# Patient Record
Sex: Female | Born: 1940 | Race: Black or African American | Hispanic: No | Marital: Married | Smoking: Former smoker
Health system: Southern US, Community
[De-identification: ages and names within clinical notes are randomized; demographics above are authoritative.]

## PROBLEM LIST (undated history)

## (undated) DIAGNOSIS — M25649 Stiffness of unspecified hand, not elsewhere classified: Secondary | ICD-10-CM

## (undated) DIAGNOSIS — F419 Anxiety disorder, unspecified: Secondary | ICD-10-CM

## (undated) DIAGNOSIS — I10 Essential (primary) hypertension: Secondary | ICD-10-CM

## (undated) DIAGNOSIS — E1136 Type 2 diabetes mellitus with diabetic cataract: Secondary | ICD-10-CM

## (undated) DIAGNOSIS — S40029A Contusion of unspecified upper arm, initial encounter: Secondary | ICD-10-CM

## (undated) DIAGNOSIS — E785 Hyperlipidemia, unspecified: Secondary | ICD-10-CM

## (undated) DIAGNOSIS — K222 Esophageal obstruction: Secondary | ICD-10-CM

## (undated) DIAGNOSIS — I2510 Atherosclerotic heart disease of native coronary artery without angina pectoris: Secondary | ICD-10-CM

## (undated) DIAGNOSIS — E119 Type 2 diabetes mellitus without complications: Secondary | ICD-10-CM

## (undated) DIAGNOSIS — R5383 Other fatigue: Secondary | ICD-10-CM

## (undated) DIAGNOSIS — M1990 Unspecified osteoarthritis, unspecified site: Secondary | ICD-10-CM

## (undated) DIAGNOSIS — N189 Chronic kidney disease, unspecified: Secondary | ICD-10-CM

## (undated) DIAGNOSIS — R12 Heartburn: Secondary | ICD-10-CM

## (undated) DIAGNOSIS — D649 Anemia, unspecified: Secondary | ICD-10-CM

## (undated) DIAGNOSIS — Z8041 Family history of malignant neoplasm of ovary: Secondary | ICD-10-CM

## (undated) DIAGNOSIS — I219 Acute myocardial infarction, unspecified: Secondary | ICD-10-CM

## (undated) DIAGNOSIS — K449 Diaphragmatic hernia without obstruction or gangrene: Secondary | ICD-10-CM

## (undated) DIAGNOSIS — Z9289 Personal history of other medical treatment: Secondary | ICD-10-CM

## (undated) DIAGNOSIS — R0600 Dyspnea, unspecified: Secondary | ICD-10-CM

## (undated) HISTORY — DX: Diaphragmatic hernia without obstruction or gangrene: K44.9

## (undated) HISTORY — DX: Atherosclerotic heart disease of native coronary artery without angina pectoris: I25.10

## (undated) HISTORY — DX: Type 2 diabetes mellitus with diabetic cataract: E11.36

## (undated) HISTORY — DX: Hyperlipidemia, unspecified: E78.5

## (undated) HISTORY — DX: Contusion of unspecified upper arm, initial encounter: S40.029A

## (undated) HISTORY — DX: Family history of malignant neoplasm of ovary: Z80.41

## (undated) HISTORY — PX: PARTIAL HYSTERECTOMY: SHX80

## (undated) HISTORY — DX: Stiffness of unspecified hand, not elsewhere classified: M25.649

## (undated) HISTORY — DX: Personal history of other medical treatment: Z92.89

## (undated) HISTORY — DX: Other fatigue: R53.83

## (undated) HISTORY — DX: Type 2 diabetes mellitus without complications: E11.9

## (undated) HISTORY — DX: Unspecified osteoarthritis, unspecified site: M19.90

## (undated) HISTORY — DX: Esophageal obstruction: K22.2

## (undated) HISTORY — DX: Heartburn: R12

## (undated) HISTORY — DX: Anemia, unspecified: D64.9

## (undated) HISTORY — PX: CATARACT EXTRACTION: SUR2

---

## 1996-03-31 HISTORY — PX: CHOLECYSTECTOMY: SHX55

## 2002-12-30 HISTORY — PX: CORONARY ANGIOPLASTY WITH STENT PLACEMENT: SHX49

## 2002-12-30 HISTORY — PX: CARDIAC CATHETERIZATION: SHX172

## 2005-03-23 ENCOUNTER — Emergency Department (HOSPITAL_COMMUNITY): Admission: EM | Admit: 2005-03-23 | Discharge: 2005-03-23 | Payer: Self-pay | Admitting: *Deleted

## 2005-05-16 HISTORY — PX: OTHER SURGICAL HISTORY: SHX169

## 2005-08-06 ENCOUNTER — Ambulatory Visit: Payer: Self-pay | Admitting: Family Medicine

## 2005-10-06 ENCOUNTER — Ambulatory Visit: Payer: Self-pay | Admitting: Family Medicine

## 2005-10-08 ENCOUNTER — Ambulatory Visit: Payer: Self-pay | Admitting: Family Medicine

## 2006-01-08 ENCOUNTER — Observation Stay (HOSPITAL_COMMUNITY): Admission: EM | Admit: 2006-01-08 | Discharge: 2006-01-08 | Payer: Self-pay | Admitting: Emergency Medicine

## 2006-02-12 ENCOUNTER — Ambulatory Visit: Payer: Self-pay | Admitting: Family Medicine

## 2006-02-12 ENCOUNTER — Encounter (INDEPENDENT_AMBULATORY_CARE_PROVIDER_SITE_OTHER): Payer: Self-pay | Admitting: Nurse Practitioner

## 2006-02-13 ENCOUNTER — Encounter (INDEPENDENT_AMBULATORY_CARE_PROVIDER_SITE_OTHER): Payer: Self-pay | Admitting: Nurse Practitioner

## 2006-02-13 LAB — CONVERTED CEMR LAB
Cholesterol: 207 mg/dL
LDL Cholesterol: 128 mg/dL
VLDL: 22 mg/dL

## 2006-02-16 ENCOUNTER — Ambulatory Visit: Payer: Self-pay | Admitting: Family Medicine

## 2006-02-23 ENCOUNTER — Encounter: Admission: RE | Admit: 2006-02-23 | Discharge: 2006-02-23 | Payer: Self-pay | Admitting: Family Medicine

## 2006-02-24 ENCOUNTER — Ambulatory Visit: Payer: Self-pay | Admitting: Family Medicine

## 2006-12-04 ENCOUNTER — Ambulatory Visit: Payer: Self-pay | Admitting: Internal Medicine

## 2006-12-04 ENCOUNTER — Encounter (INDEPENDENT_AMBULATORY_CARE_PROVIDER_SITE_OTHER): Payer: Self-pay | Admitting: Nurse Practitioner

## 2006-12-04 LAB — CONVERTED CEMR LAB
ALT: 15 units/L (ref 0–35)
Alkaline Phosphatase: 52 units/L (ref 39–117)
Basophils Absolute: 0 10*3/uL (ref 0.0–0.1)
CO2: 22 meq/L (ref 19–32)
Cholesterol: 180 mg/dL (ref 0–200)
Creatinine, Ser: 1.21 mg/dL — ABNORMAL HIGH (ref 0.40–1.20)
Eosinophils Absolute: 0.3 10*3/uL (ref 0.0–0.7)
Eosinophils Relative: 8 % — ABNORMAL HIGH (ref 0–5)
HCT: 36 % (ref 36.0–46.0)
Hemoglobin: 11.5 g/dL — ABNORMAL LOW (ref 12.0–15.0)
LDL Cholesterol: 92 mg/dL (ref 0–99)
MCHC: 31.9 g/dL (ref 30.0–36.0)
MCV: 89.1 fL (ref 78.0–100.0)
Monocytes Absolute: 0.3 10*3/uL (ref 0.2–0.7)
Platelets: 181 10*3/uL (ref 150–400)
RDW: 14.4 % — ABNORMAL HIGH (ref 11.5–14.0)
Sodium: 142 meq/L (ref 135–145)
Total Bilirubin: 0.3 mg/dL (ref 0.3–1.2)
Total CHOL/HDL Ratio: 2.6
Total Protein: 7.4 g/dL (ref 6.0–8.3)
Triglycerides: 101 mg/dL (ref <150)
VLDL: 20 mg/dL (ref 0–40)

## 2006-12-11 ENCOUNTER — Encounter (INDEPENDENT_AMBULATORY_CARE_PROVIDER_SITE_OTHER): Payer: Self-pay | Admitting: Nurse Practitioner

## 2006-12-11 DIAGNOSIS — E1139 Type 2 diabetes mellitus with other diabetic ophthalmic complication: Secondary | ICD-10-CM

## 2006-12-11 DIAGNOSIS — I10 Essential (primary) hypertension: Secondary | ICD-10-CM

## 2006-12-11 DIAGNOSIS — I2510 Atherosclerotic heart disease of native coronary artery without angina pectoris: Secondary | ICD-10-CM

## 2006-12-11 DIAGNOSIS — E785 Hyperlipidemia, unspecified: Secondary | ICD-10-CM | POA: Insufficient documentation

## 2006-12-11 DIAGNOSIS — R5383 Other fatigue: Secondary | ICD-10-CM

## 2006-12-11 DIAGNOSIS — R5381 Other malaise: Secondary | ICD-10-CM | POA: Insufficient documentation

## 2006-12-11 DIAGNOSIS — E1136 Type 2 diabetes mellitus with diabetic cataract: Secondary | ICD-10-CM | POA: Insufficient documentation

## 2006-12-11 DIAGNOSIS — E119 Type 2 diabetes mellitus without complications: Secondary | ICD-10-CM

## 2006-12-25 ENCOUNTER — Ambulatory Visit: Payer: Self-pay | Admitting: Nurse Practitioner

## 2007-02-26 ENCOUNTER — Encounter: Admission: RE | Admit: 2007-02-26 | Discharge: 2007-02-26 | Payer: Self-pay | Admitting: Family Medicine

## 2007-04-12 ENCOUNTER — Encounter (INDEPENDENT_AMBULATORY_CARE_PROVIDER_SITE_OTHER): Payer: Self-pay | Admitting: Nurse Practitioner

## 2007-05-06 ENCOUNTER — Ambulatory Visit: Payer: Self-pay | Admitting: Nurse Practitioner

## 2007-05-06 LAB — CONVERTED CEMR LAB
Blood Glucose, Fingerstick: 114
Hgb A1c MFr Bld: 6.2 %

## 2007-08-05 ENCOUNTER — Ambulatory Visit: Payer: Self-pay | Admitting: Nurse Practitioner

## 2007-08-05 LAB — CONVERTED CEMR LAB
ALT: 19 units/L (ref 0–35)
AST: 24 units/L (ref 0–37)
Alkaline Phosphatase: 46 units/L (ref 39–117)
BUN: 21 mg/dL (ref 6–23)
Basophils Absolute: 0 10*3/uL (ref 0.0–0.1)
Basophils Relative: 0 % (ref 0–1)
Blood Glucose, Fingerstick: 86
Calcium: 10.1 mg/dL (ref 8.4–10.5)
Chloride: 103 meq/L (ref 96–112)
Creatinine, Ser: 1.12 mg/dL (ref 0.40–1.20)
Eosinophils Absolute: 0.2 10*3/uL (ref 0.0–0.7)
Eosinophils Relative: 3 % (ref 0–5)
HCT: 37.7 % (ref 36.0–46.0)
HDL: 94 mg/dL (ref >39)
Hemoglobin: 12 g/dL (ref 12.0–15.0)
LDL Goal: 70 mg/dL
MCHC: 31.8 g/dL (ref 30.0–36.0)
MCV: 90.2 fL (ref 78.0–100.0)
Monocytes Absolute: 0.4 10*3/uL (ref 0.1–1.0)
Monocytes Relative: 7 % (ref 3–12)
RBC: 4.18 M/uL (ref 3.87–5.11)
RDW: 14.4 % (ref 11.5–15.5)
Total Bilirubin: 0.4 mg/dL (ref 0.3–1.2)
Total CHOL/HDL Ratio: 2
VLDL: 14 mg/dL (ref 0–40)

## 2007-08-06 ENCOUNTER — Encounter (INDEPENDENT_AMBULATORY_CARE_PROVIDER_SITE_OTHER): Payer: Self-pay | Admitting: Nurse Practitioner

## 2007-09-16 ENCOUNTER — Encounter (INDEPENDENT_AMBULATORY_CARE_PROVIDER_SITE_OTHER): Payer: Self-pay | Admitting: Nurse Practitioner

## 2007-12-14 ENCOUNTER — Ambulatory Visit: Payer: Self-pay | Admitting: Nurse Practitioner

## 2007-12-14 LAB — CONVERTED CEMR LAB
Blood Glucose, Fingerstick: 92
Hgb A1c MFr Bld: 6.6 %

## 2008-01-18 ENCOUNTER — Encounter (INDEPENDENT_AMBULATORY_CARE_PROVIDER_SITE_OTHER): Payer: Self-pay | Admitting: Nurse Practitioner

## 2008-02-07 ENCOUNTER — Observation Stay (HOSPITAL_COMMUNITY): Admission: EM | Admit: 2008-02-07 | Discharge: 2008-02-08 | Payer: Self-pay | Admitting: Emergency Medicine

## 2008-02-07 ENCOUNTER — Ambulatory Visit: Payer: Self-pay | Admitting: Cardiovascular Disease

## 2008-02-08 ENCOUNTER — Encounter (INDEPENDENT_AMBULATORY_CARE_PROVIDER_SITE_OTHER): Payer: Self-pay | Admitting: Nurse Practitioner

## 2008-02-28 ENCOUNTER — Encounter: Admission: RE | Admit: 2008-02-28 | Discharge: 2008-02-28 | Payer: Self-pay | Admitting: Internal Medicine

## 2008-03-08 ENCOUNTER — Encounter (INDEPENDENT_AMBULATORY_CARE_PROVIDER_SITE_OTHER): Payer: Self-pay | Admitting: Nurse Practitioner

## 2008-03-08 DIAGNOSIS — D649 Anemia, unspecified: Secondary | ICD-10-CM

## 2008-03-14 ENCOUNTER — Ambulatory Visit: Payer: Self-pay | Admitting: Nurse Practitioner

## 2008-03-14 DIAGNOSIS — M129 Arthropathy, unspecified: Secondary | ICD-10-CM | POA: Insufficient documentation

## 2008-03-14 LAB — CONVERTED CEMR LAB

## 2008-03-16 ENCOUNTER — Ambulatory Visit: Payer: Self-pay | Admitting: Cardiovascular Disease

## 2008-03-20 ENCOUNTER — Encounter: Admission: RE | Admit: 2008-03-20 | Discharge: 2008-03-20 | Payer: Self-pay | Admitting: Internal Medicine

## 2008-03-20 DIAGNOSIS — E559 Vitamin D deficiency, unspecified: Secondary | ICD-10-CM | POA: Insufficient documentation

## 2008-03-20 LAB — CONVERTED CEMR LAB
Basophils Absolute: 0 10*3/uL (ref 0.0–0.1)
Eosinophils Absolute: 0.3 10*3/uL (ref 0.0–0.7)
Eosinophils Relative: 5 % (ref 0–5)
HCT: 38.1 % (ref 36.0–46.0)
Hemoglobin: 12.1 g/dL (ref 12.0–15.0)
Lymphocytes Relative: 46 % (ref 12–46)
Lymphs Abs: 2.1 10*3/uL (ref 0.7–4.0)
MCV: 89.2 fL (ref 78.0–100.0)
Monocytes Absolute: 0.3 10*3/uL (ref 0.1–1.0)
RDW: 14.9 % (ref 11.5–15.5)

## 2008-03-27 ENCOUNTER — Encounter (INDEPENDENT_AMBULATORY_CARE_PROVIDER_SITE_OTHER): Payer: Self-pay | Admitting: Nurse Practitioner

## 2008-06-15 ENCOUNTER — Ambulatory Visit: Payer: Self-pay | Admitting: Nurse Practitioner

## 2008-06-15 DIAGNOSIS — R32 Unspecified urinary incontinence: Secondary | ICD-10-CM | POA: Insufficient documentation

## 2008-06-15 DIAGNOSIS — M25649 Stiffness of unspecified hand, not elsewhere classified: Secondary | ICD-10-CM | POA: Insufficient documentation

## 2008-06-15 LAB — CONVERTED CEMR LAB
Bilirubin Urine: NEGATIVE
Blood in Urine, dipstick: NEGATIVE
Glucose, Urine, Semiquant: NEGATIVE
Microalb, Ur: 0.5 mg/dL (ref 0.00–1.89)
Protein, U semiquant: NEGATIVE
pH: 5

## 2008-06-16 ENCOUNTER — Encounter (INDEPENDENT_AMBULATORY_CARE_PROVIDER_SITE_OTHER): Payer: Self-pay | Admitting: Nurse Practitioner

## 2008-06-16 LAB — CONVERTED CEMR LAB
Rhuematoid fact SerPl-aCnc: <20 intl units/mL (ref 0–20)
Sed Rate: 14 mm/hr (ref 0–22)
Uric Acid, Serum: 6.6 mg/dL (ref 2.4–7.0)

## 2008-06-19 ENCOUNTER — Encounter (INDEPENDENT_AMBULATORY_CARE_PROVIDER_SITE_OTHER): Payer: Self-pay | Admitting: Nurse Practitioner

## 2008-06-23 ENCOUNTER — Telehealth (INDEPENDENT_AMBULATORY_CARE_PROVIDER_SITE_OTHER): Payer: Self-pay | Admitting: Nurse Practitioner

## 2008-07-13 ENCOUNTER — Encounter (INDEPENDENT_AMBULATORY_CARE_PROVIDER_SITE_OTHER): Payer: Self-pay | Admitting: Nurse Practitioner

## 2008-07-20 ENCOUNTER — Ambulatory Visit: Payer: Self-pay | Admitting: Nurse Practitioner

## 2008-07-20 DIAGNOSIS — S40029A Contusion of unspecified upper arm, initial encounter: Secondary | ICD-10-CM | POA: Insufficient documentation

## 2008-11-03 ENCOUNTER — Ambulatory Visit: Payer: Self-pay | Admitting: Nurse Practitioner

## 2008-11-03 DIAGNOSIS — K5900 Constipation, unspecified: Secondary | ICD-10-CM | POA: Insufficient documentation

## 2008-11-03 LAB — CONVERTED CEMR LAB
Albumin: 4.4 g/dL (ref 3.5–5.2)
Alkaline Phosphatase: 48 units/L (ref 39–117)
BUN: 23 mg/dL (ref 6–23)
Basophils Absolute: 0 10*3/uL (ref 0.0–0.1)
Basophils Relative: 0 % (ref 0–1)
Eosinophils Relative: 5 % (ref 0–5)
Glucose, Bld: 70 mg/dL (ref 70–99)
HCT: 34.5 % — ABNORMAL LOW (ref 36.0–46.0)
HDL: 75 mg/dL (ref >39)
Hemoglobin: 11 g/dL — ABNORMAL LOW (ref 12.0–15.0)
LDL Cholesterol: 75 mg/dL (ref 0–99)
Lymphocytes Relative: 43 % (ref 12–46)
MCHC: 31.9 g/dL (ref 30.0–36.0)
MCV: 92.2 fL (ref 78.0–100.0)
Monocytes Absolute: 0.4 10*3/uL (ref 0.1–1.0)
Potassium: 4 meq/L (ref 3.5–5.3)
RDW: 14.2 % (ref 11.5–15.5)
Triglycerides: 79 mg/dL (ref <150)

## 2008-11-06 ENCOUNTER — Encounter (INDEPENDENT_AMBULATORY_CARE_PROVIDER_SITE_OTHER): Payer: Self-pay | Admitting: Nurse Practitioner

## 2009-01-30 ENCOUNTER — Ambulatory Visit: Payer: Self-pay | Admitting: Nurse Practitioner

## 2009-03-06 ENCOUNTER — Ambulatory Visit: Payer: Self-pay | Admitting: Cardiovascular Disease

## 2009-04-25 ENCOUNTER — Emergency Department (HOSPITAL_COMMUNITY)
Admission: EM | Admit: 2009-04-25 | Discharge: 2009-04-25 | Payer: Self-pay | Source: Home / Self Care | Admitting: Emergency Medicine

## 2009-05-10 ENCOUNTER — Encounter: Admission: RE | Admit: 2009-05-10 | Discharge: 2009-05-10 | Payer: Self-pay | Admitting: Internal Medicine

## 2009-05-31 ENCOUNTER — Ambulatory Visit: Payer: Self-pay | Admitting: Nurse Practitioner

## 2009-05-31 DIAGNOSIS — Z780 Asymptomatic menopausal state: Secondary | ICD-10-CM | POA: Insufficient documentation

## 2009-05-31 DIAGNOSIS — M25519 Pain in unspecified shoulder: Secondary | ICD-10-CM

## 2009-05-31 DIAGNOSIS — N3000 Acute cystitis without hematuria: Secondary | ICD-10-CM

## 2009-05-31 LAB — CONVERTED CEMR LAB
Bilirubin Urine: NEGATIVE
Blood in Urine, dipstick: NEGATIVE
Glucose, Urine, Semiquant: NEGATIVE
Hgb A1c MFr Bld: 6.5 %
Ketones, urine, test strip: NEGATIVE
Nitrite: POSITIVE
Specific Gravity, Urine: <1.005
pH: 7

## 2009-06-01 ENCOUNTER — Encounter (INDEPENDENT_AMBULATORY_CARE_PROVIDER_SITE_OTHER): Payer: Self-pay | Admitting: Nurse Practitioner

## 2009-06-01 LAB — CONVERTED CEMR LAB: Microalb, Ur: 0.5 mg/dL (ref 0.00–1.89)

## 2009-08-10 ENCOUNTER — Emergency Department (HOSPITAL_COMMUNITY): Admission: EM | Admit: 2009-08-10 | Discharge: 2009-08-10 | Payer: Self-pay | Admitting: Emergency Medicine

## 2009-11-05 ENCOUNTER — Telehealth (INDEPENDENT_AMBULATORY_CARE_PROVIDER_SITE_OTHER): Payer: Self-pay | Admitting: Nurse Practitioner

## 2009-11-13 ENCOUNTER — Encounter (INDEPENDENT_AMBULATORY_CARE_PROVIDER_SITE_OTHER): Payer: Self-pay | Admitting: Nurse Practitioner

## 2009-12-10 ENCOUNTER — Telehealth (INDEPENDENT_AMBULATORY_CARE_PROVIDER_SITE_OTHER): Payer: Self-pay | Admitting: Nurse Practitioner

## 2009-12-13 ENCOUNTER — Telehealth (INDEPENDENT_AMBULATORY_CARE_PROVIDER_SITE_OTHER): Payer: Self-pay | Admitting: Nurse Practitioner

## 2009-12-14 ENCOUNTER — Telehealth (INDEPENDENT_AMBULATORY_CARE_PROVIDER_SITE_OTHER): Payer: Self-pay | Admitting: Nurse Practitioner

## 2010-01-31 ENCOUNTER — Telehealth (INDEPENDENT_AMBULATORY_CARE_PROVIDER_SITE_OTHER): Payer: Self-pay | Admitting: Nurse Practitioner

## 2010-02-04 ENCOUNTER — Telehealth (INDEPENDENT_AMBULATORY_CARE_PROVIDER_SITE_OTHER): Payer: Self-pay | Admitting: Nurse Practitioner

## 2010-02-13 ENCOUNTER — Ambulatory Visit: Payer: Self-pay | Admitting: Nurse Practitioner

## 2010-02-13 LAB — CONVERTED CEMR LAB
Blood Glucose, Fingerstick: 79
Microalb, Ur: 0.5 mg/dL (ref 0.00–1.89)

## 2010-03-07 ENCOUNTER — Encounter: Payer: Self-pay | Admitting: Cardiovascular Disease

## 2010-03-07 ENCOUNTER — Ambulatory Visit: Payer: Self-pay | Admitting: Cardiovascular Disease

## 2010-03-15 ENCOUNTER — Ambulatory Visit: Payer: Self-pay | Admitting: Nurse Practitioner

## 2010-03-27 ENCOUNTER — Telehealth (INDEPENDENT_AMBULATORY_CARE_PROVIDER_SITE_OTHER): Payer: Self-pay | Admitting: Radiology

## 2010-03-28 ENCOUNTER — Ambulatory Visit (HOSPITAL_COMMUNITY)
Admission: RE | Admit: 2010-03-28 | Discharge: 2010-03-28 | Payer: Self-pay | Source: Home / Self Care | Attending: Cardiovascular Disease | Admitting: Cardiovascular Disease

## 2010-03-28 ENCOUNTER — Ambulatory Visit: Payer: Self-pay

## 2010-04-21 ENCOUNTER — Encounter: Payer: Self-pay | Admitting: Internal Medicine

## 2010-04-30 NOTE — Medication Information (Signed)
Summary: DIABETIC SUPPLIES  DIABETIC SUPPLIES   Imported By: Roland Earl 11/13/2009 10:59:47  _____________________________________________________________________  External Attachment:    Type:   Image     Comment:   External Document

## 2010-04-30 NOTE — Letter (Signed)
Summary: TEST ORDER FORM //DEXA SCAN//APPT DATE & TIME  TEST ORDER FORM //DEXA SCAN//APPT DATE & TIME   Imported By: Roland Earl 07/26/2009 13:00:31  _____________________________________________________________________  External Attachment:    Type:   Image     Comment:   External Document

## 2010-04-30 NOTE — Progress Notes (Signed)
Summary: CHECKING ON OLD REF  Phone Note Call from Patient Call back at Home Phone [phone]   Reason for Call: Referral Summary of Call: [facility] pt. MR Nowack STOPPED BY WITH OUR REFERRAL PAPER FROM OUR OFFICE FOR HIS WIFE TO GO TO [facility] 06/07/[facility] AT 9:15 FOR A APPT. FOR POSTMENOPAUSAL STATUS. HE SAYS SHE DIDNT GO  BECAUSE SHE WAS NOT FEELING WELL, BUT WHEN I CALLED [facility], THEY SAID SHE NEVER HAD AN APPT. FOR THIS. Initial call taken by: Roberto Scales,  December 13, 2009 10:56 AM  Follow-up for Phone Call        forward to N. Hassell Done, fnp Follow-up by: Isla Pence,  December 14, 2009 12:43 PM  Additional Follow-up for Phone Call Additional follow up Details #1::        Another phone note open regarding this issue - requested that pt schedule an appt. i will discuss with her then Additional Follow-up by: Aurora Mask FNP,  December 14, 2009 3:05 PM    Additional Follow-up for Phone Call Additional follow up Details #2::    Isla Pence  December 17, 2009 4:41 PM Left message on machine for pt to return call to the office.  [facility]  December 17, 2009 5:37 PM   Isla Pence  December 18, 2009 2:29 PM Left message with person that answered to have pt return call to  the office. pt aware Rhunette Croft  December 19, 2009 9:29 AM

## 2010-04-30 NOTE — Progress Notes (Signed)
Summary: Query:  Refill Ibuprofen?  Phone Note Outgoing Call   Summary of Call: Refill Ibuprofen? And if so, how many refills?  Last seen 5/11. Initial call taken by: Sherian Maroon RN,  February 04, 2010 5:26 PM  Follow-up for Phone Call        ok to refill.  pt should have an upcoming appt Follow-up by: Aurora Mask FNP,  February 04, 2010 5:37 PM  Additional Follow-up for Phone Call Additional follow up Details #1::        Noted.  Has appt. 02/13/10.  Refill completed.  Kynzlee Kassner RN  February 04, 2010 5:45 PM
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
## 2010-04-30 NOTE — Progress Notes (Signed)
Summary: Query:  Refill Miralax?  Phone Note Outgoing Call   Summary of Call: Do you want her Miralax refilled?  Last seen 05/2009.  Jacquella Qiao RN  December 10, 2009 1:00 PM   Follow-up for Phone Call        yes, ok to refill Follow-up by: Aurora Mask FNP,  December 10, 2009 2:15 PM  Additional Follow-up for Phone Call Additional follow up Details #1::        Noted and refilled.  Rawda Benally RN  December 10, 2009 2:23 PM

## 2010-04-30 NOTE — Progress Notes (Signed)
Summary: Needs metformin and oxybutynin refilled  Phone Note Outgoing Call   Call placed by: Sherian Maroon RN,  November 05, 2009 11:11 AM Summary of Call: Do you want to refill her oxybutynin and metformin?  Has not been in since 05/2009 Initial call taken by: Sherian Maroon RN,  November 05, 2009 11:12 AM  Follow-up for Phone Call        yes, ok to refill Follow-up by: Aurora Mask FNP,  November 05, 2009 12:27 PM    Prescriptions: OXYBUTYNIN CHLORIDE 5 MG TABS (OXYBUTYNIN CHLORIDE) One tablet by mouth two times a day for bladder  #60 Tablet x 4   Entered by:   Sherian Maroon RN   Authorized by:   Aurora Mask FNP   Signed by:   Sherian Maroon RN on 11/05/2009   Method used:   Electronically to        [facility] [address] [phone]* (retail)       [address]       [place], [place]  [address]       Ph: [phone] or [phone]       Fax: [phone]   RxID:   [id number]
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
## 2010-04-30 NOTE — Assessment & Plan Note (Signed)
Summary: f1y   Visit Type:  1  year follow up Primary Provider:  Aurora Mask FNP  CC:  Some chest pains.  History of Present Illness: 70 yr old woman with hx of CAD and prior stenting who presents today for follow-up evaluation. She describes chest pain with heavy exertion or emotional stress. This is longstanding and has not worsened since she was seen last year. She had a negative Myoview stress test at that time.  She has taken nitroglycerin on 3 occasions since her visit last year. She denies dyspnea, orthopnea, PND, palpitations, lightheadedness, or syncope. She reports good control of her lipids and blood pressure.  Reports chest pain with emotional stress, pattern unchanged from previous years. Also 'tiredness' with exertion. No clear exertional chest pain but does have exertional dyspnea. She has taken about 5 sublingual NTG over the past year and this provides relief of her symptoms. No edema, palpitations, or other complaints.  Current Medications (verified): 1)  Crestor 10 Mg  Tabs (Rosuvastatin Calcium) .... Take 1 Tablet By Mouth Nightly For Cholesterol 2)  Nitroglycerin 0.4 Mg Subl (Nitroglycerin) .... Take One Tablet At Onset of Chest Pain. May Repeat Every 5 Minutes Up To 3 Dose. If Unreleaved Cal 911 3)  Metformin Hcl 500 Mg  Tabs (Metformin Hcl) .... Take 1 Tablet By Mouth Once A Day 4)  Lisinopril-Hydrochlorothiazide 20-12.5 Mg  Tabs (Lisinopril-Hydrochlorothiazide) .... Take 1 Tablet By Mouth Every Morning 5)  Multivitamins   Tabs (Multiple Vitamin) .Marland Kitchen.. 1 Tablet By Mouth Daily 6)  Ecotrin Low Strength 81 Mg  Tbec (Aspirin) .Marland Kitchen.. 1 Tablet By Mouth Daily 7)  Miralax   Powd (Polyethylene Glycol 3350) .Marland Kitchen.. 17gm in 4-8oz of Water or Juice Daily For Stools 8)  Triamcinolone Acetonide 0.1 % Crea (Triamcinolone Acetonide) .Marland Kitchen.. 1 Application Topically Daily To Affected Area 9)  Oxybutynin Chloride 5 Mg Tabs (Oxybutynin Chloride) .... One Tablet By Mouth Two Times A Day For  Bladder 10)  Tylenol Extra Strength 500 Mg Tabs (Acetaminophen) .... One Tablet By Mouth Two Times A Day  Allergies: 1)  ! Pcn  Past History:  Past medical history reviewed for relevance to current acute and chronic problems.  Past Medical History: Reviewed history from 03/02/2009 and no changes required. 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 HEENT: normal Neck: normal carotid upstrokes without bruits, JVP normal Lungs: CTA CV: RRR without murmur or gallop Abd: soft, NT, positive BS, no bruit, no organomegaly Ext: no clubbing, cyanosis, or edema. peripheral pulses 2+ and equal Skin: warm and dry without rash    EKG  Procedure date:  03/07/2010  Findings:      NSR with LVH, HR 77 bpm. nonspecific T wave abnormality  Impression & Recommendations:  Problem # 1:  CORONARY ARTERY DISEASE (ICD-414.00) The patient has chest pain with typical and atypical features. This occurs in a stable pattern. With history of CAD and stenting, recommended a stress echo to rule out significant ischemia. Otherwise  continue current medical program.  Her updated medication list for this problem includes:    Nitroglycerin 0.4 Mg Subl (Nitroglycerin) .Marland Kitchen... Take one tablet at onset of chest pain. may repeat every 5 minutes up to 3 dose. if unreleaved cal 911    Lisinopril-hydrochlorothiazide 20-12.5 Mg Tabs (Lisinopril-hydrochlorothiazide) .Marland Kitchen... Take 1 tablet by mouth every morning    Ecotrin Low Strength 81 Mg Tbec (Aspirin) .Marland Kitchen... 1 tablet by mouth daily  Orders: EKG w/ Interpretation (93000) Stress Echo (Stress Echo)  Problem # 2:  HYPERTENSION (ICD-401.9)  well-controlled.  Her updated medication list for this problem includes:    Lisinopril-hydrochlorothiazide 20-12.5 Mg Tabs (Lisinopril-hydrochlorothiazide) .Marland Kitchen... Take 1 tablet by mouth every morning    Ecotrin Low Strength 81 Mg Tbec (Aspirin) .Marland Kitchen... 1 tablet by mouth daily  BP today: 124/88 Prior BP: 104/76 (02/13/2010)  Prior 10 Yr Risk Heart Disease: N/A (12/25/2006)  Labs Reviewed: K+: 4.0 (11/03/2008) Creat: : 1.28 (11/03/2008)   Chol: 166 (11/03/2008)   HDL: 75 (11/03/2008)   LDL: 75 (11/03/2008)   TG: 79 (11/03/2008)  Orders: EKG w/ Interpretation (93000) Stress Echo (Stress Echo)  Problem # 3:  HYPERLIPIDEMIA (ICD-272.4)  Followed by PCP and has been at goal on statin Rx.  Her updated medication list for this problem includes:    Crestor 10 Mg Tabs (Rosuvastatin calcium) .Marland Kitchen... Take 1 tablet by mouth nightly for cholesterol  CHOL: 166 (11/03/2008)   LDL: 75 (11/03/2008)   HDL: 75 (11/03/2008)   TG: 79 (11/03/2008) CHOL (goal): 200 (08/05/2007)   LDL (goal): 70 (08/05/2007)   HDL (goal): 40 (08/05/2007)   TG (goal): 150 (08/05/2007)  Orders: EKG w/ Interpretation (93000) Stress Echo (Stress Echo)  Patient Instructions: 1)  Your physician recommends that you continue on your current medications as directed. Please refer to the Current Medication list given to you today. 2)  Your physician wants you to follow-up in: 1 YEAR.   You will receive a reminder letter in the mail two months in advance. If you don't receive a letter, please call our office to schedule the follow-up appointment. 3)  Your physician has requested that you have a stress echocardiogram. For further information please visit [email or web address].  Please follow instruction sheet as given.

## 2010-04-30 NOTE — Progress Notes (Signed)
Summary: query Lisinopril/HCT refill  Phone Note Outgoing Call   Summary of Call: Do you want to refill Lisinopril/HCTZ last seen 05/2009? Initial call taken by: Bridgett Larsson RN,  January 31, 2010 11:28 AM  Follow-up for Phone Call        ok to refill Follow-up by: Aurora Mask FNP,  January 31, 2010 11:42 AM  Additional Follow-up for Phone Call Additional follow up Details #1::        refill sent electronically [place] RN  January 31, 2010 2:14 PM     Prescriptions: LISINOPRIL-HYDROCHLOROTHIAZIDE 20-12.5 MG  TABS (LISINOPRIL-HYDROCHLOROTHIAZIDE) Take 1 tablet by mouth every morning  #30 Tablet x 3   Entered by:   Bridgett Larsson RN   Authorized by:   Aurora Mask FNP   Signed by:   Bridgett Larsson RN on 01/31/2010   Method used:   [facility]        [facility] [address] [phone]* (retail)       [address]       [place], [place]  [address]       Ph: [phone] or [phone]       Fax: [phone]   RxID:   [id number]

## 2010-04-30 NOTE — Assessment & Plan Note (Signed)
Summary: Diabetes   Vital Signs:  Patient profile:   70 year old female Menstrual status:  postmenopausal Weight:      136.7 pounds BMI:     27.71 Temp:     97.6 degrees F oral Pulse rate:   88 / minute Pulse rhythm:   regular Resp:     20 per minute BP sitting:   104 / 76  (left arm) Cuff size:   regular  Vitals Entered By: Isla Pence (February 13, 2010 11:21 AM)  Nutrition Counseling: Patient's BMI is greater than 25 and therefore counseled on weight management options. CC: follow-up visit DM...pain in left ear, some sorethroat, and joint pain x 3 weeks, Hypertension Management, Lipid Management Is Patient Diabetic? Yes Pain Assessment Patient in pain? yes     Location: joints, ear CBG Result 79 CBG Device ID B  Does patient need assistance? Functional Status Self care Ambulation Normal   Primary Care Provider:  Aurora Mask FNP  CC:  follow-up visit DM...pain in left ear, some sorethroat, and joint pain x 3 weeks, Hypertension Management, and Lipid Management.  History of Present Illness:  Pt into the office for f/u on diabetes  Diabetes Management History:      The patient is a 70 years old female who comes in for evaluation of Type 2 Diabetes Mellitus.  She has not been enrolled in the "Diabetic Education Program".  She states understanding of dietary principles and is following her diet appropriately.  No sensory loss is reported.  Self foot exams are not being performed.  She is checking home blood sugars.  She says that she is not exercising regularly.        Hypoglycemic symptoms are not occurring.  No hyperglycemic symptoms are reported.        No changes have been made to her treatment plan since last visit.    Hypertension History:      She denies headache, chest pain, and palpitations.  She notes no problems with any antihypertensive medication side effects.        Positive major cardiovascular risk factors include female age 53 years old or  older, diabetes, hyperlipidemia, and hypertension.  Negative major cardiovascular risk factors include non-tobacco-user status.        Positive history for target organ damage include ASHD (either angina/prior MI/prior CABG).  Further assessment for target organ damage reveals no history of cardiac end-organ damage (CHF/LVH), stroke/TIA, peripheral vascular disease, renal insufficiency, or hypertensive retinopathy.    Lipid Management History:      Positive NCEP/ATP III risk factors include female age 53 years old or older, diabetes, hypertension, and ASHD (either angina/prior MI/prior CABG).  Negative NCEP/ATP III risk factors include no history of early menopause without estrogen hormone replacement, HDL cholesterol greater than 60, non-tobacco-user status, no prior stroke/TIA, no peripheral vascular disease, and no history of aortic aneurysm.        The patient states that she knows about the "Therapeutic Lifestyle Change" diet.  Her compliance with the TLC diet is fair.  The patient does not know about adjunctive measures for cholesterol lowering.  She expresses no side effects from her lipid-lowering medication.  Comments include: pt is not fasting today for labs.  The patient denies any symptoms to suggest myopathy or liver disease.      Habits & Providers  Alcohol-Tobacco-Diet     Alcohol drinks/day: <1     Alcohol type: wine     >5/day in last 3 mos: holidays  Tobacco Status: never  Exercise-Depression-Behavior     Does Patient Exercise: no     Drug Use: never     Seat Belt Use: always     Sun Exposure: frequent  Allergies (verified): 1)  ! Pcn  Review of Systems General:  Denies fever. CV:  Denies chest pain or discomfort. GI:  Complains of abdominal pain; denies nausea and vomiting; c/o some mid abdominal irritation that is intermittent.  She is currently out of ibuprofen but admits that she has not felt the problems with her stomach. . MS:  Complains of joint  pain.  Physical Exam  General:  alert.   Head:  normocephalic.   Ears:  ear piercing(s) noted.   Lungs:  normal breath sounds.   Heart:  normal rate and regular rhythm.   Abdomen:  normal bowel sounds.   Msk:  up to the exam table Neurologic:  alert & oriented X3.    Diabetes Management Exam:    Foot Exam (with socks and/or shoes not present):       Sensory-Monofilament:          Left foot: normal          Right foot: normal   Impression & Recommendations:  Problem # 1:  DIABETES MELLITUS, TYPE II (ICD-250.00) Hgba1c = 6.5 pt to continue current medications Her updated medication list for this problem includes:    Metformin Hcl 500 Mg Tabs (Metformin hcl) .Marland Kitchen... Take 1 tablet by mouth once a day    Lisinopril-hydrochlorothiazide 20-12.5 Mg Tabs (Lisinopril-hydrochlorothiazide) .Marland Kitchen... Take 1 tablet by mouth every morning    Ecotrin Low Strength 81 Mg Tbec (Aspirin) .Marland Kitchen... 1 tablet by mouth daily  Orders: UA Dipstick w/o Micro (manual) (81002) T-Urine Microalbumin w/creat. ratio [id number])  Problem # 2:  HYPERTENSION (ICD-401.9) BP is doing well Continue DASH diet Her updated medication list for this problem includes:    Lisinopril-hydrochlorothiazide 20-12.5 Mg Tabs (Lisinopril-hydrochlorothiazide) .Marland Kitchen... Take 1 tablet by mouth every morning  Problem # 3:  NEED PROPHYLACTIC VACCINATION&INOCULATION FLU (ICD-V04.81) given today in office  Problem # 4:  HYPERLIPIDEMIA (ICD-272.4) pt to f/u in 1 month for cholesterol Her updated medication list for this problem includes:    Crestor 10 Mg Tabs (Rosuvastatin calcium) .Marland Kitchen... Take 1 tablet by mouth nightly for cholesterol  Problem # 5:  JOINT STIFFNESS, HAND (ICD-719.54) advised pt to take tylenol instead of ibuprofen  Complete Medication List: 1)  Crestor 10 Mg Tabs (Rosuvastatin calcium) .... Take 1 tablet by mouth nightly for cholesterol 2)  Nitroglycerin 0.4 Mg Subl (Nitroglycerin) .... Take one tablet at onset of  chest pain. may repeat every 5 minutes up to 3 dose. if unreleaved cal 911 3)  Metformin Hcl 500 Mg Tabs (Metformin hcl) .... Take 1 tablet by mouth once a day 4)  Lisinopril-hydrochlorothiazide 20-12.5 Mg Tabs (Lisinopril-hydrochlorothiazide) .... Take 1 tablet by mouth every morning 5)  Multivitamins Tabs (Multiple vitamin) .Marland Kitchen.. 1 tablet by mouth daily 6)  Ecotrin Low Strength 81 Mg Tbec (Aspirin) .Marland Kitchen.. 1 tablet by mouth daily 7)  Miralax Powd (Polyethylene glycol 3350) .Marland Kitchen.. 17gm in 4-8oz of water or juice daily for stools 8)  Triamcinolone Acetonide 0.1 % Crea (Triamcinolone acetonide) .Marland Kitchen.. 1 application topically daily to affected area 9)  Oxybutynin Chloride 5 Mg Tabs (Oxybutynin chloride) .... One tablet by mouth two times a day for bladder 10)  Tylenol Extra Strength 500 Mg Tabs (Acetaminophen) .... One tablet by mouth two times a day  Other Orders: Flu  Vaccine 66yrs + [phone]) Admin 1st Vaccine [phone])  Diabetes Management Assessment/Plan:      The following lipid goals have been established for the patient: Total cholesterol goal of 200; LDL cholesterol goal of 70; HDL cholesterol goal of 40; Triglyceride goal of 150.  Her blood pressure goal is < 130/80.    Hypertension Assessment/Plan:      The patient's hypertensive risk group is category C: Target organ damage and/or diabetes.  Today's blood pressure is 104/76.  Her blood pressure goal is < 130/80.  Lipid Assessment/Plan:      Based on NCEP/ATP III, the patient's risk factor category is "history of coronary disease, peripheral vascular disease, cerebrovascular disease, or aortic aneurysm along with either diabetes, current smoker, or LDL > 130 plus HDL < 40 plus triglycerides > 200".  The patient's lipid goals are as follows: Total cholesterol goal is 200; LDL cholesterol goal is 70; HDL cholesterol goal is 40; Triglyceride goal is 150.    Patient Instructions: 1)  You have recieved the flu vaccine today. 2)  You will need to  schedule a fasting lab visit for lipids, cmet, cbc, tsh.  No food after midnight before this visit. 3)  Joint pain - STOP taking ibuprofen as this may be irritating your stomach.  Take tylenol Extra Strength 500mg  - may take one tablet by mouth two times a day OR Two tablets daily in the morning. 4)  Follow up in 3 months for diabetes and high blood pressure or sooner if necessary. Prescriptions: NITROGLYCERIN 0.4 MG SUBL (NITROGLYCERIN) Take one tablet at onset of chest pain. May repeat every 5 minutes up to 3 dose. If unreleaved cal 911  #25 x 0   Entered and Authorized by:   Aurora Mask FNP   Signed by:   Aurora Mask FNP on 02/13/2010   Method used:   [facility]        [facility] [address] [phone]* (retail)       [address]       [place], [place]  [address]       Ph: [phone] or [phone]       Fax: [phone]   RxID:   [id number]    Orders Added: 1)  Flu Vaccine 10yrs + [id number] 2)  Admin 1st Vaccine [id number] 3)  Est. Patient Level III [id number] 4)  UA Dipstick w/o Micro (manual) [81002] 5)  T-Urine Microalbumin w/creat. ratio [[id number]]   Immunizations Administered:  Influenza Vaccine # 1:    Vaccine Type: Fluvax 3+    Site: right deltoid    Mfr: GlaxoSmithKline    Dose: 0.5 ml    Route: IM    Given by: Isla Pence    Exp. Date: 09/28/2010    Lot #: [id number]    VIS given: 10/23/09 version given February 13, 2010.  Flu Vaccine Consent Questions:    Do you have a history of severe allergic reactions to this vaccine? no    Any prior history of allergic reactions to egg and/or gelatin? no    Do you have a sensitivity to the preservative Thimersol? no    Do you have a past history of Guillan-Barre Syndrome? no    Do you currently have an acute febrile illness? no    Have you ever had a severe reaction to latex? no    Vaccine information given and explained to patient? yes    Are you currently pregnant? no  ndc  [phone]  Immunizations Administered:  Influenza Vaccine # 1:    Vaccine Type: Fluvax 3+    Site: right deltoid    Mfr: GlaxoSmithKline    Dose: 0.5 ml    Route: IM    Given by: Isla Pence    Exp. Date: 09/28/2010    Lot #: [id number]    VIS given: 10/23/09 version given February 13, 2010.    Last LDL:                                                 75 (11/03/2008 9:37:00 PM)          Diabetic Foot Exam    10-g (5.07) Semmes-Weinstein Monofilament Test Performed by: Isla Pence          Right Foot          Left Foot Visual Inspection               Test Control      normal         normal Site 1         normal         normal Site 2         normal         normal Site 3         normal         normal Site 4         normal         normal Site 5         normal         normal Site 6         normal         normal Site 7         normal         normal Site 8         normal         normal Site 9         normal         normal Site 10         normal         normal  Impression      normal         normal   Appended Document: Diabetes     Allergies: 1)  ! Pcn   Complete Medication List: 1)  Crestor 10 Mg Tabs (Rosuvastatin calcium) .... Take 1 tablet by mouth nightly for cholesterol 2)  Nitroglycerin 0.4 Mg Subl (Nitroglycerin) .... Take one tablet at onset of chest pain. may repeat every 5 minutes up to 3 dose. if unreleaved cal 911 3)  Metformin Hcl 500 Mg Tabs (Metformin hcl) .... Take 1 tablet by mouth once a day 4)  Lisinopril-hydrochlorothiazide 20-12.5 Mg Tabs (Lisinopril-hydrochlorothiazide) .... Take 1 tablet by mouth every morning 5)  Multivitamins Tabs (Multiple vitamin) .Marland Kitchen.. 1 tablet by mouth daily 6)  Ecotrin Low Strength 81 Mg Tbec (Aspirin) .Marland Kitchen.. 1 tablet by mouth daily 7)  Miralax Powd (Polyethylene glycol 3350) .Marland Kitchen.. 17gm in 4-8oz of water or juice daily for stools 8)  Triamcinolone Acetonide 0.1 % Crea (Triamcinolone acetonide) .Marland Kitchen.. 1  application topically daily to affected area 9)  Oxybutynin Chloride 5 Mg Tabs (Oxybutynin chloride) .... One tablet by mouth two times  a day for bladder 10)  Tylenol Extra Strength 500 Mg Tabs (Acetaminophen) .... One tablet by mouth two times a day      Laboratory Results   Urine Tests  Date/Time Received: February 13, 2010 1:14 PM   Routine Urinalysis   Color: lt. yellow Appearance: Clear Glucose: negative   (Normal Range: Negative) Bilirubin: negative   (Normal Range: Negative) Ketone: negative   (Normal Range: Negative) Spec. Gravity: <1.005   (Normal Range: 1.003-1.035) Blood: negative   (Normal Range: Negative) pH: 6.0   (Normal Range: 5.0-8.0) Protein: negative   (Normal Range: Negative) Urobilinogen: 0.2   (Normal Range: 0-1) Nitrite: negative   (Normal Range: Negative) Leukocyte Esterace: negative   (Normal Range: Negative)

## 2010-05-02 NOTE — Progress Notes (Signed)
Summary: stress echo pre-procedure  Phone Note Outgoing Call   Call placed by: Charlton Amor, CNMT,  March 27, 2010 3:04 PM Call placed to: Patient Reason for Call: Confirm/change Appt Summary of Call: Spoke with patient about stress echo appt.

## 2010-05-07 ENCOUNTER — Telehealth (INDEPENDENT_AMBULATORY_CARE_PROVIDER_SITE_OTHER): Payer: Self-pay | Admitting: *Deleted

## 2010-05-07 ENCOUNTER — Other Ambulatory Visit: Payer: Self-pay | Admitting: Nurse Practitioner

## 2010-05-07 DIAGNOSIS — Z1231 Encounter for screening mammogram for malignant neoplasm of breast: Secondary | ICD-10-CM

## 2010-05-08 ENCOUNTER — Encounter: Payer: Self-pay | Admitting: Cardiology

## 2010-05-08 ENCOUNTER — Ambulatory Visit (HOSPITAL_COMMUNITY): Payer: Medicare Other | Attending: Cardiovascular Disease

## 2010-05-08 DIAGNOSIS — I2510 Atherosclerotic heart disease of native coronary artery without angina pectoris: Secondary | ICD-10-CM | POA: Insufficient documentation

## 2010-05-08 DIAGNOSIS — I4949 Other premature depolarization: Secondary | ICD-10-CM

## 2010-05-08 DIAGNOSIS — I491 Atrial premature depolarization: Secondary | ICD-10-CM

## 2010-05-08 DIAGNOSIS — R079 Chest pain, unspecified: Secondary | ICD-10-CM

## 2010-05-15 ENCOUNTER — Ambulatory Visit
Admission: RE | Admit: 2010-05-15 | Discharge: 2010-05-15 | Disposition: A | Discharge status: Home / Self Care | Payer: Medicare Other | Source: Ambulatory Visit | Attending: Nurse Practitioner | Admitting: Nurse Practitioner

## 2010-05-15 DIAGNOSIS — Z1231 Encounter for screening mammogram for malignant neoplasm of breast: Secondary | ICD-10-CM

## 2010-05-16 NOTE — Progress Notes (Signed)
Summary: nuc pre procedure  Phone Note Outgoing Call Call back at Home Phone [phone]   Call placed by: Matilde Haymaker RN,  May 07, 2010 2:47 PM Call placed to: Patient Reason for Call: Confirm/change Appt Summary of Call: Reviewed information on Myoview Information Sheet (see scanned document for further details).  Spoke with patient.      Nuclear Med Background Indications for Stress Test: Evaluation for Ischemia, Stent Patency   History: Echo, Heart Catheterization, Myocardial Infarction, Myocardial Perfusion Study, Stents  History Comments: 04 MI,Cath and stent([place]) 09 MPS EF=nl 03/28/10 Stress Echo EF=nl  Symptoms: Chest Pain with Exertion, Chest Tightness with Exertion, DOE, Fatigue, Near Syncope    Nuclear Pre-Procedure Cardiac Risk Factors: Family History - CAD, Hypertension, Lipids, NIDDM Height (in): 32

## 2010-05-16 NOTE — Assessment & Plan Note (Addendum)
Summary: Cardiology Nuclear Testing  Nuclear Med Background Indications for Stress Test: Evaluation for Ischemia, Stent Patency   History: Echo, Heart Catheterization, Myocardial Infarction, Myocardial Perfusion Study, Stents  History Comments: 04 MI,Cath and stent([place]) 09 MPS EF=nl 03/28/10 Stress Echo EF=nl  Symptoms: Chest Pain with Exertion, Chest Tightness with Exertion, DOE, Fatigue, Near Syncope    Nuclear Pre-Procedure Cardiac Risk Factors: Family History - CAD, Hypertension, Lipids, NIDDM Caffeine/Decaff Intake: None NPO After: 8:30 PM Lungs: clear IV 0.9% NS with Angio Cath: 22g     IV Site: L Antecubital IV Started by: Irven Baltimore, RN Chest Size (in) 34-36      Cup Size D     Height (in): 59 Weight (lb): 136 BMI: 27.57  Nuclear Med Study 1 or 2 day study:  1 day     Stress Test Type:  Treadmill/Lexiscan Reading MD:  Loralie Champagne, MD     Referring MD:  M.Cooper Resting Radionuclide:  Technetium 102m Tetrofosmin     Resting Radionuclide Dose:  11.0 mCi  Stress Radionuclide:  Technetium 43m Tetrofosmin     Stress Radionuclide Dose:  33.0 mCi   Stress Protocol  Max Systolic BP: 0000000 mm Hg Lexiscan: 0.4 mg   Stress Test Technologist:  Perrin Maltese, EMT-P     Nuclear Technologist:  Charlton Amor, CNMT  Rest Procedure  Myocardial perfusion imaging was performed at rest 45 minutes following the intravenous administration of Technetium 56m Tetrofosmin.  Stress Procedure  The patient received IV Lexiscan 0.4 mg over 15-seconds with concurrent low level exercise and then Technetium 10m Tetrofosmin was injected at 30-seconds while the patient continued walking one more minute.  There were no significant changes and rare pacs/pvcs with Lexiscan.  Quantitative spect images were obtained after a 45 minute delay.  QPS Raw Data Images:  Normal; no motion artifact; normal heart/lung ratio. Stress Images:  Small apical perfusion defect.  Rest Images:  Normal  homogeneous uptake in all areas of the myocardium. Subtraction (SDS):  Small reversible apical perfusion defect.  Transient Ischemic Dilatation:  1.15  (Normal <1.22)  Lung/Heart Ratio:  0.39  (Normal <0.45)  Quantitative Gated Spect Images QGS EDV:  64 ml QGS ESV:  26 ml QGS EF:  59 % QGS cine images:  Normal wall motion.    Overall Impression  Exercise Capacity: Lexiscan with low level exercise BP Response: Normal blood pressure response. Clinical Symptoms: Short of breath.  ECG Impression: No significant ST segment change suggestive of ischemia. Overall Impression: Small reversible apical perfusion defect that represents ischemia versus shifting breast artifact.  Low risk study.  Normal EF.   Appended Document: Cardiology Nuclear Testing reviewed result of both Myoview and stress echo. Poor exercise tolerance noted. Both studies are abnormal but appear to be low-risk. Would continue with medical therapy unless chest pattern accelerates - has been stable in past.   Appended Document: Cardiology Nuclear Testing Lauren - let's schedule a f/u visit in 3 months to make sure CP pattern is stable. thx (Per Dr Burt Knack)  I have left messages for this pt to call back about results without a return call.  I attempted to reach her by phone today before mailing letter and she answered the phone. Pt aware of results by phone.  The pt said her CP symptoms have decreased since appt. Appt scheduled with Dr Burt Knack on 07/31/10.

## 2010-05-16 NOTE — Assessment & Plan Note (Addendum)
Summary: r/s myoview wt 137 dx 414.00/medicare/medicaid/sl  Nuclear Med Background Indications for Stress Test: Evaluation for Ischemia, Stent Patency   History: Echo, Heart Catheterization, Myocardial Infarction, Myocardial Perfusion Study, Stents  History Comments: 04 MI,Cath and stent([place]) 09 MPS EF=nl 03/28/10 Stress Echo EF=nl  Symptoms: Chest Pain with Exertion, Chest Tightness with Exertion, DOE, Fatigue, Near Syncope    Nuclear Pre-Procedure Cardiac Risk Factors: Family History - CAD, Hypertension, Lipids, NIDDM Height (in): 84  Nuclear Med Study  QPS Raw Data Images:  Normal; no motion artifact; normal heart/lung ratio. Stress Images:  Small apical perfusion defect.  Rest Images:  Normal homogeneous uptake in all areas of the myocardium. Subtraction (SDS):  Small reversible apical perfusion defect.    Quantitative Gated Spect Images QGS EF:  59% % QGS cine images:  Normal wall motion.    Overall Impression  Exercise Capacity: Lexiscan with low level exercise BP Response: Normal blood pressure response. Clinical Symptoms: Short of breath, headache.  ECG Impression: No significant ST segment change suggestive of ischemia.  Baseline artifact.  Overall Impression: Small reversible apical perfusion defect.  This could represent ischemia (versus shifting breast attenuation).  Low risk study.

## 2010-05-17 ENCOUNTER — Encounter (INDEPENDENT_AMBULATORY_CARE_PROVIDER_SITE_OTHER): Payer: Self-pay | Admitting: Nurse Practitioner

## 2010-05-22 NOTE — Letter (Signed)
Summary: [facility] MED[facility]PPLIES  MED-CARE DIABETIC & MEDICAL SUPPLIES   Imported By: Roland Earl 05/17/2010 08:23:36  _____________________________________________________________________  External Attachment:    Type:   Image     Comment:   External Document

## 2010-05-23 ENCOUNTER — Encounter (INDEPENDENT_AMBULATORY_CARE_PROVIDER_SITE_OTHER): Payer: Self-pay | Admitting: Nurse Practitioner

## 2010-05-23 ENCOUNTER — Encounter: Payer: Self-pay | Admitting: Nurse Practitioner

## 2010-05-23 ENCOUNTER — Other Ambulatory Visit (HOSPITAL_COMMUNITY): Payer: Self-pay | Admitting: Internal Medicine

## 2010-05-23 DIAGNOSIS — L0291 Cutaneous abscess, unspecified: Secondary | ICD-10-CM | POA: Insufficient documentation

## 2010-05-23 DIAGNOSIS — L0390 Cellulitis, unspecified: Secondary | ICD-10-CM

## 2010-05-23 LAB — CONVERTED CEMR LAB
Albumin: 4 g/dL (ref 3.5–5.2)
Alkaline Phosphatase: 51 units/L (ref 39–117)
BUN: 20 mg/dL (ref 6–23)
Blood Glucose, Fingerstick: 98
Creatinine, Ser: 1.22 mg/dL — ABNORMAL HIGH (ref 0.40–1.20)
Eosinophils Absolute: 0.3 10*3/uL (ref 0.0–0.7)
Eosinophils Relative: 6 % — ABNORMAL HIGH (ref 0–5)
Glucose, Bld: 70 mg/dL (ref 70–99)
HCT: 36.6 % (ref 36.0–46.0)
Lymphs Abs: 1.8 10*3/uL (ref 0.7–4.0)
MCHC: 31.7 g/dL (ref 30.0–36.0)
MCV: 92 fL (ref 78.0–100.0)
Monocytes Absolute: 0.4 10*3/uL (ref 0.1–1.0)
Monocytes Relative: 8 % (ref 3–12)
RBC: 3.98 M/uL (ref 3.87–5.11)
Total Bilirubin: 0.4 mg/dL (ref 0.3–1.2)
WBC: 4.4 10*3/uL (ref 4.0–10.5)

## 2010-05-24 ENCOUNTER — Encounter (INDEPENDENT_AMBULATORY_CARE_PROVIDER_SITE_OTHER): Payer: Self-pay | Admitting: Nurse Practitioner

## 2010-05-28 NOTE — Assessment & Plan Note (Signed)
Summary: Diabetes/HTN   Vital Signs:  Patient profile:   70 year old female Menstrual status:  postmenopausal Weight:      139.1 pounds BMI:     28.20 Temp:     97.9 degrees F oral Pulse rate:   80 / minute Pulse rhythm:   regular Resp:     20 per minute BP sitting:   122 / 90  (left arm) Cuff size:   large  Vitals Entered By: Isla Pence (May 23, 2010 1:03 PM)  Nutrition Counseling: Patient's BMI is greater than 25 and therefore counseled on weight management options. CC: follow-up visit went to urgent care...chest discomfort possible acid reflux she has been taking medication without food...lump on thigh by vagina came up on leg about 2 days ago, Hypertension Management, Lipid Management Is Patient Diabetic? Yes Pain Assessment Patient in pain? yes     Location: chest CBG Result 98  Does patient need assistance? Functional Status Self care Ambulation Normal   Primary Care Provider:  Aurora Mask FNP  CC:  follow-up visit went to urgent care...chest discomfort possible acid reflux she has been taking medication without food...lump on thigh by vagina came up on leg about 2 days ago, Hypertension Management, and Lipid Management.  History of Present Illness:  Pt into the office for f/u on diabetes.   Hypertension History:      She denies headache, chest pain, and palpitations.  She notes no problems with any antihypertensive medication side effects.        Positive major cardiovascular risk factors include female age 80 years old or older, diabetes, hyperlipidemia, and hypertension.  Negative major cardiovascular risk factors include non-tobacco-user status.        Positive history for target organ damage include ASHD (either angina/prior MI/prior CABG).  Further assessment for target organ damage reveals no history of cardiac end-organ damage (CHF/LVH), stroke/TIA, peripheral vascular disease, renal insufficiency, or hypertensive retinopathy.    Lipid  Management History:      Positive NCEP/ATP III risk factors include female age 63 years old or older, diabetes, hypertension, and ASHD (either angina/prior MI/prior CABG).  Negative NCEP/ATP III risk factors include no history of early menopause without estrogen hormone replacement, HDL cholesterol greater than 60, non-tobacco-user status, no prior stroke/TIA, no peripheral vascular disease, and no history of aortic aneurysm.        The patient states that she knows about the "Therapeutic Lifestyle Change" diet.  Her compliance with the TLC diet is fair.  Comments: unable to check cholesterol today due to pt not being fasting.    Habits & Providers  Alcohol-Tobacco-Diet     Alcohol drinks/day: <1     Alcohol type: wine     >5/day in last 3 mos: holidays     Tobacco Status: never  Exercise-Depression-Behavior     Does Patient Exercise: no     Have you felt down or hopeless? no     Have you felt little pleasure in things? no     Drug Use: never     Seat Belt Use: always     Sun Exposure: frequent  Allergies (verified): 1)  ! Pcn 2)  * Milk  Review of Systems General:  Denies fever. CV:  Denies chest pain or discomfort. Resp:  Denies cough. GI:  Denies abdominal pain, nausea, and vomiting. Derm:  Complains of lesion(s).  Physical Exam  General:  alert.  alert.   Head:  normocephalic.  normocephalic.   Ears:  ear piercing(s)  noted.  ear piercing(s) noted.   Lungs:  normal breath sounds.  normal breath sounds.   Heart:  normal rate and regular rhythm.  normal rate and regular rhythm.   Abdomen:  normal bowel sounds.  normal bowel sounds.   Msk:  right inguinal - erythematous, tender, boil Neurologic:  alert & oriented X3.  alert & oriented X3.   Skin:  color normal.  color normal.   Psych:  Oriented X3.  Oriented X3.     Impression & Recommendations:  Problem # 1:  DIABETES MELLITUS, TYPE II (ICD-250.00) Hgba1c = 6.1 continue current meds Her updated medication list for  this problem includes:    Metformin Hcl 500 Mg Tabs (Metformin hcl) .Marland Kitchen... Take 1 tablet by mouth once a day    Lisinopril-hydrochlorothiazide 20-12.5 Mg Tabs (Lisinopril-hydrochlorothiazide) .Marland Kitchen... Take 1 tablet by mouth every morning    Ecotrin Low Strength 81 Mg Tbec (Aspirin) .Marland Kitchen... 1 tablet by mouth daily  Orders: T-Urine Microalbumin w/creat. ratio [id number]) UA Dipstick w/o Micro (manual) (81002) Hgb A1C [id number]) Capillary Blood Glucose/CBG [id number])  Problem # 2:  HYPERTENSION (ICD-401.9) BP is doing well  Her updated medication list for this problem includes:    Lisinopril-hydrochlorothiazide 20-12.5 Mg Tabs (Lisinopril-hydrochlorothiazide) .Marland Kitchen... Take 1 tablet by mouth every morning  Orders: T-Comprehensive Metabolic Panel ([id number])  Problem # 3:  HYPERLIPIDEMIA (ICD-272.4) Unable to check labs b/c pt is not fasting Her updated medication list for this problem includes:    Crestor 10 Mg Tabs (Rosuvastatin calcium) .Marland Kitchen... Take 1 tablet by mouth nightly for cholesterol  Problem # 4:  ABSCESS (ICD-682.9) advise pt to apply warm compresses to affected area bactrobain ointment samples given  Problem # 5:  POSTMENOPAUSAL STATUS (ICD-V49.81) will re-order, pt did not get done previously Orders: Dexa scan (Dexa scan)  Complete Medication List: 1)  Crestor 10 Mg Tabs (Rosuvastatin calcium) .... Take 1 tablet by mouth nightly for cholesterol 2)  Nitroglycerin 0.4 Mg Subl (Nitroglycerin) .... Take one tablet at onset of chest pain. may repeat every 5 minutes up to 3 dose. if unreleaved cal 911 3)  Metformin Hcl 500 Mg Tabs (Metformin hcl) .... Take 1 tablet by mouth once a day 4)  Lisinopril-hydrochlorothiazide 20-12.5 Mg Tabs (Lisinopril-hydrochlorothiazide) .... Take 1 tablet by mouth every morning 5)  Multivitamins Tabs (Multiple vitamin) .Marland Kitchen.. 1 tablet by mouth daily 6)  Ecotrin Low Strength 81 Mg Tbec (Aspirin) .Marland Kitchen.. 1 tablet by mouth daily 7)  Miralax Powd  (Polyethylene glycol 3350) .Marland Kitchen.. 17gm in 4-8oz of water or juice daily for stools 8)  Triamcinolone Acetonide 0.1 % Crea (Triamcinolone acetonide) .Marland Kitchen.. 1 application topically daily to affected area 9)  Oxybutynin Chloride 5 Mg Tabs (Oxybutynin chloride) .... One tablet by mouth two times a day for bladder 10)  Tylenol Extra Strength 500 Mg Tabs (Acetaminophen) .... One tablet by mouth two times a day  Other Orders: T-CBC w/Diff [id number]) T-TSH [phone])  Hypertension Assessment/Plan:      The patient's hypertensive risk group is category C: Target organ damage and/or diabetes.  Today's blood pressure is 122/90.  Her blood pressure goal is < 130/80.  Lipid Assessment/Plan:      Based on NCEP/ATP III, the patient's risk factor category is "history of coronary disease, peripheral vascular disease, cerebrovascular disease, or aortic aneurysm along with either diabetes, current smoker, or LDL > 130 plus HDL < 40 plus triglycerides > 200".  The patient's lipid goals are as follows: Total cholesterol goal is 200; LDL cholesterol goal  is 42; HDL cholesterol goal is 40; Triglyceride goal is 150.    Patient Instructions: 1)  Labs have been done today except cholesterol. You need to schedule a fasting lab vist - lipids (272.4) 2)  Follow up in 4 months for high blood pressure and diabetes 3)  Keep the appointment for bone density Prescriptions: TRIAMCINOLONE ACETONIDE 0.1 % CREA (TRIAMCINOLONE ACETONIDE) 1 application topically daily to affected area  #16ounces x 0   Entered and Authorized by:   Aurora Mask FNP   Signed by:   Aurora Mask FNP on 05/23/2010   Method used:   Print then Give to Patient   RxID:   [id number]    Orders Added: 1)  Est. Patient Level III [id number] 2)  T-Comprehensive Metabolic Panel [id number] 3)  T-CBC w/Diff [id number] 4)  T-TSH [id number] 5)  T-Urine Microalbumin w/creat. ratio [[id number]] 6)  UA Dipstick w/o Micro (manual)  [81002] 7)  Hgb A1C [[id number]] 8)  Capillary Blood Glucose/CBG [82948] 9)  Dexa scan [Dexa scan]    Laboratory Results   Blood Tests   Date/Time Received: May 23, 2010 1:40 PM   HGBA1C: 6.1%   (Normal Range: Non-Diabetic - 3-6%   Control Diabetic - 6-8%) CBG Random:: 98mg /dL

## 2010-05-28 NOTE — Letter (Signed)
Summary: *HSN Results Follow up  [facility]  [address] [place], [place] [address]   Phone: [phone]  Fax: [phone]      05/24/2010   [facility] [address] [place], [place]  [address]   Dear  Ms. [name],                            ____S.Drinkard,FNP   ____D. Gore,FNP       ____B. McPherson,MD   ____V. Rankins,MD    ____E. Mulberry,MD    __X__N. Hassell Done, FNP  ____D. Jobe Igo, MD    ____K. Tomma Lightning, MD    ____Other     This letter is to inform you that your recent test(s):  _______Pap Smear    ___X____Lab Test     _______X-ray    ___X____ is within acceptable limits  _______ requires a medication change  _______ requires a follow-up lab visit  _______ requires a follow-up visit with your provider   Comments:  Labs done during recent office visit are normal.       _________________________________________________________ If you have any questions, please contact our office [phone].                    Sincerely,    Evelyn Mask FNP [facility]

## 2010-05-29 ENCOUNTER — Other Ambulatory Visit (HOSPITAL_COMMUNITY): Payer: Medicare Other

## 2010-05-29 ENCOUNTER — Encounter (INDEPENDENT_AMBULATORY_CARE_PROVIDER_SITE_OTHER): Payer: Self-pay | Admitting: Nurse Practitioner

## 2010-05-30 ENCOUNTER — Encounter (INDEPENDENT_AMBULATORY_CARE_PROVIDER_SITE_OTHER): Payer: Self-pay | Admitting: Nurse Practitioner

## 2010-05-30 LAB — CONVERTED CEMR LAB
Cholesterol: 165 mg/dL (ref 0–200)
HDL: 76 mg/dL (ref >39)
Total CHOL/HDL Ratio: 2.2
Triglycerides: 46 mg/dL (ref <150)
VLDL: 9 mg/dL (ref 0–40)

## 2010-06-04 ENCOUNTER — Ambulatory Visit (HOSPITAL_COMMUNITY): Admission: RE | Admit: 2010-06-04 | Payer: Medicare Other | Source: Ambulatory Visit

## 2010-06-06 NOTE — Letter (Signed)
Summary: Lipid Letter  [facility]  [address] [place], [place] [address]   Phone: [phone]  Fax: [phone]    05/30/2010  [facility] [address] [place], [place]  [address]  Dear Ms. Robet Leu:  We have carefully reviewed your last lipid profile from 05/29/2010 and the results are noted below with a summary of recommendations for lipid management.    Cholesterol:       165     Goal: less than 200   HDL "good" Cholesterol:   76     Goal: greater than 40   LDL "bad" Cholesterol:   80     Goal: less than 70   Triglycerides:       46     Goal: less than 150    Cholesterol labs are doing ok. Keep taking your cholesterol medications as ordered.      Current Medications: 1)    Crestor 10 Mg  Tabs (Rosuvastatin calcium) .... Take 1 tablet by mouth nightly for cholesterol 2)    Nitroglycerin 0.4 Mg Subl (Nitroglycerin) .... Take one tablet at onset of chest pain. may repeat every 5 minutes up to 3 dose. if unreleaved cal 911 3)    Metformin Hcl 500 Mg  Tabs (Metformin hcl) .... Take 1 tablet by mouth once a day 4)    Lisinopril-hydrochlorothiazide 20-12.5 Mg  Tabs (Lisinopril-hydrochlorothiazide) .... Take 1 tablet by mouth every morning 5)    Multivitamins   Tabs (Multiple vitamin) .Marland Kitchen.. 1 tablet by mouth daily 6)    Ecotrin Low Strength 81 Mg  Tbec (Aspirin) .Marland Kitchen.. 1 tablet by mouth daily 7)    Miralax   Powd (Polyethylene glycol 3350) .Marland Kitchen.. 17gm in 4-8oz of water or juice daily for stools 8)    Triamcinolone Acetonide 0.1 % Crea (Triamcinolone acetonide) .Marland Kitchen.. 1 application topically daily to affected area 9)    Oxybutynin Chloride 5 Mg Tabs (Oxybutynin chloride) .... One tablet by mouth two times a day for bladder 10)    Tylenol Extra Strength 500 Mg Tabs (Acetaminophen) .... One tablet by mouth two times a day  If you have any questions, please call. We appreciate being able to work with you.   Sincerely,    [facility] Aurora Mask FNP

## 2010-06-13 ENCOUNTER — Encounter (INDEPENDENT_AMBULATORY_CARE_PROVIDER_SITE_OTHER): Payer: Self-pay | Admitting: Nurse Practitioner

## 2010-06-14 ENCOUNTER — Ambulatory Visit (HOSPITAL_COMMUNITY)
Admission: RE | Admit: 2010-06-14 | Discharge: 2010-06-14 | Disposition: A | Discharge status: Home / Self Care | Payer: Medicare Other | Source: Ambulatory Visit | Attending: Internal Medicine | Admitting: Internal Medicine

## 2010-06-14 DIAGNOSIS — Z780 Asymptomatic menopausal state: Secondary | ICD-10-CM | POA: Insufficient documentation

## 2010-06-14 DIAGNOSIS — Z13820 Encounter for screening for osteoporosis: Secondary | ICD-10-CM | POA: Insufficient documentation

## 2010-06-18 NOTE — Medication Information (Signed)
Summary: [facility]  [facility]   Imported By: Roland Earl 06/13/2010 15:10:32  _____________________________________________________________________  External Attachment:    Type:   Image     Comment:   External Document

## 2010-06-27 ENCOUNTER — Emergency Department (HOSPITAL_COMMUNITY)
Admission: EM | Admit: 2010-06-27 | Discharge: 2010-06-27 | Disposition: A | Discharge status: Home / Self Care | Payer: Medicare Other | Attending: Emergency Medicine | Admitting: Emergency Medicine

## 2010-06-27 ENCOUNTER — Emergency Department (HOSPITAL_COMMUNITY): Payer: Medicare Other

## 2010-06-27 ENCOUNTER — Emergency Department (HOSPITAL_COMMUNITY)
Admission: EM | Admit: 2010-06-27 | Discharge: 2010-06-27 | Discharge status: Against Medical Advice | Payer: Medicare Other | Attending: Emergency Medicine | Admitting: Emergency Medicine

## 2010-06-27 DIAGNOSIS — I10 Essential (primary) hypertension: Secondary | ICD-10-CM | POA: Insufficient documentation

## 2010-06-27 DIAGNOSIS — M129 Arthropathy, unspecified: Secondary | ICD-10-CM | POA: Insufficient documentation

## 2010-06-27 DIAGNOSIS — K5900 Constipation, unspecified: Secondary | ICD-10-CM | POA: Insufficient documentation

## 2010-06-27 DIAGNOSIS — M545 Low back pain, unspecified: Secondary | ICD-10-CM | POA: Insufficient documentation

## 2010-06-27 DIAGNOSIS — R109 Unspecified abdominal pain: Secondary | ICD-10-CM | POA: Insufficient documentation

## 2010-06-27 DIAGNOSIS — I252 Old myocardial infarction: Secondary | ICD-10-CM | POA: Insufficient documentation

## 2010-06-27 DIAGNOSIS — E7800 Pure hypercholesterolemia, unspecified: Secondary | ICD-10-CM | POA: Insufficient documentation

## 2010-06-27 DIAGNOSIS — Z79899 Other long term (current) drug therapy: Secondary | ICD-10-CM | POA: Insufficient documentation

## 2010-06-27 DIAGNOSIS — E119 Type 2 diabetes mellitus without complications: Secondary | ICD-10-CM | POA: Insufficient documentation

## 2010-06-27 DIAGNOSIS — Z7982 Long term (current) use of aspirin: Secondary | ICD-10-CM | POA: Insufficient documentation

## 2010-06-27 LAB — BASIC METABOLIC PANEL
Calcium: 9.5 mg/dL (ref 8.4–10.5)
Creatinine, Ser: 1.05 mg/dL (ref 0.4–1.2)
GFR calc Af Amer: >60 mL/min (ref >60)
GFR calc non Af Amer: 52 mL/min — ABNORMAL LOW (ref >60)
Sodium: 139 mEq/L (ref 135–145)

## 2010-06-27 LAB — DIFFERENTIAL
Basophils Absolute: 0 K/uL (ref 0.0–0.1)
Basophils Relative: 1 % (ref 0–1)
Eosinophils Absolute: 0.2 K/uL (ref 0.0–0.7)
Eosinophils Relative: 5 % (ref 0–5)
Lymphocytes Relative: 44 % (ref 12–46)
Lymphs Abs: 2.1 10*3/uL (ref 0.7–4.0)
Monocytes Absolute: 0.5 K/uL (ref 0.1–1.0)
Monocytes Relative: 10 % (ref 3–12)
Neutro Abs: 1.9 K/uL (ref 1.7–7.7)
Neutrophils Relative %: 41 % — ABNORMAL LOW (ref 43–77)

## 2010-06-27 LAB — URINALYSIS, ROUTINE W REFLEX MICROSCOPIC
Bilirubin Urine: NEGATIVE
Glucose, UA: NEGATIVE mg/dL
Hgb urine dipstick: NEGATIVE
Ketones, ur: NEGATIVE mg/dL
Nitrite: NEGATIVE
Protein, ur: NEGATIVE mg/dL
Specific Gravity, Urine: 1.016 (ref 1.005–1.030)
Urobilinogen, UA: 0.2 mg/dL (ref 0.0–1.0)
pH: 6 (ref 5.0–8.0)

## 2010-06-27 LAB — CBC
HCT: 37.3 % (ref 36.0–46.0)
Hemoglobin: 12.4 g/dL (ref 12.0–15.0)
MCH: 29.4 pg (ref 26.0–34.0)
MCHC: 33.2 g/dL (ref 30.0–36.0)
MCV: 88.4 fL (ref 78.0–100.0)
Platelets: 133 10*3/uL — ABNORMAL LOW (ref 150–400)
RBC: 4.22 MIL/uL (ref 3.87–5.11)
RDW: 13.7 % (ref 11.5–15.5)
WBC: 4.7 K/uL (ref 4.0–10.5)

## 2010-06-27 LAB — BASIC METABOLIC PANEL WITH GFR
BUN: 15 mg/dL (ref 6–23)
CO2: 25 meq/L (ref 19–32)
Chloride: 106 meq/L (ref 96–112)
Glucose, Bld: 102 mg/dL — ABNORMAL HIGH (ref 70–99)
Potassium: 5.4 meq/L — ABNORMAL HIGH (ref 3.5–5.1)

## 2010-08-13 NOTE — Discharge Summary (Signed)
[name], Evelyn Morrison            ACCOUNT NO.:  [id number]   MEDICAL RECORD NO.:  [id number]           PATIENT TYPE:   LOCATION:                                 FACILITY:   PHYSICIAN:  Vernell Leep, MD     DATE OF BIRTH:  12-Feb-1941   DATE OF ADMISSION:  02/07/2008  DATE OF DISCHARGE:  02/08/2008                               DISCHARGE SUMMARY   PRIMARY MEDICAL DOCTOR:  Dr. Hassell Done at [facility].   DISCHARGE DIAGNOSES:  1. Chest pain - resolved.  Myocardial infarction ruled out.  Workup      negative.  2. Acute bronchitis.  3. Type 2 diabetes mellitus.  4. Dyslipidemia.  5. Hypertension.  6. Anemia, for outpatient workup and follow-up as deemed necessary.  7. ? Chronic kidney disease.  Follow up repeat basic metabolic panel      in the next 5 - 7 days.   DISCHARGE MEDICATIONS:  Unchanged from prior to admission.  1. Metformin ER 500 mg p.o. daily.  2. Crestor 10 mg p.o. nightly.  3. Lisinopril 20 mg p.o. daily.  4. Hydrochlorothiazide 12.5 mg p.o. daily.  5. Aspirin 81 mg p.o. daily.  6. Nitroglycerin 0.4 mg sublingual p.r.n.  7. Avelox 400 mg p.o. daily to complete a 5-day course.   PROCEDURES:  1. Myoview on February 08, 2008.  Impression:      a.     No reversible ischemia or infarction.      b.     Normal wall motion.      c.     Left ventricular ejection fraction equal 67% - 64%.  2. Chest x-ray on February 06, 2008.  Impression:  No acute disease.   PERTINENT LABS:  Basic metabolic panel today with BUN 22, creatinine  1.22.  CBCs with hemoglobin 10.8, hematocrit 32.3, white blood cell 5.5,  platelets 155.  Troponins were negative.  Patient had mildly elevated  CKs, the maximum was 246.  BNP less than 30, D-dimer 0.34, TSH 1.072,  hemoglobin A1c 6.2.  Lipid panel:  Cholesterol 160, triglycerides 47,  HDL 81, LDL 70, VLDL 9.   CONSULTATIONS:  Cardiology, Dr. Sherren Mocha.   HOSPITAL COURSE AND PATIENT DISPOSITION:  Evelyn Morrison is a very  pleasant  70 year old African American female patient with history of  coronary artery disease status post two MIs, status post stent, type 2  diabetes, hypertension, ex-smoker, hyperlipidemia who presented with  atypical chest pain.  The first episode of pain seemed like when she had  her previous MI but subsequent chest pains were more atypical.  It was  nonpleuritic, nonradiating.  The patient also had upper respiratory  symptoms with cough and greenish sputum with specks of blood.  She also  complained of some dyspnea on exertion.  She was thereby admitted to  rule out acute coronary syndrome.   PROBLEMS:  1. Chest pain.  The patient was admitted to telemetry with no      arrhythmia alarms.  Her cardiac enzymes were cycled and not      suggestive of acute coronary syndrome.  Her D-dimer was  negative.      Cardiology was consulted.  They proceeded to do a Myoview which was      negative.  Cardiology has cleared her for discharge.  The patient      is to continue her aspirin.  2. Type 2 diabetes mellitus.  The patient's metformin was held and      patient was placed on sliding scale insulin in the hospital.      Patient can continue her metformin but is advised to consume a      liberal amount of liquids and is to follow-up with her primary MD      in the next 5 - 7 days with a repeat basic metabolic panel given      her slightly elevated creatinine.  If her creatinine rises consider      stopping hydrochlorothiazide and repeating her basic metabolic      panel and discontinuing metformin.  3. Acute bronchitis.  Clinically better, complete course of antibiotic      therapy.  4. Dyslipidemia with good control, continue with Crestor.  5. Hypertension with adequate control on lisinopril and      hydrochlorothiazide.  Management as above.  6. Anemia, will need outpatient workup and follow-up as deemed      necessary by her primary MD  7. Question chronic kidney disease as indicated earlier.  the  patient      is to follow-up with her primary MD with repeat basic metabolic      panel in the next 5 - 7 days.      Vernell Leep, MD  Electronically Signed     AH/MEDQ  D:  02/08/2008  T:  02/08/2008  Job:  [id number]   cc:   Reinaldo Raddle. Burt Knack, MD

## 2010-08-13 NOTE — Assessment & Plan Note (Signed)
Evelyn Morrison                            CARDIOLOGY OFFICE NOTE   NAME:Evelyn Morrison, Evelyn Morrison                     MRN:          [id number]  DATE:03/16/2008                            DOB:          Sep 03, 1940    REASON FOR VISIT:  CAD and hospital followup.   HISTORY OF PRESENT ILLNESS:  Evelyn Morrison is a very nice 70 year old  woman who was recently hospitalized on February 07, 2008 for chest pain.  She has a history of CAD and prior stenting several years ago.  This was  done outside of our hospital system.  She has done well in the interim  until her recent episode of chest pain.  She has chronic exertional  dyspnea with moderate level activity and an occasional chest pain with  moderate level activity.  With normal activity she has no symptoms.  She  underwent an adenosine Myoview stress test that showed an LVEF of 64%.  There was no reversible ischemia or signs of infarction.  Her wall  motion was normal.  She has been managed medically and has had no  further problems since hospital discharge.   The patient reports chest discomfort and shortness of breath with  vacuuming her floor.  Otherwise, she has had no symptoms.  Her symptoms  have been stable for several years.  She denies orthopnea, PND, edema,  palpitations, or lightheadedness.   MEDICATIONS:  1. Metformin 500 mg daily.  2. Crestor 10 mg at bedtime.  3. Lisinopril 20 mg daily.  4. Hydrochlorothiazide 12.5 mg daily.  5. Aspirin 81 mg daily.   ALLERGIES:  PENICILLIN.   PHYSICAL EXAMINATION:  GENERAL:  The patient is alert and oriented.  No  acute distress.  VITAL SIGNS:  Weight is 144 pounds, blood pressure 128/86, heart rate  78, and respiratory rate 12.  HEENT:  Normal.  NECK:  Normal carotid upstrokes.  No bruits.  JVP normal.  LUNGS:  Clear bilaterally.  HEART:  Regular rate and rhythm.  No murmurs or gallops.  ABDOMEN:  Soft, nontender, and no organomegaly.  EXTREMITIES:  No  clubbing, cyanosis, or edema.  Peripheral pulses intact  and equal.  SKIN:  Warm and dry without rash.   EKG shows normal sinus rhythm with LVH.  There are no significant ST or  T-wave changes.   ASSESSMENT:  1. Coronary artery disease.  The patient does have symptoms consistent      with angina with moderate activity.  They are stable over time.      Her Myoview scan was negative for ischemia.  Continue medical      therapy with some aggressive secondary risk reduction.  2. Dyslipidemia.  She has been on rosuvastatin for many years.  Her      lipids are excellent with an LDL of 70 and HDL of 81.  3. Hypertension.  Blood pressure under good control.  4. Diabetes.  Hemoglobin A1c was 6.2, followed by her primary care      physician.   For followup, I would like to see Evelyn Morrison back on a yearly basis.  If she  has problems in the interim, I would be happy to see her sooner.     Juanda Bond. Burt Knack, MD  Electronically Signed    MDC/MedQ  DD: 03/16/2008  DT: 03/17/2008  Job #: [id number]   cc:   Aurora Mask, FNP

## 2010-08-13 NOTE — H&P (Signed)
Evelyn Morrison, Evelyn Morrison            ACCOUNT NO.:  [id number]   MEDICAL RECORD NO.:  [id number]          PATIENT TYPE:  INP   LOCATION:  6527                         FACILITY:  [facility]   PHYSICIAN:  Jana Hakim, M.D. DATE OF BIRTH:  [birth year]   DATE OF ADMISSION:  02/07/2008  DATE OF DISCHARGE:                              HISTORY & PHYSICAL   PRIMARY CARE PHYSICIAN:  [facility], Dr. Hassell Done.   CHIEF COMPLAINT:  Chest pain.   HISTORY OF PRESENT ILLNESS:  This is a 70 year old female who presents  to the emergency department with complaints of chest pain which started  about 10:00 a.m. on February 06, 2008.  She describes the pain as being  located in the midchest area.  Rated the pain as being a 7/10 at the  worst.  She reports that the pain lasted approximately 30-40 minutes  after the first episode, and it returned later in the evening.  The  patient reports when the pain restarted it felt similar to the pain she  had when she had two previous heart attacks.  She has a history of  coronary artery disease with a stent times one and had a her first MI in  2004 with stent placement times one and a second MI which she reports  was smaller in 2005.  She reports living in [place] at the time and  seeing a cardiologist there.  The patient reports having symptoms of  shortness of breath and nausea and vomiting times one.  She denies  diaphoresis.  She reports when the second episode began she took one  sublingual nitroglycerin and a baby aspirin and this resolved the pain.  The patient states that when the initial chest pain began, she felt this  may have been secondary to the upper respiratory infection that she has  been suffering from for the past few days.  She does report having  productive cough and chest congestion.  She denies having any fevers,  chills or myalgias.   PAST MEDICAL HISTORY:  1. Coronary artery disease as mentioned above.  2. Type 2 diabetes mellitus.  3.  Hypertension.  4. Hyperlipidemia.  5. Arthritis.   PAST SURGICAL HISTORY:  1. History of total abdominal hysterectomy.  2. C-section times one.  3. Cholecystectomy.  4. Ectopic pregnancy with surgery.  5. Bilateral laser eye surgeries for cataracts.   MEDICATIONS:  1. Metformin ER 500 mg one p.o. daily.  2. Nitroglycerin 0.4 mg sublingual one tablet q. 5 minutes x3 p.r.n.      severe chest pain.  3. Aspirin 81 mg one p.o. daily.  4. Crestor 10 mg one p.o. q.h.s.  5. Lisinopril/hydrochlorothiazide 20/12.5 mg one p.o. q.a.m.   ALLERGIES:  PENICILLIN.   SOCIAL HISTORY:  The patient is married.  She is a nonsmoker,  nondrinker.   FAMILY HISTORY:  Noncontributory.   REVIEW OF SYSTEMS:  Pertinents as mentioned above.   PHYSICAL EXAMINATION FINDINGS:  GENERAL:  This is a 70 year old, well-  nourished, well-developed female in no discomfort or acute distress  currently.  VITAL SIGNS:  Temperature 98.1, blood pressure 152/104, heart rate 98,  respirations 28, O2 saturations 98-100%.  HEENT:  Normocephalic, atraumatic.  Pupils equally round reactive to  light.  Extraocular movements are intact.  Funduscopic benign.  Oropharynx is clear.  NECK:  Supple, full range of motion.  No thyromegaly, adenopathy,  jugular venous distention.  CARDIOVASCULAR:  Regular rate and rhythm.  No murmurs, gallops or rubs.  LUNGS:  Clear to auscultation bilaterally.  ABDOMEN:  Positive bowel sounds, soft, nontender, nondistended.  EXTREMITIES:  Without cyanosis, clubbing or edema.  NEUROLOGIC:  Examination nonfocal.   LABORATORY STUDIES:  White blood cell count 5.6, hemoglobin 11.8,  hematocrit 35.3 and platelets 174.  Sodium 140, potassium 3.7, chloride  105, bicarb 26, BUN 13, creatinine 1.13 and glucose 103.  Point of care  cardiac markers with a myoglobin of 135, CK-MB 3.4, troponin less than  0.05.  Chest x-ray reveals no acute disease process.  An EKG reveals a  normal sinus rhythm at a rate  of 90 without acute ST-segment changes.   ASSESSMENT:  A 70 year old female being admitted with:  1. Chest pain.  2. Coronary artery disease history.  3. Hypertension.  4. Hyperlipidemia  5. Type 2 diabetes mellitus.   PLAN:  The patient will be admitted to telemetry area and cardiac  enzymes will be performed.  The patient will be placed on NitroPaste,  oxygen and aspirin therapy and will continue on her regular medications  except for the metformin therapy at this time.  Sliding scale insulin  coverage has also been ordered with CBG checks q. 4 hours then with  meals as needed.  The patient will also be placed on DVT and GI  prophylaxis at this time and a fasting lipid panel will be checked in  the a.m.      Jana Hakim, M.D.  Electronically Signed     HJ/MEDQ  D:  02/07/2008  T:  02/07/2008  Job:  [id number]

## 2010-08-13 NOTE — Consult Note (Signed)
Evelyn Morrison, Evelyn Morrison            ACCOUNT NO.:  [id number]   MEDICAL RECORD NO.:  [id number]          PATIENT TYPE:  INP   LOCATION:  6527                         FACILITY:  [facility]   PHYSICIAN:  Juanda Bond. Burt Knack, MD  DATE OF BIRTH:  04/06/1940   DATE OF CONSULTATION:  02/07/2008  DATE OF DISCHARGE:                                 CONSULTATION   REASON FOR CONSULTATION:  Chest pain.   HISTORY OF PRESENT ILLNESS:  Ms. Soles is a 70 year old African-  American who developed midsternal chest discomfort on February 06, 2008.  She has a history of coronary artery disease, and she describes symptoms  similar to her previous myocardial infarction in 2004.  She has a  difficult time characterizing her pain but states that it is a turning  pain in the mid chest area.  The pain does not radiate.  There was  partial relief with sublingual nitroglycerin.  Of note, she has had  bronchitis over the past 3-4 days and has experienced a lot of coughing  with that.  Her upper respiratory infections have improved over the last  24 hours.  She denies fevers or chills.  She describes productive cough  with green sputum.  Her chest pain was intermittent throughout the  course of the day yesterday, but she has had no chest pain over night or  today.   She complains of chronic exertional dyspnea with low to moderate level  activity such as playing with her grandchildren.  This has been somewhat  worse over the past few months.  She denies exertional chest pain.   PAST MEDICAL HISTORY:  1. Coronary artery disease with percutaneous coronary intervention in      2004.  She describes a myocardial infarction at presentation and      also had a small heart attack in 2005.  2. Type 2 diabetes.  3. Essential hypertension.  4. Hyperlipidemia.  5. Osteoarthritis.  6. Benign breast cysts.   HOME MEDICATIONS:  1. Metformin ER 500 mg.  2. Crestor 10 mg daily.  3. Lisinopril 20 mg daily.  4.  Hydrochlorothiazide 12.5 mg daily.  5. Aspirin 81 mg daily.  6. Sublingual nitroglycerin p.r.n.   ALLERGIES:  PENICILLIN.   SOCIAL HISTORY:  The patient is married.  She does not smoke cigarettes  or drink alcohol.   FAMILY HISTORY:  Mother died at age 3 of a myocardial infarction.  Father died at age 45 of a myocardial infarction.   REVIEW OF SYSTEMS:  A complete 12-point review of systems was performed.  All pertinent positives were described above.  Other notable positive  findings were generalized fatigue, headache, rhinorrhea, orthopnea, PND,  polyuria.  All other systems were reviewed and are detailed above.   PHYSICAL EXAMINATION:  GENERAL:  The patient is alert and oriented.  She  is a pleasant woman in no acute distress.  VITAL SIGNS:  Temperature 98.5, heart rate 91, respiratory rate 16,  blood pressure 112/54, oxygen saturation 98% on room air.  Weight 64.7  kg.  HEENT:  Normal.  NECK:  Normal carotid upstrokes.  No bruits.  JVP normal.  No  thyromegaly or thyroid nodules.  LUNGS:  Clear bilaterally.  HEART:  The apex is discreet and nondisplaced.  There is no right  ventricular heave or lift.  HEART:  Regular rate and rhythm without murmurs or gallops.  ABDOMEN:  Soft, nontender.  No organomegaly, no abdominal bruits.  BACK:  No CVA tenderness.  EXTREMITIES:  No clubbing, cyanosis or edema.  Peripheral pulses are 2+  and equal throughout.  SKIN:  Warm and dry without rash.  NEUROLOGIC:  Cranial nerves II-XII are intact.  Strength is 5/5 and  equal in the arms and legs.   CHEST X-RAY:  No active disease.   ELECTROCARDIOGRAM:  Normal sinus rhythm with borderline changes for LVH.  There are no significant ST or T-wave changes.   LABORATORY DATA:  Troponin has been less than 0.01 x2.  CK-MB is normal.  Glucose 94, creatinine 1.1, potassium 3.5, sodium 137.  Hemoglobin 11.4,  white blood cell count 5900, platelet count 167,000, cholesterol 160,  triglycerides 47,  HDL 81, LDL 70.   ASSESSMENT:  This is a 70 year old woman with a history of coronary  artery disease presenting with chest pain, which has both typical and  atypical features.  Her initial objective data is negative, and she has  ruled out for myocardial infarction with a normal EKG and cardiac  biomarkers.  It is concerning that her chest pain was nitrate responsive  and also had subjective features of her previous myocardial infarction.  Recommend inpatient adenosine Myoview stress scan to rule out  significant ischemia and to further risk stratify this patient.  She  clearly has multiple ongoing cardiac risk factors and needs aggressive  therapy.  It appears that her cholesterol is very well treated, and her  blood pressure is also a goal.  Will continue her current therapy for  now and await her Myoview results to determine her disposition.  If her  Myoview is normal or low risk, she should be eligible for discharge  tomorrow.   I appreciate the opportunity to see this very nice lady.  Please feel  free to call with any questions.      Juanda Bond. Burt Knack, MD  Electronically Signed     MDC/MEDQ  D:  02/07/2008  T:  02/07/2008  Job:  [id number]

## 2010-08-16 ENCOUNTER — Emergency Department (HOSPITAL_COMMUNITY): Payer: Medicare Other

## 2010-08-16 ENCOUNTER — Inpatient Hospital Stay (HOSPITAL_COMMUNITY)
Admission: EM | Admit: 2010-08-16 | Discharge: 2010-08-18 | DRG: 392 | Disposition: A | Discharge status: Home / Self Care | Payer: Medicare Other | Attending: Cardiovascular Disease | Admitting: Cardiovascular Disease

## 2010-08-16 DIAGNOSIS — Z79899 Other long term (current) drug therapy: Secondary | ICD-10-CM

## 2010-08-16 DIAGNOSIS — R0789 Other chest pain: Secondary | ICD-10-CM | POA: Diagnosis present

## 2010-08-16 DIAGNOSIS — E785 Hyperlipidemia, unspecified: Secondary | ICD-10-CM | POA: Diagnosis present

## 2010-08-16 DIAGNOSIS — Z7982 Long term (current) use of aspirin: Secondary | ICD-10-CM

## 2010-08-16 DIAGNOSIS — E119 Type 2 diabetes mellitus without complications: Secondary | ICD-10-CM | POA: Diagnosis present

## 2010-08-16 DIAGNOSIS — R079 Chest pain, unspecified: Secondary | ICD-10-CM

## 2010-08-16 DIAGNOSIS — K209 Esophagitis, unspecified without bleeding: Principal | ICD-10-CM | POA: Diagnosis present

## 2010-08-16 DIAGNOSIS — Z9861 Coronary angioplasty status: Secondary | ICD-10-CM

## 2010-08-16 DIAGNOSIS — I2510 Atherosclerotic heart disease of native coronary artery without angina pectoris: Secondary | ICD-10-CM | POA: Diagnosis present

## 2010-08-16 DIAGNOSIS — I252 Old myocardial infarction: Secondary | ICD-10-CM

## 2010-08-16 DIAGNOSIS — Z880 Allergy status to penicillin: Secondary | ICD-10-CM

## 2010-08-16 DIAGNOSIS — K219 Gastro-esophageal reflux disease without esophagitis: Secondary | ICD-10-CM | POA: Diagnosis present

## 2010-08-16 DIAGNOSIS — I10 Essential (primary) hypertension: Secondary | ICD-10-CM | POA: Diagnosis present

## 2010-08-16 HISTORY — DX: Essential (primary) hypertension: I10

## 2010-08-16 NOTE — H&P (Signed)
NAMEANNETTEE, WARKENTIN            ACCOUNT NO.:  [id number]   MEDICAL RECORD NO.:  [id number]          PATIENT TYPE:  OBV   LOCATION:  4707                         FACILITY:  [facility]   PHYSICIAN:  Rexene Alberts, M.D.    DATE OF BIRTH:  [birth year]   DATE OF ADMISSION:  01/07/2006  DATE OF DISCHARGE:                                HISTORY & PHYSICAL   PRIMARY CARE PHYSICIAN:  Dr. Harlene Ramus, [facility].   CHIEF COMPLAINT:  Chest pain and mouth pain.   HISTORY OF PRESENT ILLNESS:  The patient is a 70 year old lady with a past  medical history significant for coronary artery disease, diet-controlled  diabetes mellitus, and hyperlipidemia, who presents to the emergency  department with a chief complaint of mouth pain.  The patient says that  earlier yesterday, her partial denture became dislodged and came out.  This  was followed by bleeding from the site of the previous partial plate.  She  also experienced a moderate amount of pain.  She became nervous and  developed chest pain.  The chest pain is located substernally and to the  left.  The pain is described as  sharp.  Over the course of the day, the  pain has been intermittent and is not associated with activity or rest.  She  had associated nausea and a headache but no radiation, diaphoresis, or  pleurisy.  She did have shortness of breath which lasted for approximately  30 minutes and then went away.  In the past, she has taken sublingual  nitroglycerin as needed for chest pain.  However, with her episode of chest  pain yesterday, she decided not to take sublingual nitroglycerin.  The pain  was initially a 10/10 in intensity.  However, after being given aspirin and  morphine in the emergency department, it is now rated as a 2/10 in  intensity.  Her mouth pain has now resolved.  The patient wants to go home  but has agreed to stay for 24-hour observation to rule out cardiac ischemia  and/or a myocardial infarction.   During the evaluation in the emergency department, she is found to be  hemodynamically stable.  Her EKG reveals normal sinus rhythm, with no acute  abnormalities.  Her initial cardiac markers are negative.   PAST MEDICAL HISTORY:  1. Coronary artery disease, with a history of a myocardial infarction in      November 2004.  Status post angioplasty and stent.  The procedure was      performed in [place], [place].  2. Normal stress test approximately 8 months ago in [place], [place].  (Her cardiologist was Dr. Lucille Passy. [place].)  3. Hypertension.  4. Hyperlipidemia.  5. Type 2 diabetes mellitus, diet controlled.  6. Degenerative joint disease.  7. Status post bilateral cataract surgery, and status post bilateral lens      implant approximately one month ago.  8. Status post pelvic surgery x2 for an IUD removal and an ectopic      pregnancy in the past.  9. Status post C-section.  10.Status post cholecystectomy.  MEDICATIONS:  (The patient does not know the doses of any of her  medications.)  1. Lisinopril once daily.  2. Toprol-XL once daily.  3. Aspirin 81 mg daily.  4. Lidoderm patch as needed.  5. Sublingual nitroglycerin as needed.  6. Ophthalmic drops.  7. Simvastatin once daily.   ALLERGIES:  The patient has an allergy to PENICILLIN which causes a severe  rash and difficulty breathing.   SOCIAL HISTORY:  The patient is married.  She has five children.  She now  lives in [place], [place]. She moved from [place], [place]  eight months ago.  She is retired.  She stopped smoking approximately 20  years ago. She denies alcohol and illicit drug use.   FAMILY HISTORY:  Her mother is 62 years of age and has a history of heart  disease.  Her father died at 77 years of age of a heart attack.   REVIEW OF SYSTEMS:  The patient's review of systems is positive for  intermittent chest pain, anxiety, and arthritic pain in her legs and her  arms and  her hands.   PHYSICAL EXAMINATION:  VITAL SIGNS:  Temperature 97.9, blood pressure  124/74, pulse 66, respiratory rate 22, oxygen saturation 100% on 2 liters of  nasal cannula oxygen.  GENERAL:  The patient is a 70 year old, average-sized [place] American woman  who is currently lying in bed in no acute distress.  HEENT:  Head is normocephalic, nontraumatic.  Pupils equal, round, and  reactive to light.  Extraocular movements are intact.  Conjunctivae are  clear.  Sclerae are white.  Tympanic membrane are clear bilaterally.  Oropharynx reveals a loose partial plate at the location of the left lower  mouth.  No surrounding erythema, edema, or exudates.  No tenderness of the  mandible or the perioral region.  Mucous membranes are mildly dry.  No  posterior exudates or erythema.  NECK:  Supple.  No adenopathy, no thyromegaly, no bruit, no JVD.  LUNGS:  Clear to auscultation bilaterally.  HEART:  S1, S2, with a subtle systolic murmur.  ABDOMEN:  Mildly obese.  Positive bowel sounds.  Soft, nontender,  nondistended.  No hepatosplenomegaly.  Well-healed hypogastric scar.  EXTREMITIES:  Pedal pulses are 2+ bilaterally.  No pretibial edema.  No  pedal edema.  NEUROLOGIC/PSYCHIATRIC:  The patient is somewhat anxious.  However, she is  alert and oriented x3.  She is cooperative.  Cranial nerves II-XII are  intact.  Strength is 5/5 throughout.  Sensation is intact.   ADMISSION LABORATORIES:  EKG reveals normal sinus rhythm, with a heart rate  of 60 beats per minute.  Chest x-ray reveals cardiomegaly, with low lung  volumes and no active process.  Cardiac markers negative.  D-dimer is 0.32.  Sodium 139, potassium 4, chloride 110, bicarbonate 24, BUN 21, creatinine  1.4.  Hemoglobin 12.6, hematocrit 37.   ASSESSMENT:  1. Chest pain.  The patient's chest pain is somewhat atypical.  It     occurred following the dislodgement of her dental partial  plate, which      apparently caused the patient  some anxiety and pain.  The patient,      however, does have a significant cardiac history and will therefore be      admitted primarily to rule out a myocardial infarction.  2. Dislodgement of dental partial plate.  The patient has reinserted the      partial plate.  However, it is somewhat loose.  She will  need followup      with her primary care physician, who will need to refer her to an oral      surgeon.  3. Renal insufficiency.  The patient's baseline BUN and creatinine are      unknown.  Her creatinine is mildly elevated at 1.4.  She may have an      element of mild prerenal azotemia.  Of note, she is on ACE inhibitor      therapy.  4. Anxiety.  The patient has some anxiousness during the exam.  5. Hypertension.  The patient's blood pressure is well controlled      currently.  6. Hyperlipidemia.  7. Diet-controlled diabetes mellitus.   PLAN:  1. The patient will be admitted for 24-hour observation.  2. Will check cardiac enzymes q.8 h. x3.  If positive, will consult      cardiology.  3. Will continue sublingual nitroglycerin as needed.  Will start      prophylactic Lovenox.  Continue aspirin therapy.  Morphine as needed      for pain as well.  4. Continue Toprol-XL.  Hold lisinopril, and follow the patient's blood      pressure and renal function closely.  5. Gentle IV fluids.  6. P.r.n. Xanax.  7. In addition to assessing cardiac enzymes, will assess additional lab      data.  CMET, CBC, PTT, BNP, TSH, fasting lipid panel, homocysteine      level, and hemoglobin A1c are all pending.  8. Outpatient oral surgeon evaluation per the patient's primary care      physician.      Rexene Alberts, M.D.  Electronically Signed     DF/MEDQ  D:  01/08/2006  T:  01/08/2006  Job:  [id number]

## 2010-08-16 NOTE — Discharge Summary (Signed)
NAMETEISHA, KAZMIERSKI            ACCOUNT NO.:  [id number]   MEDICAL RECORD NO.:  [id number]          PATIENT TYPE:  OBV   LOCATION:  4707                         FACILITY:  [facility]   PHYSICIAN:  Vladimir Faster, MD        DATE OF BIRTH:  1940-08-23   DATE OF ADMISSION:  01/07/2006  DATE OF DISCHARGE:  01/08/2006                                 DISCHARGE SUMMARY   DISCHARGE DIAGNOSES:  1. Atypical chest pain.      a.     She had 3 sets of negative cardiac enzymes and an EKG, which       showed normal sinus rhythm.      b.     She had a normal stress test approximately 8 months ago, done in       [place], [place].  2. Hyperlipidemia.  3. Hypertension.  4. Type 2 diabetes mellitus.  5. Anxiety.   DISCHARGE MEDICATIONS:  1. Lisinopril once daily.  2. Toprol XL once daily.  3. Aspirin 81 mg daily.  4. Lidoderm patch as needed.  5. Sublingual nitroglycerin as needed.  6. Ophthalmic drops.  7. Simvastatin once daily.   She was asked to continue with the medications as the dose she was taking at  home.  She does not know the exact doses she was on these medications at  home.   HISTORY OF PRESENT ILLNESS:  Ms. Zahner is a 70 year old lady with a  history of diet-controlled diabetes mellitus, hyperlipidemia and  hypertension who came with a chief complaint of mouth pain.  She states her  partial denture became dislodged, and then came out, and she became nervous,  and she also complains of some chest pain with the nervousness, which is  located substernally.  Initially, in the ER her EKG did not reveal any  abnormalities.  She was admitted to rule out any cardiac abnormality.   HOSPITAL COURSE:  1. Atypical chest pain:  She was admitted for ruling out an acute coronary      syndrome.  She had 3 sets of cardiac enzymes, which were negative.  Her      troponins were 0.2, 0.3 and less than 0.5.  She also had a negative      stress test approximately 8 months ago, done in  [place], [place].  She had a negative EKG.  She will now be discharged to home      in a stable condition.  Most likely, her chest pain was      musculoskeletal.  She was advised to take Tylenol as needed for her      pain.  2. Diabetes mellitus, type 2:  In the hospital, we did a hemoglobin A1c on      her, which measured at 6.7, which denotes pretty well-controlled      diabetes.  3. Hypertension:  Her blood pressure was well controlled while she was in      the hospital.   DISPOSITION:  She will be discharged home in a stable condition.  She will  follow  up with her primary care doctor, Dr. Milagros Evener at the [facility], in approximately 2 weeks.      Vladimir Faster, MD  Electronically Signed     PKN/MEDQ  D:  01/08/2006  T:  01/09/2006  Job:  [id number]   cc:   Bill Salinas. Rankins, M.D.

## 2010-08-17 ENCOUNTER — Inpatient Hospital Stay (HOSPITAL_COMMUNITY): Payer: Medicare Other

## 2010-08-17 ENCOUNTER — Encounter (HOSPITAL_COMMUNITY): Payer: Self-pay | Admitting: Radiology

## 2010-08-17 LAB — BASIC METABOLIC PANEL
BUN: 15 mg/dL (ref 6–23)
CO2: 26 mEq/L (ref 19–32)
Calcium: 10 mg/dL (ref 8.4–10.5)
GFR calc non Af Amer: 49 mL/min — ABNORMAL LOW (ref >60)
Glucose, Bld: 103 mg/dL — ABNORMAL HIGH (ref 70–99)
Potassium: 4 mEq/L (ref 3.5–5.1)
Sodium: 140 mEq/L (ref 135–145)

## 2010-08-17 LAB — BASIC METABOLIC PANEL WITH GFR
Chloride: 103 meq/L (ref 96–112)
Creatinine, Ser: 1.11 mg/dL (ref 0.4–1.2)
GFR calc Af Amer: 59 mL/min — ABNORMAL LOW (ref >60)

## 2010-08-17 LAB — TROPONIN I
Troponin I: <0.3 ng/mL (ref <0.30)
Troponin I: <0.3 ng/mL (ref <0.30)
Troponin I: <0.3 ng/mL (ref <0.30)

## 2010-08-17 LAB — CBC
HCT: 35.1 % — ABNORMAL LOW (ref 36.0–46.0)
HCT: 35.4 % — ABNORMAL LOW (ref 36.0–46.0)
Hemoglobin: 11.8 g/dL — ABNORMAL LOW (ref 12.0–15.0)
Hemoglobin: 12.3 g/dL (ref 12.0–15.0)
MCH: 29.4 pg (ref 26.0–34.0)
MCH: 30.4 pg (ref 26.0–34.0)
MCHC: 33.6 g/dL (ref 30.0–36.0)
MCHC: 34.7 g/dL (ref 30.0–36.0)
MCV: 87.6 fL (ref 78.0–100.0)
Platelets: 121 K/uL — ABNORMAL LOW (ref 150–400)
RBC: 4.04 MIL/uL (ref 3.87–5.11)
RDW: 13.7 % (ref 11.5–15.5)
RDW: 13.9 % (ref 11.5–15.5)
WBC: 5.9 10*3/uL (ref 4.0–10.5)

## 2010-08-17 LAB — DIFFERENTIAL
Basophils Absolute: 0 10*3/uL (ref 0.0–0.1)
Basophils Relative: 0 % (ref 0–1)
Eosinophils Absolute: 0.2 K/uL (ref 0.0–0.7)
Eosinophils Relative: 4 % (ref 0–5)
Lymphocytes Relative: 26 % (ref 12–46)
Lymphs Abs: 1.5 K/uL (ref 0.7–4.0)
Monocytes Absolute: 0.8 K/uL (ref 0.1–1.0)
Monocytes Relative: 14 % — ABNORMAL HIGH (ref 3–12)
Neutro Abs: 3.3 10*3/uL (ref 1.7–7.7)
Neutrophils Relative %: 56 % (ref 43–77)

## 2010-08-17 LAB — GLUCOSE, CAPILLARY
Glucose-Capillary: 127 mg/dL — ABNORMAL HIGH (ref 70–99)
Glucose-Capillary: 104 mg/dL — ABNORMAL HIGH (ref 70–99)

## 2010-08-17 LAB — CK TOTAL AND CKMB (NOT AT ARMC)
CK, MB: 3.7 ng/mL (ref 0.3–4.0)
CK, MB: 4.5 ng/mL — ABNORMAL HIGH (ref 0.3–4.0)
Relative Index: 2 (ref 0.0–2.5)
Relative Index: 2.1 (ref 0.0–2.5)
Total CK: 221 U/L — ABNORMAL HIGH (ref 7–177)

## 2010-08-17 LAB — APTT: aPTT: 23 seconds — ABNORMAL LOW (ref 24–37)

## 2010-08-17 LAB — PROTIME-INR
INR: 0.96 (ref 0.00–1.49)
Prothrombin Time: 13 seconds (ref 11.6–15.2)

## 2010-08-17 MED ORDER — IOHEXOL 300 MG/ML  SOLN: 80.0000 mL | Freq: Once | INTRAMUSCULAR | Status: AC | PRN

## 2010-08-17 NOTE — H&P (Signed)
Evelyn Morrison, Evelyn Morrison            ACCOUNT NO.:  [id number]  MEDICAL RECORD NO.:  [id number]           PATIENT TYPE:  E  LOCATION:  [facility]                         FACILITY:  [facility]  PHYSICIAN:  Toya Smothers, MD       DATE OF BIRTH:  June 03, 1940  DATE OF ADMISSION:  08/17/2010 DATE OF DISCHARGE:                             HISTORY & PHYSICAL   PRIMARY CARDIOLOGIST:  Juanda Bond. Burt Knack, MD.  PATIENT LOCATION:  Evaluated in the emergency room.  CHIEF COMPLAINT:  Chest pain.  HISTORY OF PRESENT ILLNESS:  This is a 70 year old woman with a history of coronary artery disease in the past requiring PCI (her exact anatomy is unclear), diabetes mellitus, hypertension, and hyperlipidemia who presents with 2 days of chest pain episodes.  Of note, 2 days ago, she began taking oral antibiotics in advance of the dental procedure and has felt episodic chest pain and heartburn after taking pills.  This feeling was worse today after she took her pills and had a [place] sandwich with several tomato slices.  The pain was substernal with radiation to her right flank.  She also associates it with exertional dyspnea, though she denies nausea or diaphoresis.  She otherwise has been feeling well and has had no fevers, chills, or sweats or any sick contacts.  Out of concern and stress about what her symptoms might mean, namely if they were indicative of heart symptoms, she presents to the emergency room for further evaluation and management.  Specifically, one of her concerns centers around results of her past stress testing.  She claims to have had 2 recent stress tests, the results of which were reportedly conveyed to her in a somewhat concerning manner. The most recent stress result that I see in the system is from December 2011, a test that was ultimately reassuring.  PAST MEDICAL HISTORY: 1. Coronary artery disease with prior PCI.  Exact anatomy is unclear. 2. Diabetes mellitus. 3.  Hyperlipidemia. 4. Hypertension.  SOCIAL HISTORY:  The patient is a nonsmoker, and she is accompanied to the emergency room by her son and by her daughter.  FAMILY HISTORY:  Noncontributory at this presentation.  REVIEW OF SYSTEMS:  As per HPI, otherwise is comprehensively negative.  ALLERGIES:  PENICILLIN.  MEDICATIONS: 1. Aspirin 81 mg daily. 2. Lisinopril 20 mg daily. 3. Crestor 10 mg daily. 4. Metformin 500 mg p.o. daily. 5. Hydrochlorothiazide 12.5 mg daily.  PHYSICAL EXAMINATION:  GENERAL:  A comprehensive cardiovascular exam was performed. VITAL SIGNS:  The patient's vital signs are within normal limits.  She is afebrile with a blood pressure of 136/85 and a heart rate in the 70s. She is breathing comfortably with a respiratory rate of 16 and satting 98% room air. NECK:  Notable for supple neck with no masses or lymphadenopathy.  JVP is not appreciably elevated. CARDIAC:  Notable for normal S1 and S2 with no murmurs, rubs, or gallops appreciated. LUNGS:  Clear to auscultation bilaterally with scattered bibasilar crackles. ABDOMEN:  Soft, nontender, nondistended with no bruits appreciated. EXTREMITIES:  Warm and well perfused with no evidence of lower extremity edema. NEUROLOGIC:  She is alert and  oriented x3 with no focal neurologic deficits detected.  Of note, she does appear to be fairly anxious, though re-directable.  LABORATORY EVALUATION:  Her white count is 5000, hematocrit 35, platelets somewhat low at 121,000.  Her sodium is 140, potassium 4.0, chloride 103, bicarb 26, BUN 15, creatinine 1.1 with a glucose of 103. Initial set of troponins was negative with a CK of 221 and a CK-MB of 4.5.  Her INR is 0.96.  Her ECG in the emergency room demonstrates normal sinus rhythm with no ST-segment abnormalities present.  Review of her electronic medical record reveals a nuclear perfusion stress test in November 2009, which demonstrated no ischemia or infarction  and an ejection fraction of greater than 60%.  Stress echocardiogram in December 2011 showed a small area of distal septal ischemia, but otherwise a largely negative study.  IMPRESSION:  This is a 70 year old woman with coronary history presenting with chest discomfort, which seems most likely pill and reflux related with a contribution from stress and anxiety.  Her coronary history obligate the thought for rule out, but reassuringly thus far she has had no enzymatic or electrocardiographic evidence of active ischemia.  PLANS:  She will be admitted to telemetry on intravenous heparin for serial enzyme draws.  Once her trend is negative and reassuring, her heparin can and will be discontinued.  I have a low suspicion for this being an acute coronary syndrome, but her known disease warrants careful evaluation.  Additional reassurance could be provided to her with clarification of her stress test results and of their timing.  It appears that she had a CT scan of her abdomen and pelvis in March 2012, but there is no record of any stress testing.  She may be confused about the timing of the various tests that she has had.  My review reveals a most recent stress test in December 2011, which took the form of a stress echocardiogram whose results are described above.  Based on that result, she does not appear to have a significant area of inducible ischemia present.  As long as she rules out from a cardiac biomarker standpoint during this admission, I think outpatient follow up with Dr. Burt Knack would be appropriate.  Given the potential likelihood for a GI related explanation for her symptoms in the last couple of days, we will administer her a GI cocktail in the form of Mylanta and follow her symptoms closely.  The plan was explained to the patient and to her children.  All parties voiced understanding and I answered their questions to the best of my ability.           ______________________________ Toya Smothers, MD     DM/MEDQ  D:  08/17/2010  T:  08/17/2010  Job:  [id number]  Electronically Signed by Toya Smothers MD on 08/17/2010 07:21:54 AM

## 2010-08-18 DIAGNOSIS — R079 Chest pain, unspecified: Secondary | ICD-10-CM

## 2010-08-18 LAB — HEPARIN LEVEL (UNFRACTIONATED): Heparin Unfractionated: 0.98 IU/mL — ABNORMAL HIGH (ref 0.30–0.70)

## 2010-08-18 LAB — GLUCOSE, CAPILLARY
Glucose-Capillary: 136 mg/dL — ABNORMAL HIGH (ref 70–99)
Glucose-Capillary: 118 mg/dL — ABNORMAL HIGH (ref 70–99)

## 2010-08-18 LAB — CBC
Hemoglobin: 11.3 g/dL — ABNORMAL LOW (ref 12.0–15.0)
MCH: 29.1 pg (ref 26.0–34.0)
Platelets: 163 10*3/uL (ref 150–400)
RBC: 3.88 MIL/uL (ref 3.87–5.11)
WBC: 4.6 10*3/uL (ref 4.0–10.5)

## 2010-08-23 NOTE — Discharge Summary (Signed)
Evelyn Morrison, Evelyn Morrison            ACCOUNT NO.:  [id number]  MEDICAL RECORD NO.:  [id number]           PATIENT TYPE:  I  LOCATION:  [id number]                         FACILITY:  [facility]  PHYSICIAN:  Thompson Grayer, MD       DATE OF BIRTH:  09-09-1940  DATE OF ADMISSION:  08/16/2010 DATE OF DISCHARGE:  08/18/2010                              DISCHARGE SUMMARY   PRIMARY CARE PROVIDER:  [facility].  PRIMARY CARDIOLOGIST:  Juanda Bond. Burt Knack, MD  REASON FOR ADMISSION:  Chest pain.  DISCHARGE DIAGNOSES: 1. Chest pain, etiology likely secondary to esophagitis versus     gastroesophageal reflux disease. 2. Coronary artery disease, status post prior percutaneous coronary     intervention.     a.     Myocardial infarction ruled out this admission. 3. Diabetes mellitus. 4. Hypertension. 5. Hyperlipidemia. 6. Abnormal chest x-ray.     a.     Followup chest CT normal.  ADMISSION HISTORY:  The patient is a 70 year old female patient with a history of coronary artery disease status post prior PCI, who presented with 2 days of chest pain.  This began shortly after taking antibiotics in preparation for a dental procedure.  She also noted some exertional dyspnea.  She was admitted for further evaluation.  HOSPITAL COURSE:  The patient ruled out for myocardial infarction by enzymes.  She noted continued symptoms with swallowing liquids or solids.  She was placed on proton pump inhibitor.  Chest x-ray was worrisome for left lung nodule.  Chest CT was recommended and this was done with contrast on May 19 and demonstrated no suspicious pulmonary nodules.  On the morning of discharge, the patient was free of chest pain, shortness of breath.  Her blood pressure was noted to be somewhat elevated at Dr. Rayann Heman, who recommended increasing her lisinopril/ hydrochlorothiazide to 20/25 mg a day.  It was also recommended she remain on a proton pump inhibitor for twice a day for 2 weeks and then once daily  thereafter.  She is felt to be stable enough for discharge to home with followup with her primary care provider as well as Dr. Burt Knack, in the next 2-4 weeks.  LABORATORY DATA:  Hemoglobin 11.3, MCV 86.9, potassium 4, glucose 103, creatinine 1.11.  Cardiac enzymes negative x3.  Chest x-ray, cardiomegaly without pulmonary edema, question of left lung nodule, chest CT as outlined above with suspicious pulmonary nodules.  ALLERGIES:  PENICILLIN.  DISCHARGE MEDICATIONS: 1. Milk of magnesia as needed. 2. Protonix 40 mg twice daily for 2 weeks and once daily thereafter. 3. Lisinopril/hydrochlorothiazide 20/25 mg daily. 4. Acetaminophen 5 mg b.i.d. p.r.n. 5. Aspirin 81 mg daily. 6. Clindamycin as directed by her dentist. 7. Crestor 10 mg daily. 8. Caltrate OTC twice daily. 9. Hydrocodone/APAP 5/500 mg every 4-6 hours p.r.n. 10.Metformin 500 mg daily - she is to restart this on Aug 25, 2010. 11.MiraLax 17 g daily as directed. 12.Multivitamins daily. 13.Nitroglycerin p.r.n. chest pain. 14.Oxybutynin 5 mg b.i.d. 15.Triamcinolone 1% cream apply daily.  ACTIVITY:  She is to increase her activity slowly.  DIET:  Low-fat, low-sodium diabetic diet.  WOUND CARE:  Not applicable.  FOLLOWUP: 1. Dr. Burt Knack, or the Physician Assistant in 2-4 weeks and office     will contact her for an appointment. 2. She should contact [facility] for followup in the next several     weeks.  Total physician and PA time greater than 30 minutes.     Richardson Dopp, PA-C   ______________________________ Thompson Grayer, MD    SW/MEDQ  D:  08/18/2010  T:  08/18/2010  Job:  [id number]  cc:   Karoline Caldwell  Electronically Signed by Richardson Dopp PA-C on 08/18/2010 04:04:26 PM Electronically Signed by Thompson Grayer MD on 08/23/2010 05:55:00 PM

## 2010-09-11 ENCOUNTER — Encounter: Payer: Self-pay | Admitting: Physician Assistant

## 2010-09-11 ENCOUNTER — Ambulatory Visit (INDEPENDENT_AMBULATORY_CARE_PROVIDER_SITE_OTHER): Payer: Medicare Other | Admitting: Physician Assistant

## 2010-09-11 ENCOUNTER — Encounter: Payer: Self-pay | Admitting: Gastroenterology

## 2010-09-11 VITALS — BP 120/76 | HR 70 | Resp 18 | Ht 61.0 in | Wt 138.0 lb

## 2010-09-11 DIAGNOSIS — I10 Essential (primary) hypertension: Secondary | ICD-10-CM

## 2010-09-11 DIAGNOSIS — E785 Hyperlipidemia, unspecified: Secondary | ICD-10-CM

## 2010-09-11 DIAGNOSIS — I2510 Atherosclerotic heart disease of native coronary artery without angina pectoris: Secondary | ICD-10-CM

## 2010-09-11 DIAGNOSIS — R079 Chest pain, unspecified: Secondary | ICD-10-CM

## 2010-09-11 DIAGNOSIS — K219 Gastro-esophageal reflux disease without esophagitis: Secondary | ICD-10-CM

## 2010-09-11 LAB — BASIC METABOLIC PANEL
Calcium: 9.8 mg/dL (ref 8.4–10.5)
GFR: 49.2 mL/min — ABNORMAL LOW (ref >60.00)
Potassium: 4 mEq/L (ref 3.5–5.1)
Sodium: 140 mEq/L (ref 135–145)

## 2010-09-11 MED ORDER — OMEPRAZOLE 40 MG PO CPDR: 40.0000 mg | DELAYED_RELEASE_CAPSULE | Freq: Every day | ORAL | Status: DC

## 2010-09-11 NOTE — Progress Notes (Signed)
History of Present Illness: Primary Cardiologist: Dr. Sherren Mocha  Adamarys Andries is a 70 y.o. female with a history of CAD, status post prior MI and prior PCI in 2004 at another hospital.  Myoview study done 2/12 demonstrated an EF of 59%, small apical reversible perfusion defect, question ischemia versus shifting breast attenuation artifact.  The study was felt to be low risk and medical therapy was continued.  She recently was admitted 5/18-5/20 with chest pain.  Her symptoms had gastrointestinal overtones.  She was started on a proton pump inhibitor.  She ruled out for myocardial infarction.  A chest x-ray was questionable for a lung nodule.  Chest CT demonstrated no lung nodule.  Her blood pressure medications were adjusted.  She returns for follow up today.  She denies chest pain with exertion.  She denies significant shortness of breath.  She denies syncope.  She denies orthopnea, PND or edema.  She needs a dental procedure performed.  She apparently needs cardiac clearance.  She continues to have a burning sensation when she swallows sometimes.  She notes some improvement with the proton pump inhibitor.  Past Medical History  Diagnosis Date  . Hypertension   . Diabetes mellitus   . CAD (coronary artery disease)   . DM (diabetes mellitus)   . HLD (hyperlipidemia)   . Chest pain   . Heart burn     Current Outpatient Prescriptions  Medication Sig Dispense Refill  . acetaminophen (TYLENOL) 325 MG tablet Take 650 mg by mouth every 6 (six) hours as needed.        Marland Kitchen aspirin 81 MG EC tablet Take 81 mg by mouth daily.        . Calcium Carbonate-Vitamin D (CALTRATE 600+D) 600-400 MG-UNIT per tablet Take 1 tablet by mouth 2 (two) times daily.        Marland Kitchen HYDROcodone-acetaminophen (VICODIN) 5-500 MG per tablet Take 1 tablet by mouth every 6 (six) hours as needed.        Marland Kitchen lisinopril-hydrochlorothiazide (PRINZIDE,ZESTORETIC) 20-25 MG per tablet Take 1 tablet by mouth daily.        . metFORMIN  (GLUCOPHAGE) 500 MG tablet Take 500 mg by mouth daily with breakfast.        . Multiple Vitamin (MULTIVITAMIN) tablet Take 1 tablet by mouth daily.        . nitroGLYCERIN (NITROSTAT) 0.4 MG SL tablet Place 0.4 mg under the tongue every 5 (five) minutes as needed.        Marland Kitchen oxybutynin (DITROPAN) 5 MG tablet Take 5 mg by mouth 2 (two) times daily.        . polyethylene glycol powder (MIRALAX) powder Take 17 g by mouth daily as needed.        . rosuvastatin (CRESTOR) 10 MG tablet Take 10 mg by mouth daily.        Marland Kitchen triamcinolone (KENALOG) 0.1 % cream Apply 1 application topically as directed.        Marland Kitchen DISCONTD: pantoprazole (PROTONIX) 40 MG tablet Take 40 mg by mouth daily.       Marland Kitchen omeprazole (PRILOSEC) 40 MG capsule Take 1 capsule (40 mg total) by mouth daily.  30 capsule  11  . DISCONTD: aspirin 81 MG chewable tablet Chew 81 mg by mouth daily.        Marland Kitchen DISCONTD: magnesium hydroxide (MILK OF MAGNESIA) 400 MG/5ML suspension Take by mouth daily as needed.          Allergies: Allergies  Allergen Reactions  .  Milk-Related Compounds     REACTION: Stomach-ache  . Penicillins     Vital Signs: BP 120/76  Pulse 70  Resp 18  Ht 5\' 1"  (1.549 m)  Wt 138 lb (62.596 kg)  BMI 26.07 kg/m2  PHYSICAL EXAM: Well nourished, well developed, in no acute distress HEENT: normal Neck: no JVD Cardiac:  normal S1, S2; RRR; no murmur Lungs:  clear to auscultation bilaterally, no wheezing, rhonchi or rales Abd: soft, nontender, no hepatomegaly Ext: no edema Skin: warm and dry Neuro:  CNs 2-12 intact, no focal abnormalities noted  EKG:  Sinus rhythm, heart rate 70, normal axis, nonspecific ST-T wave changes  ASSESSMENT AND PLAN:

## 2010-09-11 NOTE — Assessment & Plan Note (Addendum)
Overall stable.  She needs clearance for a dental procedure.  She had a low risk Myoview study in February 2012.  She can achieve more than 4 METS without anginal symptoms.  She has no unstable cardiac conditions.  She should be able to proceed with her dental procedure at low risk.  Continue aspirin statin.  Follow up with Dr. Burt Knack in 4 months.

## 2010-09-11 NOTE — Assessment & Plan Note (Signed)
This was likely related to acid reflux.  Refer to gastroenterology as noted.

## 2010-09-11 NOTE — Assessment & Plan Note (Signed)
She is having trouble affording protonix.  I will switch her to Prilosec 40 mg a day.  She still has, essentially, odynophagia.  I will refer her to gastroenterology.

## 2010-09-11 NOTE — Assessment & Plan Note (Signed)
Managed by PCP

## 2010-09-11 NOTE — Assessment & Plan Note (Signed)
Controlled.  Continue current therapy.  Check a basic metabolic panel today to follow up on renal function and potassium.

## 2010-09-11 NOTE — Patient Instructions (Signed)
Your physician has recommended you make the following change in your medication: STOP PROTONIX; AND START PRILOSEC 40 MG ONCE DAILY.  You have been referred to GASTROENTEROLOGY FOR GERD  Your physician recommends that you return for lab work in: [place] BMET 401.9  Your physician recommends that you schedule a follow-up appointment in: 3-4 WEEKS WITH DR. Burt Knack AS PER SCOTT WEAVER, PA-C.

## 2010-09-12 ENCOUNTER — Telehealth: Payer: Self-pay | Admitting: Cardiovascular Disease

## 2010-09-12 DIAGNOSIS — I10 Essential (primary) hypertension: Secondary | ICD-10-CM

## 2010-09-12 NOTE — Telephone Encounter (Signed)
Pt coming in 09/25/10 for repeat labs, pt called and gave dental information, advised that I will fax ov note to dentist Otilio Connors today [phone] [facility]

## 2010-09-12 NOTE — Telephone Encounter (Signed)
Per pt calling back with information Dr. Margit Hanks dental care office # [phone]. Fax # [phone].

## 2010-09-25 ENCOUNTER — Other Ambulatory Visit: Payer: Medicare Other | Admitting: *Deleted

## 2010-10-10 ENCOUNTER — Encounter: Payer: Self-pay | Admitting: Physician Assistant

## 2010-10-15 ENCOUNTER — Ambulatory Visit: Payer: Medicare Other | Admitting: Physician Assistant

## 2010-10-16 ENCOUNTER — Encounter: Payer: Self-pay | Admitting: *Deleted

## 2010-10-16 ENCOUNTER — Ambulatory Visit (INDEPENDENT_AMBULATORY_CARE_PROVIDER_SITE_OTHER): Payer: Medicare Other | Admitting: Physician Assistant

## 2010-10-16 DIAGNOSIS — I2510 Atherosclerotic heart disease of native coronary artery without angina pectoris: Secondary | ICD-10-CM

## 2010-10-16 DIAGNOSIS — I10 Essential (primary) hypertension: Secondary | ICD-10-CM

## 2010-10-16 DIAGNOSIS — R55 Syncope and collapse: Secondary | ICD-10-CM | POA: Insufficient documentation

## 2010-10-16 DIAGNOSIS — E785 Hyperlipidemia, unspecified: Secondary | ICD-10-CM

## 2010-10-16 NOTE — Progress Notes (Signed)
History of Present Illness: Primary Cardiologist: Dr. Sherren Mocha  Evelyn Morrison is a 70 y.o. female with a history of CAD, status post prior MI and prior PCI in 2004 at another hospital.  Myoview study done 2/12 demonstrated an EF of 59%, small apical reversible perfusion defect, question ischemia versus shifting breast attenuation artifact.  The study was felt to be low risk and medical therapy was continued.  She was admitted 5/12 with chest pain.  Her symptoms had gastrointestinal overtones.  She was started on a proton pump inhibitor.  She ruled out for myocardial infarction.  A chest x-ray was questionable for a lung nodule.  Chest CT demonstrated no lung nodule.  Her blood pressure medications were adjusted.  I saw her a month ago.  She improved on proton pump inhibitor therapy.  I referred her to gastroenterology.  She returns for followup.  She is doing well.  She sees GI next week.  No chest pain.  No dyspnea, PND, orthopnea or edema.  She gets dehydrated easily and has had syncope in the past.  She has only noted these episodes with extreme heat.  Otherwise, she denies syncope.  Her PCP left [facility].  She is having problems getting seen now and is trying to change her PCP on her Medicaid card.  Past Medical History  Diagnosis Date  . Hypertension   . Diabetes mellitus   . CAD (coronary artery disease)   . DM (diabetes mellitus)   . HLD (hyperlipidemia)   . Chest pain   . Heart burn   . Fatigue   . Constipation   . Contusion of arm   . Joint stiffness of hand   . Urinary incontinence   . Vitamin D deficiency   . Anemia   . Arthritis   . Cataract, diabetic   . Diabetic retinopathy     Current Outpatient Prescriptions  Medication Sig Dispense Refill  . acetaminophen (TYLENOL) 325 MG tablet Take 650 mg by mouth every 6 (six) hours as needed.        Marland Kitchen aspirin 81 MG EC tablet Take 81 mg by mouth daily.        . Calcium Carbonate-Vitamin D (CALTRATE 600+D) 600-400  MG-UNIT per tablet Take 1 tablet by mouth 2 (two) times daily.        Marland Kitchen HYDROcodone-acetaminophen (VICODIN) 5-500 MG per tablet Take 1 tablet by mouth every 6 (six) hours as needed.        Marland Kitchen lisinopril-hydrochlorothiazide (PRINZIDE,ZESTORETIC) 20-25 MG per tablet Take 1 tablet by mouth daily.        . metFORMIN (GLUCOPHAGE) 500 MG tablet Take 500 mg by mouth daily with breakfast.        . Multiple Vitamin (MULTIVITAMIN) tablet Take 1 tablet by mouth daily.        . nitroGLYCERIN (NITROSTAT) 0.4 MG SL tablet Place 0.4 mg under the tongue every 5 (five) minutes as needed.        Marland Kitchen omeprazole (PRILOSEC) 40 MG capsule Take 1 capsule (40 mg total) by mouth daily.  30 capsule  11  . oxybutynin (DITROPAN) 5 MG tablet Take 5 mg by mouth 2 (two) times daily.        . polyethylene glycol powder (MIRALAX) powder Take 17 g by mouth daily as needed.        . rosuvastatin (CRESTOR) 10 MG tablet Take 10 mg by mouth daily.        Marland Kitchen triamcinolone (KENALOG) 0.1 % cream Apply  1 application topically as directed.          Allergies: Allergies  Allergen Reactions  . Milk-Related Compounds     REACTION: Stomach-ache  . Penicillins     Vital Signs: BP 115/82  Pulse 93  Resp 16  Ht 5' (1.524 m)  Wt 135 lb (61.236 kg)  BMI 26.37 kg/m2  PHYSICAL EXAM: Well nourished, well developed, in no acute distress HEENT: normal Neck: no JVD Cardiac:  normal S1, S2; RRR; no murmur Lungs:  clear to auscultation bilaterally, no wheezing, rhonchi or rales Abd: soft, nontender, no hepatomegaly Ext: no edema Skin: warm and dry Neuro:  CNs 2-12 intact, no focal abnormalities noted   ASSESSMENT AND PLAN:

## 2010-10-16 NOTE — Assessment & Plan Note (Signed)
Managed by PCP

## 2010-10-16 NOTE — Assessment & Plan Note (Signed)
Repeat BMET today.  BP controlled.

## 2010-10-16 NOTE — Patient Instructions (Signed)
Your physician wants you to follow-up in: 6 months with Dr. Burt Knack. You will receive a reminder letter in the mail two months in advance. If you don't receive a letter, please call our office to schedule the follow-up appointment.  Your physician recommends that you return for lab work in: [place].9

## 2010-10-16 NOTE — Assessment & Plan Note (Signed)
She is describing orthostasis in the setting of extreme heat.  Her symptoms are stable and consistent with extreme temperatures.  We discussed the importance of hydration and to avoid going out on days where there are heat warnings.  She knows to follow up sooner if there is a change in her symptoms.

## 2010-10-16 NOTE — Assessment & Plan Note (Signed)
Stable.  Continue ASA.  Follow up with Dr. Burt Knack in 6 months.

## 2010-10-17 LAB — BASIC METABOLIC PANEL
CO2: 25 mEq/L (ref 19–32)
Chloride: 104 mEq/L (ref 96–112)
Creatinine, Ser: 1.3 mg/dL — ABNORMAL HIGH (ref 0.4–1.2)
Potassium: 4.1 mEq/L (ref 3.5–5.1)
Sodium: 140 mEq/L (ref 135–145)

## 2010-10-18 ENCOUNTER — Telehealth: Payer: Self-pay | Admitting: *Deleted

## 2010-10-18 NOTE — Telephone Encounter (Signed)
Pt aware of lab results today. Evelyn Morrison  

## 2010-10-22 ENCOUNTER — Ambulatory Visit (INDEPENDENT_AMBULATORY_CARE_PROVIDER_SITE_OTHER): Payer: Medicare Other | Admitting: Gastroenterology

## 2010-10-22 ENCOUNTER — Encounter: Payer: Self-pay | Admitting: Gastroenterology

## 2010-10-22 ENCOUNTER — Other Ambulatory Visit (INDEPENDENT_AMBULATORY_CARE_PROVIDER_SITE_OTHER): Payer: Medicare Other

## 2010-10-22 DIAGNOSIS — Z9049 Acquired absence of other specified parts of digestive tract: Secondary | ICD-10-CM

## 2010-10-22 DIAGNOSIS — R1310 Dysphagia, unspecified: Secondary | ICD-10-CM

## 2010-10-22 DIAGNOSIS — K219 Gastro-esophageal reflux disease without esophagitis: Secondary | ICD-10-CM

## 2010-10-22 DIAGNOSIS — R109 Unspecified abdominal pain: Secondary | ICD-10-CM

## 2010-10-22 DIAGNOSIS — R6889 Other general symptoms and signs: Secondary | ICD-10-CM

## 2010-10-22 DIAGNOSIS — D509 Iron deficiency anemia, unspecified: Secondary | ICD-10-CM

## 2010-10-22 DIAGNOSIS — T50904A Poisoning by unspecified drugs, medicaments and biological substances, undetermined, initial encounter: Secondary | ICD-10-CM

## 2010-10-22 DIAGNOSIS — K5903 Drug induced constipation: Secondary | ICD-10-CM

## 2010-10-22 DIAGNOSIS — Z9089 Acquired absence of other organs: Secondary | ICD-10-CM

## 2010-10-22 DIAGNOSIS — K5909 Other constipation: Secondary | ICD-10-CM

## 2010-10-22 LAB — FOLATE: Folate: 23.5 ng/mL (ref >5.9)

## 2010-10-22 LAB — FERRITIN: Ferritin: 60.4 ng/mL (ref 10.0–291.0)

## 2010-10-22 LAB — IBC PANEL
Iron: 57 ug/dL (ref 42–145)
Saturation Ratios: 14.2 % — ABNORMAL LOW (ref 20.0–50.0)
Transferrin: 286.5 mg/dL (ref 212.0–360.0)

## 2010-10-22 LAB — VITAMIN B12: Vitamin B-12: 1457 pg/mL — ABNORMAL HIGH (ref 211–911)

## 2010-10-22 MED ORDER — PEG-KCL-NACL-NASULF-NA ASC-C 100 G PO SOLR: 1.0000 | Freq: Once | ORAL | Status: DC

## 2010-10-22 NOTE — Patient Instructions (Signed)
Please go to the basement today for your labs.  Your prescription(s) have been sent to you pharmacy.  Your procedure has been scheduled for 10/23/2010, please follow the seperate instructions.

## 2010-10-22 NOTE — Progress Notes (Signed)
History of Present Illness:  This is a pleasant 70 year old Caucasian female referred from [facility] evaluation of 7 and 8 months of mid abdominal pain, acid reflux symptoms, and advancing constipation. This patient is very complex and has multiple medical problems including hypertension, osteoporosis, adult onset diabetes, coronary artery disease with previous angioplasty and stent placement, and multiple surgical procedures including cholecystectomy and had partial hysterectomy. There is a long history of NSAID abuse with resultant dyspepsia and epigastric pain alleviated somewhat by Prozac 20 mg a day which she has been on now for several weeks. Her chronic constipation seems to be improved with MiraLax 8 ounces at bedtime. There is no history of melena, rectal bleeding, anorexia, weight loss, or other systemic complaints. She has diffuse abdominal pain which is described as a sharp periodic sensation without real precipitating or alleviating elements. There is no history of specific hepatobiliary complaints. He has not had previous endoscopy or colonoscopy. Review of previous CT scan of the abdomen shows a possible small bowel lipoma versus dermoid pelvic lesion, nephrolithiasis, small hepatic cysts, and diverticulosis. Currently the patient denies nausea and vomiting, or other symptoms of bowel obstruction. Family history is remarkable for Crohn's disease in her brother. The patient is on aspirin 81 mg a day but not other anticoagulants.  I have reviewed this patient's present history, medical and surgical past history, allergies and medications.     ROS: The remainder of the 10 point ROS is negative.. she does complain of anxiety, depression, chronic fatigue, itching, muscle cramps, excessive urination.  Past Medical History  Diagnosis Date  . Hypertension   . Diabetes mellitus   . CAD (coronary artery disease)   . DM (diabetes mellitus)   . HLD (hyperlipidemia)   . Chest pain   . Heart  burn   . Fatigue   . Constipation   . Contusion of arm   . Joint stiffness of hand   . Urinary incontinence   . Vitamin D deficiency   . Anemia   . Arthritis   . Cataract, diabetic   . Diabetic retinopathy   . Glaucoma    Past Surgical History  Procedure Date  . Angioplasty and stent 10/04  . Cardiac catheterization 10/04    s/p stent LV 45%  . Adenosine cardillite 05/16/05    negative signif. myocardial ischemic  . C/s ectopic pregnancy 84, 86  . Cholecystectomy 1998  . Cataract extraction     bilateral  . Partial hysterectomy     reports that she quit smoking about 13 years ago. She has never used smokeless tobacco. She reports that she does not drink alcohol or use illicit drugs. family history includes Crohn's disease in her brother; Diabetes in an unspecified family member; Heart attack in her father; and Heart disease in her mother.  There is no history of Colon cancer. Allergies  Allergen Reactions  . Milk-Related Compounds     REACTION: Stomach-ache  . Penicillins         Physical Exam: General well developed well nourished patient in no acute distress, appearing her stated age Eyes PERRLA, no icterus, fundoscopic exam per opthamologist Skin no lesions noted Neck supple, no adenopathy, no thyroid enlargement, no tenderness Chest clear to percussion and auscultation Heart no significant murmurs, gallops or rubs noted Abdomen no hepatosplenomegaly masses or tenderness, BS normal.  Rectal inspection normal no fissures, or fistulae noted.  No masses or tenderness on digital exam. Stool guaiac negative. Extremities no acute joint lesions, edema, phlebitis  or evidence of cellulitis. Neurologic patient oriented x 3, cranial nerves intact, no focal neurologic deficits noted. Psychological mental status normal and normal affect.  Assessment and plan: Probable NSAID damage to upper and lower bowel, currently better on PPI therapy. I've scheduled endoscopy and  colonoscopy for diagnostic purposes. Mentioned above is a family history of inflammatory bowel disease. I continue Prilosec therapy, MiraLax for constipation, and proceed with propofol sedation for her multiple medical problems and history of coronary artery disease and previous stent. Also the patient is on regular Vicodin chronic pain syndrome. Cranial labs have been ordered for review. Otherwise she is to continue her medications per primary care. I suspect her constipation is in part related to her chronic Vicodin use. She has diffuse degenerative arthritis with recent negative rheumatoid factor. It is unclear to me why she is on narcotic therapy at this time. We will make adjustments in her diabetic medication for her procedures.  Encounter Diagnoses  Name Primary?  Marland Kitchen Dysphagia   . Abdominal pain   . Iron deficiency anemia, unspecified    . Other general symptoms

## 2010-10-23 ENCOUNTER — Encounter: Payer: Self-pay | Admitting: Gastroenterology

## 2010-10-23 ENCOUNTER — Ambulatory Visit (AMBULATORY_SURGERY_CENTER): Payer: Medicare Other | Admitting: Gastroenterology

## 2010-10-23 VITALS — BP 118/74 | HR 60 | Resp 18 | Ht 61.0 in | Wt 137.0 lb

## 2010-10-23 DIAGNOSIS — K589 Irritable bowel syndrome without diarrhea: Secondary | ICD-10-CM | POA: Insufficient documentation

## 2010-10-23 DIAGNOSIS — R1310 Dysphagia, unspecified: Secondary | ICD-10-CM | POA: Insufficient documentation

## 2010-10-23 DIAGNOSIS — R109 Unspecified abdominal pain: Secondary | ICD-10-CM

## 2010-10-23 DIAGNOSIS — K222 Esophageal obstruction: Secondary | ICD-10-CM | POA: Insufficient documentation

## 2010-10-23 DIAGNOSIS — K5909 Other constipation: Secondary | ICD-10-CM

## 2010-10-23 DIAGNOSIS — K5900 Constipation, unspecified: Secondary | ICD-10-CM

## 2010-10-23 DIAGNOSIS — K2940 Chronic atrophic gastritis without bleeding: Secondary | ICD-10-CM

## 2010-10-23 DIAGNOSIS — R198 Other specified symptoms and signs involving the digestive system and abdomen: Secondary | ICD-10-CM

## 2010-10-23 DIAGNOSIS — K2970 Gastritis, unspecified, without bleeding: Secondary | ICD-10-CM

## 2010-10-23 DIAGNOSIS — K219 Gastro-esophageal reflux disease without esophagitis: Secondary | ICD-10-CM

## 2010-10-23 LAB — GLUCOSE, CAPILLARY: Glucose-Capillary: 85 mg/dL (ref 70–99)

## 2010-10-23 MED ORDER — SODIUM CHLORIDE 0.9 % IV SOLN: 500.0000 mL | INTRAVENOUS | Status: DC

## 2010-10-24 ENCOUNTER — Telehealth: Payer: Self-pay | Admitting: *Deleted

## 2010-10-24 ENCOUNTER — Encounter: Payer: Self-pay | Admitting: *Deleted

## 2010-10-24 DIAGNOSIS — K219 Gastro-esophageal reflux disease without esophagitis: Secondary | ICD-10-CM

## 2010-10-24 DIAGNOSIS — R1310 Dysphagia, unspecified: Secondary | ICD-10-CM

## 2010-10-24 DIAGNOSIS — K2990 Gastroduodenitis, unspecified, without bleeding: Secondary | ICD-10-CM

## 2010-10-24 DIAGNOSIS — R109 Unspecified abdominal pain: Secondary | ICD-10-CM

## 2010-10-24 DIAGNOSIS — K2970 Gastritis, unspecified, without bleeding: Secondary | ICD-10-CM

## 2010-10-24 LAB — HELICOBACTER PYLORI SCREEN-BIOPSY: UREASE: NEGATIVE

## 2010-10-24 NOTE — Telephone Encounter (Signed)
No ID on answering machine. 

## 2010-10-24 NOTE — Telephone Encounter (Signed)
Notified pt of appt. on 11/12/10 at 10:30am; pt stated understanding.

## 2010-10-28 ENCOUNTER — Encounter: Payer: Self-pay | Admitting: Gastroenterology

## 2010-10-29 ENCOUNTER — Encounter: Payer: Self-pay | Admitting: Gastroenterology

## 2010-11-11 ENCOUNTER — Encounter: Payer: Self-pay | Admitting: *Deleted

## 2010-11-12 ENCOUNTER — Encounter: Payer: Self-pay | Admitting: Gastroenterology

## 2010-11-12 ENCOUNTER — Ambulatory Visit (INDEPENDENT_AMBULATORY_CARE_PROVIDER_SITE_OTHER): Payer: Medicare Other | Admitting: Gastroenterology

## 2010-11-12 VITALS — BP 90/66 | HR 72 | Ht 61.0 in | Wt 134.8 lb

## 2010-11-12 DIAGNOSIS — K219 Gastro-esophageal reflux disease without esophagitis: Secondary | ICD-10-CM

## 2010-11-12 DIAGNOSIS — K589 Irritable bowel syndrome without diarrhea: Secondary | ICD-10-CM

## 2010-11-12 NOTE — Patient Instructions (Signed)
Call back if your still have any more constipation and we will call in Amitiza for you to try.

## 2010-11-12 NOTE — Progress Notes (Signed)
This is a 70 year old African American female who has had previous endoscopy and esophageal dilatation. She currently is asymptomatic all Prilosec 40 mg a day. Her mild constipation and bloating also have improved with when necessary MiraLax.  Current Medications, Allergies, Past Medical History, Past Surgical History, Family History and Social History were reviewed in [employer] record.  Pertinent Review of Systems Negative   Physical Exam: Elderly appearing female in no acute distress appears stated age. Chest is clear cardiac exam is unremarkable. I cannot appreciate hepatosplenomegaly, abdominal masses, tenderness, or abdominal distention. Peripheral extremities are unremarkable. Mental status is normal.    Assessment and Plan: GERD doing well on PPI therapy and mild constipation also improved with when necessary MiraLax. I have added daily Metamucil to her regime with liberal by mouth fluids and will see her on when necessary basis as needed. However it should continue other medications listed and reviewed her chart. Previous gastric biopsies were negative for H. pylori infection. 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 She is followed by [facility] nurse practitioner.   Please copy her primary care physician, referring physician, and pertinent subspecialists. No diagnosis found.

## 2010-12-31 LAB — CBC
HCT: 32.3 — ABNORMAL LOW
HCT: 34.1 — ABNORMAL LOW
HCT: 35.3 — ABNORMAL LOW
Hemoglobin: 10.8 — ABNORMAL LOW
Hemoglobin: 11.4 — ABNORMAL LOW
MCHC: 33.5
MCHC: 33.5
MCHC: 33.5
MCV: 88.7
MCV: 88.9
Platelets: 155
Platelets: 174
RBC: 3.85 — ABNORMAL LOW
RDW: 14.1
RDW: 14.2

## 2010-12-31 LAB — BASIC METABOLIC PANEL
BUN: 22
CO2: 26
CO2: 26
Calcium: 9.1
Chloride: 103
Chloride: 105
Creatinine, Ser: 1.13
GFR calc Af Amer: 58 — ABNORMAL LOW
GFR calc Af Amer: >60
GFR calc non Af Amer: 48 — ABNORMAL LOW
Glucose, Bld: 103 — ABNORMAL HIGH
Glucose, Bld: 94
Potassium: 3.7
Sodium: 137
Sodium: 139

## 2010-12-31 LAB — CK TOTAL AND CKMB (NOT AT ARMC)
CK, MB: 3
CK, MB: 4.2 — ABNORMAL HIGH
Relative Index: 1.4
Relative Index: 1.5
Total CK: 176
Total CK: 209 — ABNORMAL HIGH
Total CK: 246 — ABNORMAL HIGH

## 2010-12-31 LAB — GLUCOSE, CAPILLARY
Glucose-Capillary: 101 — ABNORMAL HIGH
Glucose-Capillary: 116 — ABNORMAL HIGH
Glucose-Capillary: 147 — ABNORMAL HIGH

## 2010-12-31 LAB — HEMOGLOBIN A1C: Mean Plasma Glucose: 131

## 2010-12-31 LAB — B-NATRIURETIC PEPTIDE (CONVERTED LAB): Pro B Natriuretic peptide (BNP): <30

## 2010-12-31 LAB — DIFFERENTIAL
Basophils Relative: 1
Eosinophils Absolute: 0.3
Eosinophils Relative: 5
Lymphs Abs: 1.9
Monocytes Relative: 9
Neutrophils Relative %: 51

## 2010-12-31 LAB — POCT CARDIAC MARKERS: Myoglobin, poc: 135

## 2010-12-31 LAB — LIPID PANEL
Cholesterol: 160
HDL: 81

## 2010-12-31 LAB — TSH: TSH: 1.072

## 2010-12-31 LAB — TROPONIN I
Troponin I: 0.01
Troponin I: <0.01
Troponin I: <0.01

## 2011-03-21 ENCOUNTER — Emergency Department (HOSPITAL_COMMUNITY): Payer: Medicare Other

## 2011-03-21 ENCOUNTER — Ambulatory Visit: Payer: Medicare Other | Admitting: Cardiovascular Disease

## 2011-03-21 ENCOUNTER — Encounter (HOSPITAL_COMMUNITY): Payer: Self-pay | Admitting: *Deleted

## 2011-03-21 ENCOUNTER — Emergency Department (HOSPITAL_COMMUNITY)
Admission: EM | Admit: 2011-03-21 | Discharge: 2011-03-21 | Disposition: A | Discharge status: Home / Self Care | Payer: Medicare Other | Attending: Emergency Medicine | Admitting: Emergency Medicine

## 2011-03-21 DIAGNOSIS — I2510 Atherosclerotic heart disease of native coronary artery without angina pectoris: Secondary | ICD-10-CM | POA: Insufficient documentation

## 2011-03-21 DIAGNOSIS — E119 Type 2 diabetes mellitus without complications: Secondary | ICD-10-CM | POA: Insufficient documentation

## 2011-03-21 DIAGNOSIS — R062 Wheezing: Secondary | ICD-10-CM | POA: Insufficient documentation

## 2011-03-21 DIAGNOSIS — R059 Cough, unspecified: Secondary | ICD-10-CM | POA: Insufficient documentation

## 2011-03-21 DIAGNOSIS — IMO0001 Reserved for inherently not codable concepts without codable children: Secondary | ICD-10-CM | POA: Insufficient documentation

## 2011-03-21 DIAGNOSIS — J40 Bronchitis, not specified as acute or chronic: Secondary | ICD-10-CM | POA: Insufficient documentation

## 2011-03-21 DIAGNOSIS — R1110 Vomiting, unspecified: Secondary | ICD-10-CM | POA: Insufficient documentation

## 2011-03-21 DIAGNOSIS — Z79899 Other long term (current) drug therapy: Secondary | ICD-10-CM | POA: Insufficient documentation

## 2011-03-21 DIAGNOSIS — R05 Cough: Secondary | ICD-10-CM | POA: Insufficient documentation

## 2011-03-21 DIAGNOSIS — I10 Essential (primary) hypertension: Secondary | ICD-10-CM | POA: Insufficient documentation

## 2011-03-21 MED ORDER — DEXAMETHASONE SODIUM PHOSPHATE 10 MG/ML IJ SOLN
10.0000 mg | Freq: Once | INTRAMUSCULAR | Status: AC
  Administered 2011-03-21: 10 mg via INTRAMUSCULAR
  Filled 2011-03-21: qty 1

## 2011-03-21 MED ORDER — ALBUTEROL SULFATE HFA 108 (90 BASE) MCG/ACT IN AERS
2.0000 | INHALATION_SPRAY | Freq: Four times a day (QID) | RESPIRATORY_TRACT | Status: DC | PRN
  Administered 2011-03-21: 2 via RESPIRATORY_TRACT
  Filled 2011-03-21: qty 6.7

## 2011-03-21 MED ORDER — MOXIFLOXACIN HCL 400 MG PO TABS: 400.0000 mg | ORAL_TABLET | Freq: Every day | ORAL | Status: AC

## 2011-03-21 NOTE — ED Provider Notes (Signed)
History     CSN: [id number]  Arrival date & time 03/21/11  1456   First MD Initiated Contact with Patient 03/21/11 1737      Chief Complaint  Patient presents with  . URI    (Consider location/radiation/quality/duration/timing/severity/associated sxs/prior treatment) HPI Comments: Pt complains of cough for last month.  Saw her PMD on Dec 3rd, took z-pack.  Has not been back to see PMD.  Says cough got better for a few days, then got worse again.  Is coughing up green sputum.  No SOB.  No recent fevers.  Some post-tussive emesis, but no other vomiting.  No leg pain or swelling.  No chest pain other than soreness all over in muscles  Patient is a 70 y.o. female presenting with URI. The history is provided by the patient.  URI The primary symptoms include fatigue, ear pain, sore throat, cough, wheezing, vomiting and myalgias. Primary symptoms do not include fever, headaches, abdominal pain, nausea, arthralgias or rash.  The sore throat is not accompanied by trouble swallowing.  The myalgias are not associated with weakness.  The illness is not associated with chills, congestion or rhinorrhea.    Past Medical History  Diagnosis Date  . Hypertension   . Diabetes mellitus   . CAD (coronary artery disease)   . DM (diabetes mellitus)   . HLD (hyperlipidemia)   . Chest pain   . Heart burn   . Fatigue   . Constipation   . Contusion of arm   . Joint stiffness of hand   . Urinary incontinence   . Vitamin D deficiency   . Anemia   . Arthritis   . Cataract, diabetic   . Diabetic retinopathy   . Glaucoma   . Hiatal hernia   . Esophageal stricture     Past Surgical History  Procedure Date  . Coronary angioplasty with stent placement 10/04  . Cardiac catheterization 10/04    s/p stent LV 45%  . Adenosine cardillite 05/16/05    negative signif. myocardial ischemic  . Cesarean section 85, 87    ectopic pregnancy  . Cholecystectomy 1998  . Cataract extraction     bilateral  .  Partial hysterectomy     Family History  Problem Relation Age of Onset  . Heart disease Mother   . Heart attack Father   . Colon cancer Neg Hx   . Diabetes    . Crohn's disease Brother   . Crohn's disease Brother   . Crohn's disease Brother     History  Substance Use Topics  . Smoking status: Former Smoker    Quit date: 03/31/1997  . Smokeless tobacco: Never Used  . Alcohol Use: No     ocassional    OB History    Grav Para Term Preterm Abortions TAB SAB Ect Mult Living                  Review of Systems  Constitutional: Positive for fatigue. Negative for fever, chills and diaphoresis.  HENT: Positive for ear pain, sore throat and postnasal drip. Negative for congestion, rhinorrhea, sneezing and trouble swallowing.   Eyes: Negative.   Respiratory: Positive for cough and wheezing. Negative for chest tightness and shortness of breath.   Cardiovascular: Negative for chest pain and leg swelling.  Gastrointestinal: Positive for vomiting. Negative for nausea, abdominal pain, diarrhea and blood in stool.  Genitourinary: Negative for frequency, hematuria, flank pain and difficulty urinating.  Musculoskeletal: Positive for myalgias. Negative for back  pain and arthralgias.  Skin: Negative for rash.  Neurological: Negative for dizziness, speech difficulty, weakness, numbness and headaches.    Allergies  Milk-related compounds and Penicillins  Home Medications   Current Outpatient Rx  Name Route Sig Dispense Refill  . ASPIRIN 81 MG PO TBEC Oral Take 81 mg by mouth daily.      Marland Kitchen CALCIUM CARBONATE-VITAMIN D 600-400 MG-UNIT PO TABS Oral Take 1 tablet by mouth 2 (two) times daily.      Marland Kitchen LIDOCAINE 5 % EX PTCH Transdermal Place 1 patch onto the skin daily. Remove & Discard patch within 12 hours or as directed by MD     . LISINOPRIL-HYDROCHLOROTHIAZIDE 20-25 MG PO TABS Oral Take 1 tablet by mouth daily.      Marland Kitchen METFORMIN HCL 500 MG PO TABS Oral Take 500 mg by mouth daily with  breakfast.      . ONE-DAILY MULTI VITAMINS PO TABS Oral Take 1 tablet by mouth daily.      Marland Kitchen OMEPRAZOLE 40 MG PO CPDR Oral Take 1 capsule (40 mg total) by mouth daily. 30 capsule 11  . OXYBUTYNIN CHLORIDE 5 MG PO TABS Oral Take 5 mg by mouth 2 (two) times daily.      Marland Kitchen POLYETHYLENE GLYCOL 3350 PO POWD Oral Take 17 g by mouth daily as needed.      Marland Kitchen ROSUVASTATIN CALCIUM 10 MG PO TABS Oral Take 10 mg by mouth at bedtime.     . TRAMADOL HCL 50 MG PO TABS Oral Take 50 mg by mouth every 6 (six) hours as needed. Maximum dose= 8 tablets per day     . TRIAMCINOLONE ACETONIDE 0.1 % EX CREA Topical Apply 1 application topically as directed.      Marland Kitchen MOXIFLOXACIN HCL 400 MG PO TABS Oral Take 1 tablet (400 mg total) by mouth daily. 7 tablet 0  . NITROGLYCERIN 0.4 MG SL SUBL Sublingual Place 0.4 mg under the tongue every 5 (five) minutes as needed.        BP 149/92  Pulse 97  Temp(Src) 99 F (37.2 C) (Oral)  Resp 16  SpO2 100%  Physical Exam  Constitutional: She is oriented to person, place, and time. She appears well-developed and well-nourished.  HENT:  Head: Normocephalic and atraumatic.  Right Ear: External ear normal.  Left Ear: External ear normal.  Eyes: Pupils are equal, round, and reactive to light.  Neck: Normal range of motion. Neck supple.  Cardiovascular: Normal rate, regular rhythm and normal heart sounds.   Pulmonary/Chest: Effort normal. No respiratory distress. She has wheezes. She has no rales. She exhibits no tenderness.       Slight wheezes on end expiration  Abdominal: Soft. Bowel sounds are normal. There is no tenderness. There is no rebound and no guarding.  Musculoskeletal: Normal range of motion. She exhibits no edema.  Lymphadenopathy:    She has no cervical adenopathy.  Neurological: She is alert and oriented to person, place, and time.  Skin: Skin is warm and dry. No rash noted.  Psychiatric: She has a normal mood and affect.    ED Course  Procedures (including  critical care time)  Labs Reviewed - No data to display Dg Chest 2 View  03/21/2011  *RADIOLOGY REPORT*  Clinical Data: Cough without improvement  CHEST - 2 VIEW  Comparison: 08/16/2010  Findings: Heart and mediastinal contours are within normal limits. The lung fields appear clear with no signs of focal infiltrate or congestive failure.  No pleural  fluid or significant peribronchial cuffing is seen.  Bony structures demonstrate rotoscoliosis of the lumbar spine and are otherwise intact. Surgical clips are seen in the right upper quadrant post cholecystectomy  IMPRESSION: Clear lungs with no worrisome focal or acute abnormality noted.  Original Report Authenticated By: Ander Gaster, M.D.     1. Bronchitis       MDM  No evidence of pneumonia.  Nothing to suggest PE.  Will tx for bronchitis        Malvin Johns, MD 03/21/11 1902

## 2011-03-21 NOTE — ED Notes (Signed)
Pt in c/o cough, congestion, fever, n/v x1 month, states she went to PMD for same and was given antibiotics, has completed those with no improvement in symptoms

## 2011-04-25 ENCOUNTER — Ambulatory Visit: Payer: Medicare Other | Admitting: Cardiovascular Disease

## 2011-05-19 ENCOUNTER — Ambulatory Visit: Payer: Medicare Other | Admitting: Cardiovascular Disease

## 2011-07-16 ENCOUNTER — Other Ambulatory Visit: Payer: Self-pay | Admitting: Licensed Clinical Social Worker

## 2011-10-20 ENCOUNTER — Ambulatory Visit (INDEPENDENT_AMBULATORY_CARE_PROVIDER_SITE_OTHER): Payer: Medicare Other | Admitting: Cardiovascular Disease

## 2011-10-20 ENCOUNTER — Encounter: Payer: Self-pay | Admitting: Cardiovascular Disease

## 2011-10-20 VITALS — BP 156/95 | HR 89 | Ht 60.0 in | Wt 128.0 lb

## 2011-10-20 DIAGNOSIS — I2510 Atherosclerotic heart disease of native coronary artery without angina pectoris: Secondary | ICD-10-CM

## 2011-10-20 NOTE — Progress Notes (Signed)
HPI:  71 year old woman presenting for followup evaluation. The patient has coronary artery disease and history of MI and prior PCI in 2004. Last nuclear stress test in 2012 showed an ejection fraction of 59% with a small apical perfusion defect question ischemia versus shifting breast attenuation artifact. Overall the study was low risk. Her last visit was one year ago when she saw Richardson Dopp, at which time she complained of postural lightheadedness when in the extreme. Fluid hydration was recommended.  The patient presents today for followup evaluation. Unfortunately she lost her son a few months ago. He was apparently murdered and she remains quite upset about this. As would be expected, she's been having a very difficult time. She's had difficulty sleeping and has been forgetting to take her medications. Overall she feels that she slowly moving through the grieving process and getting better. She denies chest pain or dyspnea. She continues to have lightheaded spells but she relates this to low blood sugars. She's not been eating regularly. She's had trouble getting access to a good primary care physician. She has not been interested in taking antidepressant medication and she's been trying to find a support group without much success.  Outpatient Encounter Prescriptions as of 10/20/2011  Medication Sig Dispense Refill  . aspirin 81 MG EC tablet Take 81 mg by mouth daily.        . Calcium Carbonate-Vitamin D (CALTRATE 600+D) 600-400 MG-UNIT per tablet Take 1 tablet by mouth 2 (two) times daily.        Marland Kitchen lidocaine (LIDODERM) 5 % Place 1 patch onto the skin daily. Remove & Discard patch within 12 hours or as directed by MD       . lisinopril-hydrochlorothiazide (PRINZIDE,ZESTORETIC) 20-25 MG per tablet Take 1 tablet by mouth daily.        . metFORMIN (GLUCOPHAGE) 500 MG tablet Take 500 mg by mouth daily with breakfast.        . Multiple Vitamin (MULTIVITAMIN) tablet Take 1 tablet by mouth daily.         . nitroGLYCERIN (NITROSTAT) 0.4 MG SL tablet Place 0.4 mg under the tongue every 5 (five) minutes as needed.        Marland Kitchen oxybutynin (DITROPAN) 5 MG tablet Take 5 mg by mouth 2 (two) times daily.        . polyethylene glycol powder (MIRALAX) powder Take 17 g by mouth daily as needed.        . rosuvastatin (CRESTOR) 10 MG tablet Take 10 mg by mouth at bedtime.       . traMADol (ULTRAM) 50 MG tablet Take 50 mg by mouth every 6 (six) hours as needed. Maximum dose= 8 tablets per day       . triamcinolone (KENALOG) 0.1 % cream Apply 1 application topically as directed.        Marland Kitchen omeprazole (PRILOSEC) 40 MG capsule Take 1 capsule (40 mg total) by mouth daily.  30 capsule  11    Allergies  Allergen Reactions  . Milk-Related Compounds     REACTION: Stomach-ache  . Penicillins Hives and Palpitations    Foam at the mouth    Past Medical History  Diagnosis Date  . Hypertension   . Diabetes mellitus   . CAD (coronary artery disease)   . DM (diabetes mellitus)   . HLD (hyperlipidemia)   . Chest pain   . Heart burn   . Fatigue   . Constipation   . Contusion of arm   .  Joint stiffness of hand   . Urinary incontinence   . Vitamin d deficiency   . Anemia   . Arthritis   . Cataract, diabetic   . Diabetic retinopathy   . Glaucoma   . Hiatal hernia   . Esophageal stricture     ROS: Negative except as per HPI  BP 156/95  Pulse 89  Ht 5' (1.524 m)  Wt 58.06 kg (128 lb)  BMI 25.00 kg/m2  PHYSICAL EXAM: Pt is alert and oriented, NAD, tearful at times HEENT: normal Neck: JVP - normal, carotids 2+= without bruits Lungs: CTA bilaterally CV: RRR without murmur or gallop Abd: soft, NT, Positive BS, no hepatomegaly Ext: no C/C/E, distal pulses intact and equal Skin: warm/dry no rash  EKG:  Sinus rhythm 67 beats per minute, minimal voltage criteria for LVH maybe normal variant.  ASSESSMENT AND PLAN:

## 2011-10-20 NOTE — Assessment & Plan Note (Signed)
Blood pressure is elevated but it is clear she is not taking her medication regularly. We've asked her to resume lisinopril/hydrochlorothiazide.

## 2011-10-20 NOTE — Patient Instructions (Addendum)
Set up appointment with Dr.John or Dr.Jones at [place].  Prilosec OTC  42 pills is $20.00.  Your physician wants you to follow-up in 12 months You will receive a reminder letter in the mail two months in advance. If you don't receive a letter, please call our office to schedule the follow-up appointment.

## 2011-10-20 NOTE — Assessment & Plan Note (Signed)
Stable without anginal symptoms. Main issue is working through the grief process after the loss of her son. We reinforced the importance of taking her medicines regularly. We are going to try to get her referred to her primary care physician. From a cardiac perspective, she should remain on aspirin, lisinopril, hydrochlorothiazide, and Crestor.

## 2011-10-20 NOTE — Assessment & Plan Note (Signed)
Last lipids were reviewed from 2012 when her cholesterol is 165, HDL 76, and LDL 80. She should remain on Crestor.

## 2012-04-03 ENCOUNTER — Emergency Department (HOSPITAL_COMMUNITY): Payer: Medicare Other

## 2012-04-03 ENCOUNTER — Encounter (HOSPITAL_COMMUNITY): Payer: Self-pay | Admitting: Emergency Medicine

## 2012-04-03 ENCOUNTER — Emergency Department (HOSPITAL_COMMUNITY)
Admission: EM | Admit: 2012-04-03 | Discharge: 2012-04-03 | Disposition: A | Discharge status: Home / Self Care | Payer: Medicare Other | Attending: Emergency Medicine | Admitting: Emergency Medicine

## 2012-04-03 DIAGNOSIS — H409 Unspecified glaucoma: Secondary | ICD-10-CM | POA: Insufficient documentation

## 2012-04-03 DIAGNOSIS — Z79899 Other long term (current) drug therapy: Secondary | ICD-10-CM | POA: Insufficient documentation

## 2012-04-03 DIAGNOSIS — K1121 Acute sialoadenitis: Secondary | ICD-10-CM

## 2012-04-03 DIAGNOSIS — Z8719 Personal history of other diseases of the digestive system: Secondary | ICD-10-CM | POA: Insufficient documentation

## 2012-04-03 DIAGNOSIS — Z9861 Coronary angioplasty status: Secondary | ICD-10-CM | POA: Insufficient documentation

## 2012-04-03 DIAGNOSIS — R21 Rash and other nonspecific skin eruption: Secondary | ICD-10-CM | POA: Insufficient documentation

## 2012-04-03 DIAGNOSIS — Z9849 Cataract extraction status, unspecified eye: Secondary | ICD-10-CM | POA: Insufficient documentation

## 2012-04-03 DIAGNOSIS — Z7982 Long term (current) use of aspirin: Secondary | ICD-10-CM | POA: Insufficient documentation

## 2012-04-03 DIAGNOSIS — I10 Essential (primary) hypertension: Secondary | ICD-10-CM | POA: Insufficient documentation

## 2012-04-03 DIAGNOSIS — E1136 Type 2 diabetes mellitus with diabetic cataract: Secondary | ICD-10-CM | POA: Insufficient documentation

## 2012-04-03 DIAGNOSIS — E1139 Type 2 diabetes mellitus with other diabetic ophthalmic complication: Secondary | ICD-10-CM | POA: Insufficient documentation

## 2012-04-03 DIAGNOSIS — K1120 Sialoadenitis, unspecified: Secondary | ICD-10-CM | POA: Insufficient documentation

## 2012-04-03 DIAGNOSIS — E785 Hyperlipidemia, unspecified: Secondary | ICD-10-CM | POA: Insufficient documentation

## 2012-04-03 DIAGNOSIS — Z87448 Personal history of other diseases of urinary system: Secondary | ICD-10-CM | POA: Insufficient documentation

## 2012-04-03 DIAGNOSIS — I2510 Atherosclerotic heart disease of native coronary artery without angina pectoris: Secondary | ICD-10-CM | POA: Insufficient documentation

## 2012-04-03 DIAGNOSIS — Z87891 Personal history of nicotine dependence: Secondary | ICD-10-CM | POA: Insufficient documentation

## 2012-04-03 LAB — CBC WITH DIFFERENTIAL/PLATELET
Basophils Absolute: 0 10*3/uL (ref 0.0–0.1)
Basophils Relative: 0 % (ref 0–1)
Eosinophils Absolute: 0.1 10*3/uL (ref 0.0–0.7)
Eosinophils Relative: 1 % (ref 0–5)
MCH: 30 pg (ref 26.0–34.0)
MCV: 88.4 fL (ref 78.0–100.0)
Neutrophils Relative %: 76 % (ref 43–77)
Platelets: 161 10*3/uL (ref 150–400)
RDW: 13.7 % (ref 11.5–15.5)
WBC: 10.3 10*3/uL (ref 4.0–10.5)

## 2012-04-03 LAB — BASIC METABOLIC PANEL
Calcium: 10.6 mg/dL — ABNORMAL HIGH (ref 8.4–10.5)
GFR calc Af Amer: 59 mL/min — ABNORMAL LOW (ref >90)
GFR calc non Af Amer: 51 mL/min — ABNORMAL LOW (ref >90)
Sodium: 136 mEq/L (ref 135–145)

## 2012-04-03 MED ORDER — ONDANSETRON HCL 4 MG/2ML IJ SOLN
4.0000 mg | Freq: Once | INTRAMUSCULAR | Status: AC
  Administered 2012-04-03: 4 mg via INTRAVENOUS
  Filled 2012-04-03: qty 2

## 2012-04-03 MED ORDER — CEPHALEXIN 500 MG PO CAPS
500.0000 mg | ORAL_CAPSULE | Freq: Once | ORAL | Status: AC
  Administered 2012-04-03: 500 mg via ORAL
  Filled 2012-04-03: qty 1

## 2012-04-03 MED ORDER — MORPHINE SULFATE 4 MG/ML IJ SOLN
6.0000 mg | Freq: Once | INTRAMUSCULAR | Status: AC
  Administered 2012-04-03: 6 mg via INTRAVENOUS
  Filled 2012-04-03 (×2): qty 1

## 2012-04-03 MED ORDER — ONDANSETRON HCL 4 MG PO TABS: 4.0000 mg | ORAL_TABLET | Freq: Three times a day (TID) | ORAL | Status: DC | PRN

## 2012-04-03 MED ORDER — IOHEXOL 300 MG/ML  SOLN
100.0000 mL | Freq: Once | INTRAMUSCULAR | Status: AC | PRN
  Administered 2012-04-03: 100 mL via INTRAVENOUS

## 2012-04-03 MED ORDER — CEPHALEXIN 500 MG PO CAPS: 500.0000 mg | ORAL_CAPSULE | Freq: Four times a day (QID) | ORAL | Status: DC

## 2012-04-03 MED ORDER — OXYCODONE-ACETAMINOPHEN 5-325 MG PO TABS: ORAL_TABLET | ORAL | Status: DC

## 2012-04-03 NOTE — ED Notes (Signed)
Patient transported to CT 

## 2012-04-03 NOTE — ED Provider Notes (Signed)
History     CSN: [id number]  Arrival date & time 04/03/12  1151   First MD Initiated Contact with Patient 04/03/12 1219      Chief Complaint  Patient presents with  . Jaw Pain    (Consider location/radiation/quality/duration/timing/severity/associated sxs/prior treatment) HPI  Evelyn Morrison is a 72 y.o. female with non-insulin-dependent diabetes complaining of painful swelling to right jaw worsening x 48 hours. She also had a mild toothache x5days. denies fever, nausea/vomiting, shortness of breath. Pain is severe rated at 9/10 and exacerbated by palpation and movement. No prior episodes.   Past Medical History  Diagnosis Date  . Hypertension   . Diabetes mellitus   . CAD (coronary artery disease)   . DM (diabetes mellitus)   . HLD (hyperlipidemia)   . Chest pain   . Heart burn   . Fatigue   . Constipation   . Contusion of arm   . Joint stiffness of hand   . Urinary incontinence   . Vitamin D deficiency   . Anemia   . Arthritis   . Cataract, diabetic   . Diabetic retinopathy(362.0)   . Glaucoma(365)   . Hiatal hernia   . Esophageal stricture     Past Surgical History  Procedure Date  . Coronary angioplasty with stent placement 10/04  . Cardiac catheterization 10/04    s/p stent LV 45%  . Adenosine cardillite 05/16/05    negative signif. myocardial ischemic  . Cesarean section 84, 86    ectopic pregnancy  . Cholecystectomy 1998  . Cataract extraction     bilateral  . Partial hysterectomy     Family History  Problem Relation Age of Onset  . Heart disease Mother   . Heart attack Father   . Colon cancer Neg Hx   . Diabetes    . Crohn's disease Brother   . Crohn's disease Brother   . Crohn's disease Brother     History  Substance Use Topics  . Smoking status: Former Smoker    Quit date: 03/31/1997  . Smokeless tobacco: Never Used  . Alcohol Use: No     Comment: ocassional    OB History    Grav Para Term Preterm Abortions TAB SAB Ect Mult  Living                  Review of Systems  Constitutional: Negative for fever.  HENT: Positive for dental problem.   Respiratory: Negative for shortness of breath.   Cardiovascular: Negative for chest pain.  Gastrointestinal: Negative for nausea, vomiting, abdominal pain and diarrhea.  Skin: Positive for rash.  All other systems reviewed and are negative.    Allergies  Milk-related compounds and Penicillins  Home Medications   Current Outpatient Rx  Name  Route  Sig  Dispense  Refill  . ASPIRIN 81 MG PO TBEC   Oral   Take 81 mg by mouth daily.           Marland Kitchen CALCIUM CARBONATE-VITAMIN D 600-400 MG-UNIT PO TABS   Oral   Take 1 tablet by mouth 2 (two) times daily.           Marland Kitchen LISINOPRIL-HYDROCHLOROTHIAZIDE 20-25 MG PO TABS   Oral   Take 1 tablet by mouth daily.           Marland Kitchen METFORMIN HCL 500 MG PO TABS   Oral   Take 500 mg by mouth daily with breakfast.           .  ONE-DAILY MULTI VITAMINS PO TABS   Oral   Take 1 tablet by mouth daily.           Marland Kitchen NITROGLYCERIN 0.4 MG SL SUBL   Sublingual   Place 0.4 mg under the tongue every 5 (five) minutes as needed. Chest pain         . OXYBUTYNIN CHLORIDE 5 MG PO TABS   Oral   Take 5 mg by mouth 2 (two) times daily.           Marland Kitchen POLYETHYLENE GLYCOL 3350 PO POWD   Oral   Take 17 g by mouth daily as needed. Constipation         . ROSUVASTATIN CALCIUM 10 MG PO TABS   Oral   Take 10 mg by mouth at bedtime.          . TRAMADOL HCL 50 MG PO TABS   Oral   Take 50 mg by mouth every 6 (six) hours as needed. Pain Maximum dose= 8 tablets per day         . TRIAMCINOLONE ACETONIDE 0.1 % EX CREA   Topical   Apply 1 application topically 2 (two) times daily.          Marland Kitchen OMEPRAZOLE 40 MG PO CPDR   Oral   Take 1 capsule (40 mg total) by mouth daily.   30 capsule   11     BP 127/85  Pulse 88  Temp 98.7 F (37.1 C) (Oral)  Resp 18  SpO2 100%  Physical Exam  Nursing note and vitals  reviewed. Constitutional: She is oriented to person, place, and time. She appears well-developed and well-nourished. No distress.  HENT:  Head: Normocephalic.    Right Ear: External ear normal.  Left Ear: External ear normal.  Mouth/Throat: Oropharynx is clear and moist.       Bilateral tympanic membranes are normal with good architecture and light reflex.   Airway widely patent, no gross dental abnormalities, no tenderness to palpation of mucosa.     Eyes: Conjunctivae normal and EOM are normal.  Cardiovascular: Normal rate.   Pulmonary/Chest: Effort normal. No stridor.  Musculoskeletal: Normal range of motion.  Neurological: She is alert and oriented to person, place, and time.  Psychiatric: She has a normal mood and affect.    ED Course  Procedures (including critical care time)  Labs Reviewed  CBC WITH DIFFERENTIAL - Abnormal; Notable for the following:    Neutro Abs 7.8 (*)     Lymphocytes Relative 11 (*)     Monocytes Absolute 1.2 (*)     All other components within normal limits  BASIC METABOLIC PANEL - Abnormal; Notable for the following:    Glucose, Bld 129 (*)     Calcium 10.6 (*)     GFR calc non Af Amer 51 (*)     GFR calc Af Amer 59 (*)     All other components within normal limits   Ct Soft Tissue Neck W Contrast  04/03/2012  *RADIOLOGY REPORT*  Clinical Data: Right jaw pain and swelling  CT NECK WITH CONTRAST  Technique:  Multidetector CT imaging of the neck was performed with intravenous contrast.  Contrast: 192mL OMNIPAQUE IOHEXOL 300 MG/ML  SOLN  Comparison: None.  Findings: Right parotid gland is enlarged and shows increased blood flow and enhancement diffusely.  Findings are most compatible with sialoadenitis.  No abscess is seen.  No salivary duct stone is present.  The left parotid gland is normal.  Submandibular glands are normal bilaterally.  Mild adenopathy in the right neck.  Right level II nodes measure 8 mm and 7 mm.  Small posterior lymph nodes are  present on the right. No pathologic adenopathy.  The epiglottis and larynx are normal.  Multiple small thyroid nodules are present suggestive of multinodular goiter.  Cervical disc degeneration and spondylosis, most prominent at C5-6.  Lung apices are clear.  IMPRESSION: Findings are compatible with parotitis on the right without abscess.  Mild adenopathy in the right neck most likely reactive.   Original Report Authenticated By: Carl Best, M.D.      1. Parotitis, acute       MDM  Patient has no other lymphadenopathy. She has no leukocytosis.  CT shows sialoadenitis without abscess. No duct stone is present.  Explained to patient the findings are not consistent with malignancy. Expended this is likely viral however I will prescribe her an antibiotic to cover for bacterial infection because she is advanced age and with diabetes. Patient and her daughters agreed with care plan and are amenable to discharge.  Discussed case with attending who agrees with plan and stability to d/c to home.    Pt verbalized understanding and agrees with care plan. Outpatient follow-up and return precautions given.    New Prescriptions   CEPHALEXIN (KEFLEX) 500 MG CAPSULE    Take 1 capsule (500 mg total) by mouth 4 (four) times daily.   ONDANSETRON (ZOFRAN) 4 MG TABLET    Take 1 tablet (4 mg total) by mouth every 8 (eight) hours as needed for nausea.   OXYCODONE-ACETAMINOPHEN (PERCOCET/ROXICET) 5-325 MG PER TABLET    1 to 2 tabs PO q6hrs  PRN for pain        Monico Blitz, PA-C 04/03/12 1639

## 2012-04-03 NOTE — ED Notes (Signed)
Pt c/o right lower dental pain x 5 days, now having swelling and pain in right jaw.

## 2012-04-04 NOTE — ED Provider Notes (Signed)
Medical screening examination/treatment/procedure(s) were performed by non-physician practitioner and as supervising physician I was immediately available for consultation/collaboration.   Saddie Benders. Doneen Ollinger, MD 04/04/12 2030

## 2012-04-22 ENCOUNTER — Ambulatory Visit: Payer: Medicare Other | Admitting: Internal Medicine

## 2012-06-13 IMAGING — CT CT ABD-PELV W/O CM
1 of 2 series · 13 of 32 positions shown, 19 images · non-contrast
Comparison: None.

CLINICAL DATA: Left flank pain.  Prior appendectomy and
cholecystectomy.

CT ABDOMEN AND PELVIS WITHOUT CONTRAST
TECHNIQUE: Multidetector CT imaging of the abdomen and pelvis was
performed following the standard protocol without intravenous
contrast.

[Series 2: under 200# stone no prev · axial · 0.60mm/px · z∈[-508,-174]mm · 13 of 79 slices shown, 19 images]
[im 6/79  soft-tissue]
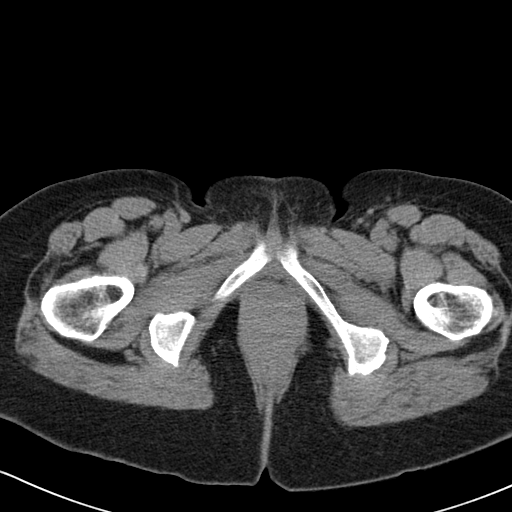
[im 6/79  bone]
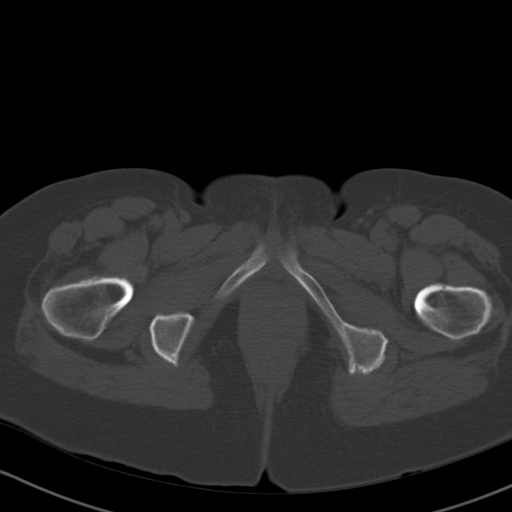
[im 11/79  soft-tissue]
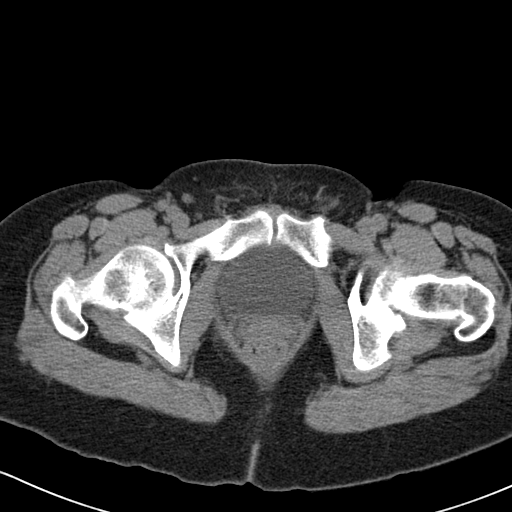
[im 16/79  soft-tissue]
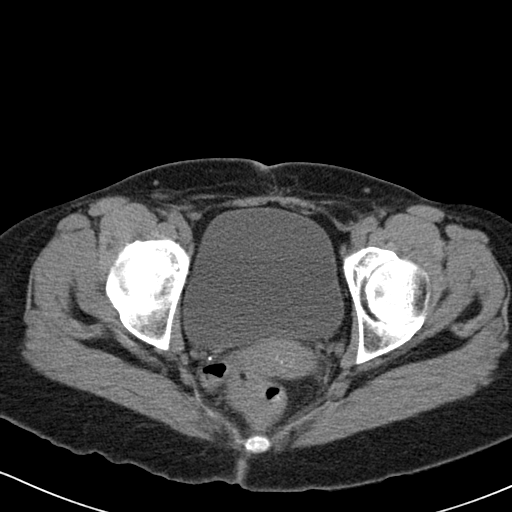
[im 21/79  soft-tissue]
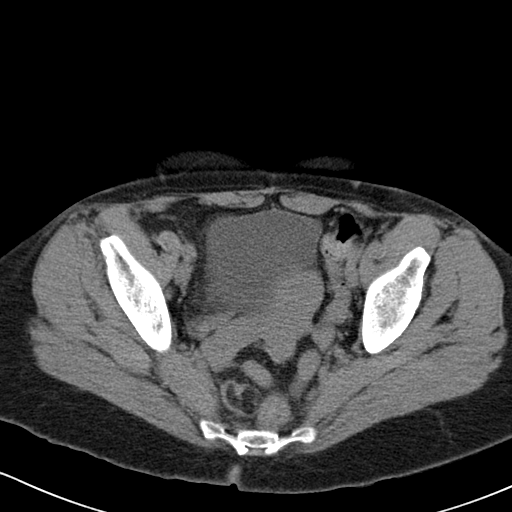
[im 27/79  soft-tissue]
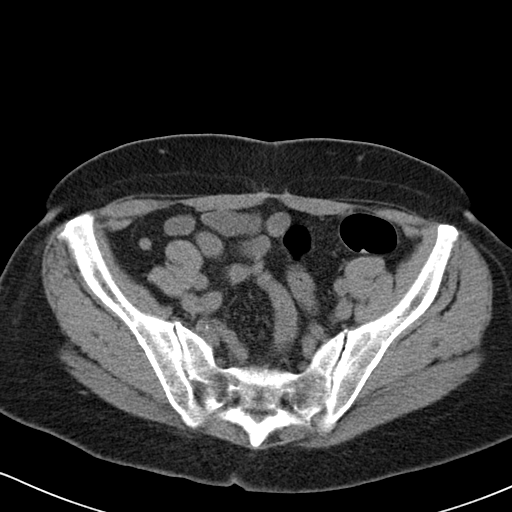
[im 32/79  soft-tissue]
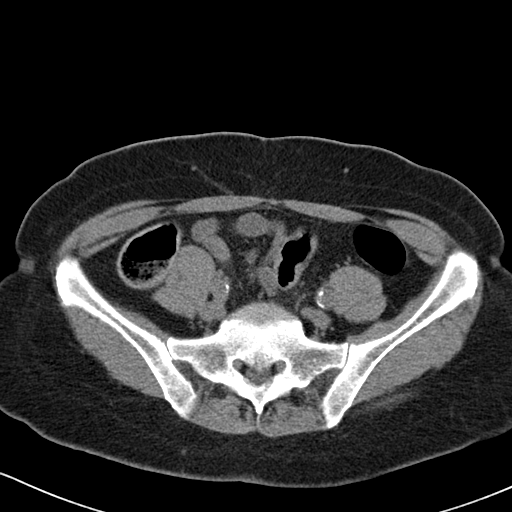
[im 42/79  soft-tissue]
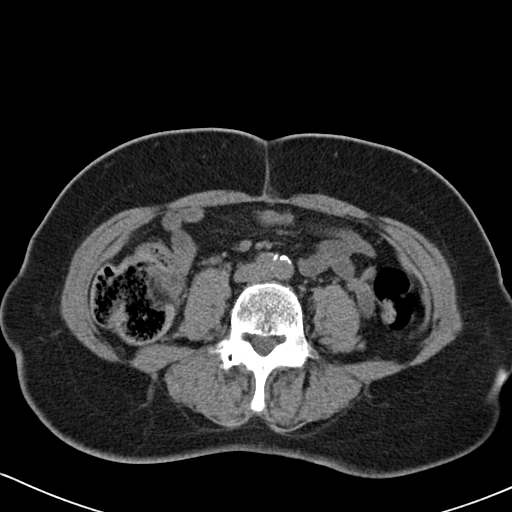
[im 47/79  soft-tissue]
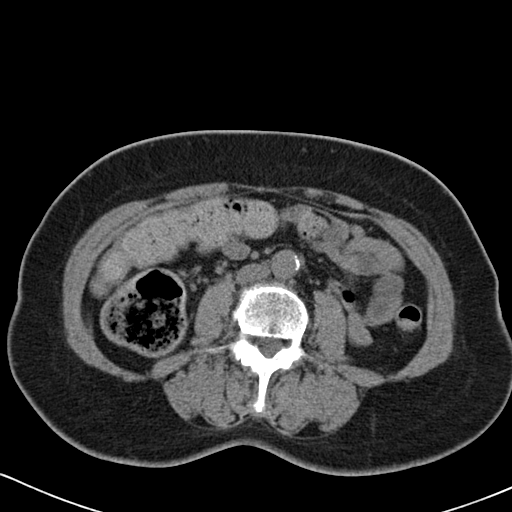
[im 53/79  soft-tissue]
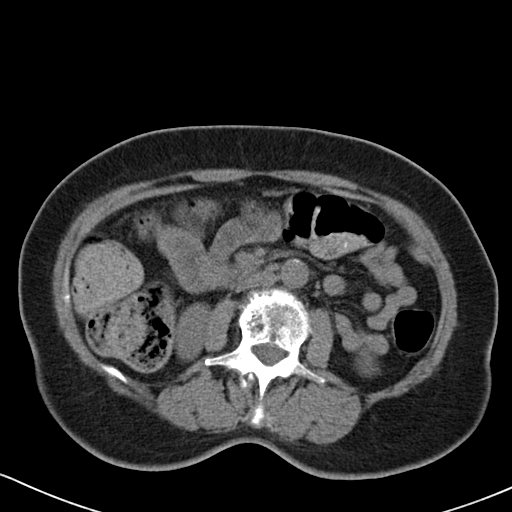
[im 53/79  bone]
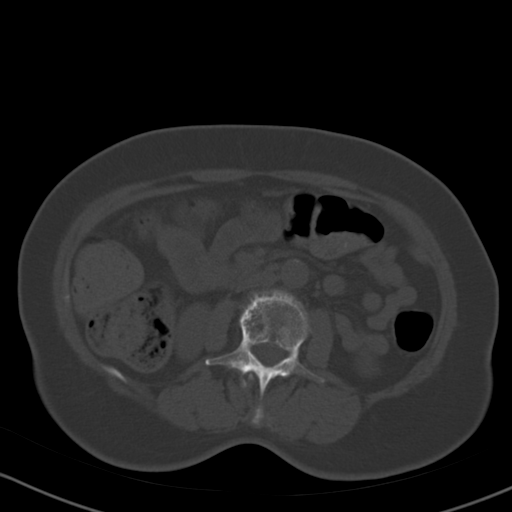
[im 58/79  soft-tissue]
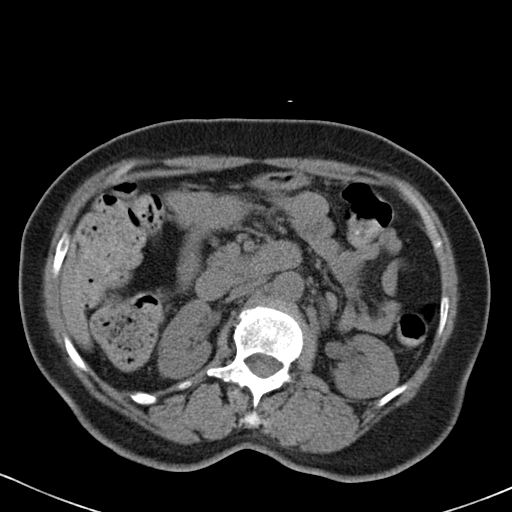
[im 58/79  lung]
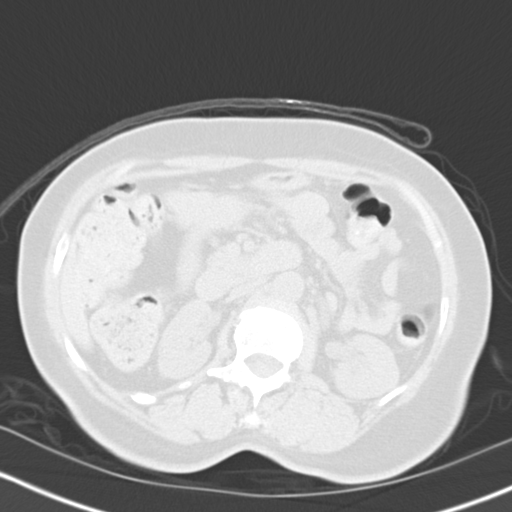
[im 63/79  soft-tissue]
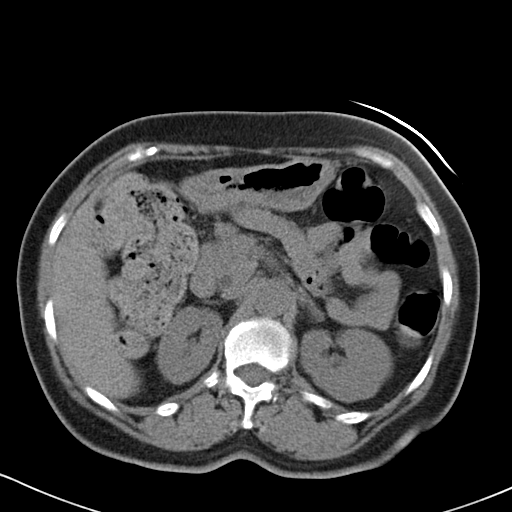
[im 63/79  lung]
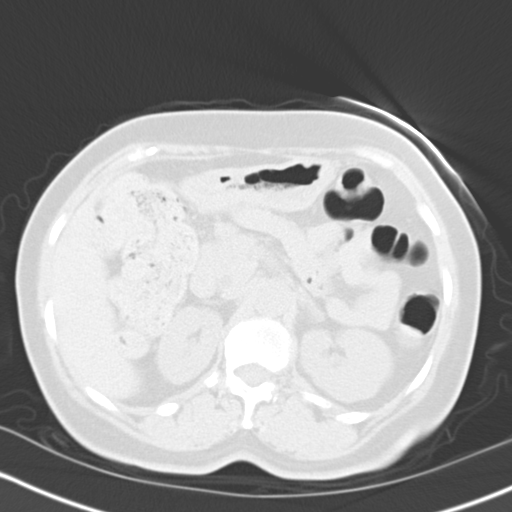
[im 68/79  soft-tissue]
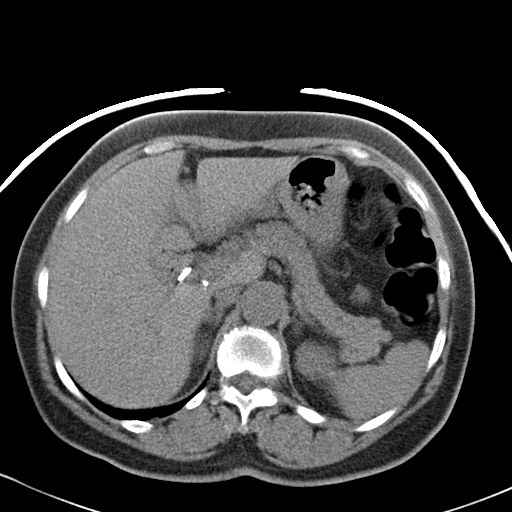
[im 68/79  lung]
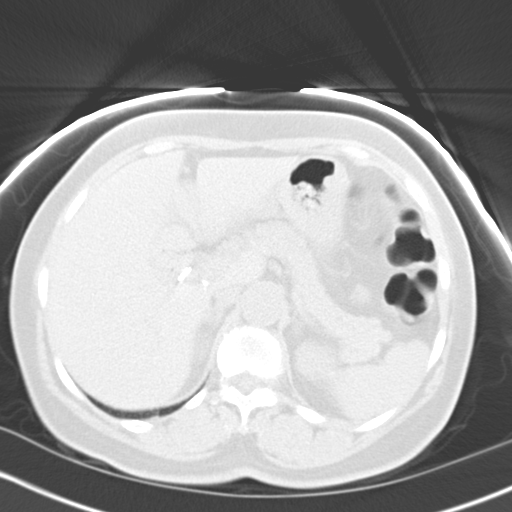
[im 73/79  soft-tissue]
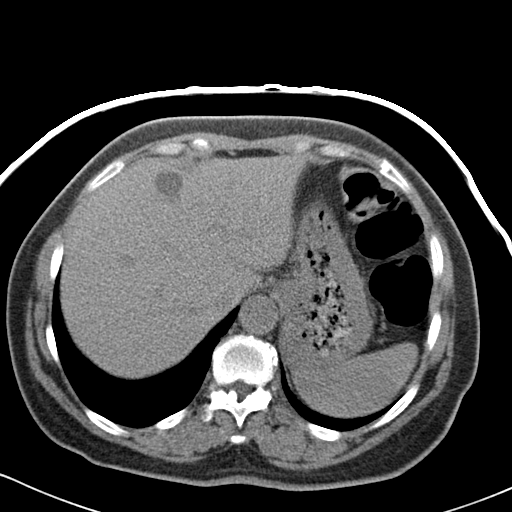
[im 73/79  lung]
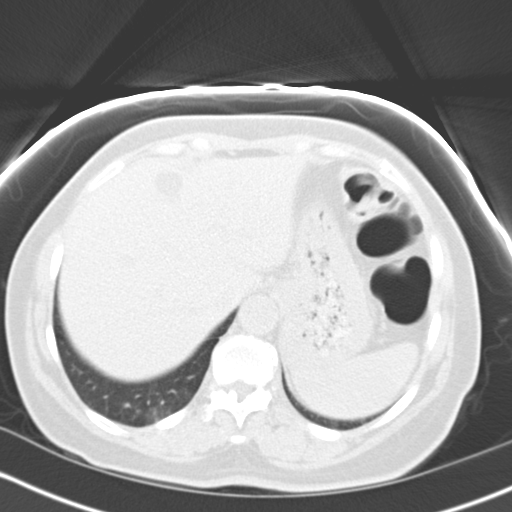

[13 of 32 positions shown; findings below may reference images not displayed]

FINDINGS: 2.4 cm low density lesion in the anterior liver measures
water density and is probably a cyst.  Visualized portions of the
liver and spleen otherwise have normal uninfused features.  The
stomach, duodenum, pancreas, and right adrenal gland are normal.
1.5 cm left adrenal nodule measures less than 10 HU in attenuation,
consistent with benign adrenal adenoma.

2 mm nonobstructing stone is seen in the lower pole of the right
kidney.  11 mm water density lesion in the lower pole of the right
kidney is probably a cyst.  No evidence for stone in the left
kidney.  No ureteral or bladder stone.

No abdominal aortic aneurysm.  Several small para-aortic lymph
nodes are identified, but there is no lymphadenopathy in the
abdomen.  No free fluid in the abdomen.

Imaging through the pelvis shows no free fluid.  The bladder is
distended.  No pelvic sidewall lymphadenopathy.  A left groin
hernia contains only fat.

2.3 x 1.5 cm fatty lesion is seen in the posterior pelvis which
appears to be associated with a small bowel loop.  A small bowel
lipoma would be a consideration. There is no adnexal mass in either
ovary can be definitely identified.  While it is possible fatty
lesion could be related to ovarian dermoid, it does appear be
associated with a small bowel loop.  The uterus is normal.

There is no substantial diverticular change in the left colon.  No
colonic diverticulitis.  Prominent stool is seen in the right
colon.  The terminal ileum is normal.  Although the patient reports
a history of appendectomy, a normal appendix is identified.

Bone windows reveal no worrisome lytic or sclerotic osseous
lesions.
IMPRESSION: No acute findings in the abdomen or pelvis.  No CT evidence to
explain the patient's history of left flank pain.

Incidental findings as outlined above.

## 2012-08-17 ENCOUNTER — Other Ambulatory Visit (HOSPITAL_COMMUNITY): Payer: Self-pay | Admitting: Internal Medicine

## 2012-08-17 ENCOUNTER — Other Ambulatory Visit (HOSPITAL_COMMUNITY): Payer: Medicare Other

## 2012-08-17 ENCOUNTER — Ambulatory Visit (HOSPITAL_COMMUNITY)
Admission: RE | Admit: 2012-08-17 | Discharge: 2012-08-17 | Disposition: A | Discharge status: Home / Self Care | Payer: Medicare Other | Source: Ambulatory Visit | Attending: Internal Medicine | Admitting: Internal Medicine

## 2012-08-17 DIAGNOSIS — M25559 Pain in unspecified hip: Secondary | ICD-10-CM | POA: Insufficient documentation

## 2012-08-17 DIAGNOSIS — R102 Pelvic and perineal pain: Secondary | ICD-10-CM

## 2012-10-25 ENCOUNTER — Ambulatory Visit: Payer: Medicare Other | Admitting: Cardiovascular Disease

## 2012-10-26 ENCOUNTER — Ambulatory Visit: Payer: Medicare Other | Admitting: Cardiovascular Disease

## 2012-12-29 ENCOUNTER — Ambulatory Visit (INDEPENDENT_AMBULATORY_CARE_PROVIDER_SITE_OTHER): Payer: Medicare Other | Admitting: Cardiovascular Disease

## 2012-12-29 ENCOUNTER — Encounter: Payer: Self-pay | Admitting: Cardiovascular Disease

## 2012-12-29 VITALS — BP 120/78 | HR 75 | Wt 128.0 lb

## 2012-12-29 DIAGNOSIS — I2510 Atherosclerotic heart disease of native coronary artery without angina pectoris: Secondary | ICD-10-CM

## 2012-12-29 NOTE — Patient Instructions (Addendum)
Your physician wants you to follow-up in: 1 YEAR with Dr Cooper.  You will receive a reminder letter in the mail two months in advance. If you don't receive a letter, please call our office to schedule the follow-up appointment.  Your physician recommends that you continue on your current medications as directed. Please refer to the Current Medication list given to you today.  

## 2012-12-29 NOTE — Progress Notes (Signed)
HPI:  72 year old woman presenting for followup evaluation. The patient has coronary artery disease and history of MI and prior PCI in 2004. Last nuclear stress test in 2012 showed an ejection fraction of 59% with a small apical perfusion defect question ischemia versus shifting breast attenuation artifact. Overall the study was low risk and medical therapy was recommended. Last lipids were drawn in 2012 with a cholesterol of 165, HDL 76, and LDL 80. A renal panel was drawn in January 2014 with a potassium of 4.1 and a creatinine of 1.08 mg/dL.  The patient has occasional chest pain at rest. The pattern is unchanged. She has no exertional chest pain or shortness of breath. She denies edema or palpitations. She has been compliant with her blood pressure medicines. She has recently started an antidepressant and feels that this has helped her quite a bit. She is experiencing some fatigue as a side effect.   Outpatient Encounter Prescriptions as of 12/29/2012  Medication Sig Dispense Refill  . aspirin 81 MG EC tablet Take 81 mg by mouth daily.        . Calcium Carbonate-Vitamin D (CALTRATE 600+D) 600-400 MG-UNIT per tablet Take 1 tablet by mouth 2 (two) times daily.        Marland Kitchen lisinopril-hydrochlorothiazide (PRINZIDE,ZESTORETIC) 20-25 MG per tablet Take 1 tablet by mouth daily.        . metFORMIN (GLUCOPHAGE) 500 MG tablet Take 500 mg by mouth daily with breakfast.        . Multiple Vitamin (MULTIVITAMIN) tablet Take 1 tablet by mouth daily.        . naproxen (NAPROSYN) 500 MG tablet Take 500 mg by mouth 2 (two) times daily with a meal.      . nitroGLYCERIN (NITROSTAT) 0.4 MG SL tablet Place 0.4 mg under the tongue every 5 (five) minutes as needed. Chest pain      . oxybutynin (DITROPAN) 5 MG tablet Take 5 mg by mouth 2 (two) times daily.        . polyethylene glycol powder (MIRALAX) powder Take 17 g by mouth daily as needed. Constipation      . rosuvastatin (CRESTOR) 10 MG tablet Take 10 mg by mouth  at bedtime.       . triamcinolone (KENALOG) 0.1 % cream Apply 1 application topically 2 (two) times daily.       . [DISCONTINUED] cephALEXin (KEFLEX) 500 MG capsule Take 1 capsule (500 mg total) by mouth 4 (four) times daily.  28 capsule  0  . [DISCONTINUED] omeprazole (PRILOSEC) 40 MG capsule Take 1 capsule (40 mg total) by mouth daily.  30 capsule  11  . [DISCONTINUED] ondansetron (ZOFRAN) 4 MG tablet Take 1 tablet (4 mg total) by mouth every 8 (eight) hours as needed for nausea.  10 tablet  0  . [DISCONTINUED] oxyCODONE-acetaminophen (PERCOCET/ROXICET) 5-325 MG per tablet 1 to 2 tabs PO q6hrs  PRN for pain  15 tablet  0  . [DISCONTINUED] traMADol (ULTRAM) 50 MG tablet Take 50 mg by mouth every 6 (six) hours as needed. Pain Maximum dose= 8 tablets per day       No facility-administered encounter medications on file as of 12/29/2012.    Allergies  Allergen Reactions  . Milk-Related Compounds     REACTION: Stomach-ache  . Penicillins Hives and Palpitations    Foam at the mouth    Past Medical History  Diagnosis Date  . Hypertension   . Diabetes mellitus   . CAD (coronary artery  disease)   . DM (diabetes mellitus)   . HLD (hyperlipidemia)   . Chest pain   . Heart burn   . Fatigue   . Constipation   . Contusion of arm   . Joint stiffness of hand   . Urinary incontinence   . Vitamin D deficiency   . Anemia   . Arthritis   . Cataract, diabetic   . Diabetic retinopathy   . Glaucoma   . Hiatal hernia   . Esophageal stricture     ROS: Negative except as per HPI  BP 120/78  Pulse 75  Wt 58.06 kg (128 lb)  BMI 25 kg/m2  PHYSICAL EXAM: Pt is alert and oriented, NAD HEENT: normal Neck: JVP - normal, carotids 2+= without bruits Lungs: CTA bilaterally CV: RRR without murmur or gallop Abd: soft, NT, Positive BS, no hepatomegaly Ext: no C/C/E, distal pulses intact and equal Skin: warm/dry no rash  EKG:  Normal sinus rhythm 75 beats per minute, voltage criteria for LVH,  otherwise within normal limits.  ASSESSMENT AND PLAN: 1. Coronary atherosclerosis, native vessel. The patient is stable with rare and atypical symptoms of chest pain. There is no exertional angina present. Continued medical therapy is indicated. The patient is on aspirin and a statin drug. She is also on an ACE inhibitor in the setting of her type 2 diabetes and known CAD.  2. Hypertension. Blood pressure is much better controlled now that she is taking lisinopril/hydrochlorothiazide regularly.  For followup I will see her back in one year.  Sherren Mocha 12/29/2012 1:20 PM

## 2014-02-07 ENCOUNTER — Ambulatory Visit: Payer: Medicare Other | Admitting: Cardiovascular Disease

## 2014-02-13 ENCOUNTER — Encounter: Payer: Self-pay | Admitting: Cardiovascular Disease

## 2014-02-13 ENCOUNTER — Ambulatory Visit (INDEPENDENT_AMBULATORY_CARE_PROVIDER_SITE_OTHER): Payer: Medicare HMO | Admitting: Cardiovascular Disease

## 2014-02-13 VITALS — BP 110/78 | HR 66 | Ht 60.0 in | Wt 138.0 lb

## 2014-02-13 DIAGNOSIS — R0602 Shortness of breath: Secondary | ICD-10-CM

## 2014-02-13 DIAGNOSIS — I2510 Atherosclerotic heart disease of native coronary artery without angina pectoris: Secondary | ICD-10-CM

## 2014-02-13 NOTE — Progress Notes (Signed)
Background: the patient is followed for coronary artery disease with history of MI in 2004. She was treated with PCI at that time. She had a nuclear stress test in 2012 showing an ejection fraction of 59% with a small apical perfusion defect question ischemia versus shifting breast attenuation artifact. The study was interpreted as "low risk." Medical therapy was recommended.  HPI:  73 year-old woman presenting for follow-up evaluation. She was last seen in October 2014. The patient complains of exertional dyspnea, worse over the past 6 months. She otherwise has no specific cardiac complaints. No chest pain, chest pressure, abdominal pain, cough, fever, chills, or leg swelling. She denies orthopnea or PND. Symptoms occur with one flight of stairs or walking short distances at a brisk pace.    Outpatient Encounter Prescriptions as of 02/13/2014  Medication Sig  . ALPRAZolam (XANAX) 0.5 MG tablet   . aspirin 81 MG EC tablet Take 81 mg by mouth daily.    . Calcium Carbonate-Vitamin D (CALTRATE 600+D) 600-400 MG-UNIT per tablet Take 1 tablet by mouth 2 (two) times daily.    Marland Kitchen FLUZONE HIGH-DOSE 0.5 ML SUSY   . lisinopril-hydrochlorothiazide (PRINZIDE,ZESTORETIC) 20-25 MG per tablet Take 1 tablet by mouth daily.    Marland Kitchen lovastatin (MEVACOR) 10 MG tablet   . metFORMIN (GLUCOPHAGE) 500 MG tablet Take 500 mg by mouth daily with breakfast.    . Multiple Vitamin (MULTIVITAMIN) tablet Take 1 tablet by mouth daily.    . nitroGLYCERIN (NITROSTAT) 0.4 MG SL tablet Place 0.4 mg under the tongue every 5 (five) minutes as needed. Chest pain  . oxybutynin (DITROPAN) 5 MG tablet Take 5 mg by mouth 2 (two) times daily.    . polyethylene glycol powder (MIRALAX) powder Take 17 g by mouth daily as needed. Constipation  . rosuvastatin (CRESTOR) 10 MG tablet Take 10 mg by mouth at bedtime.   . triamcinolone (KENALOG) 0.1 % cream Apply 1 application topically 2 (two) times daily.   . [DISCONTINUED] amLODipine (NORVASC) 10  MG tablet   . [DISCONTINUED] naproxen (NAPROSYN) 500 MG tablet Take 500 mg by mouth 2 (two) times daily with a meal.    Allergies  Allergen Reactions  . Milk-Related Compounds     REACTION: Stomach-ache  . Penicillins Hives and Palpitations    Foam at the mouth    Past Medical History  Diagnosis Date  . Hypertension   . Diabetes mellitus   . CAD (coronary artery disease)   . DM (diabetes mellitus)   . HLD (hyperlipidemia)   . Chest pain   . Heart burn   . Fatigue   . Constipation   . Contusion of arm   . Joint stiffness of hand   . Urinary incontinence   . Vitamin D deficiency   . Anemia   . Arthritis   . Cataract, diabetic   . Diabetic retinopathy   . Glaucoma   . Hiatal hernia   . Esophageal stricture     family history includes Crohn's disease in her brother, brother, and brother; Diabetes in an other family member; Heart attack in her father; Heart disease in her mother. There is no history of Colon cancer.   ROS: Negative except as per HPI  BP 110/78 mmHg  Pulse 66  Ht 5' (1.524 m)  Wt 138 lb (62.596 kg)  BMI 26.95 kg/m2  SpO2 97%  PHYSICAL EXAM: Pt is alert and oriented, NAD HEENT: normal Neck: JVP - normal, carotids 2+= without bruits Lungs: CTA bilaterally  CV: RRR without murmur or gallop Abd: soft, NT, Positive BS, no hepatomegaly Ext: no C/C/E, distal pulses intact and equal Skin: warm/dry no rash  EKG:  NSR 66 bpm, voltage criteria for LVH may be normal variant  ASSESSMENT AND PLAN: 1. Exertional dyspnea: no clear etiology. With her history of old MI/CAD, would favor stress echo to evaluate for resting LV dysfunction or ischemic changes with exercise (dyspnea as an anginal equivalent).   2. CAD, native vessel: as above. On ASA, ACE, and statin drug.   3. Hyperlipidemia: followed by PCP. Reviewed med list and lovastatin/rosuvastatin both listed. Will contact her to make sure she's not taking 2 different statin drugs.   Sherren Mocha,  MD 02/13/2014 10:09 AM

## 2014-02-13 NOTE — Patient Instructions (Signed)
Your physician has requested that you have a stress echocardiogram. For further information please visit [email or web address]. Please follow instruction sheet as given.  Your physician recommends that you continue on your current medications as directed. Please refer to the Current Medication list given to you today.  Your physician wants you to follow-up in: 1 YEAR with Dr Burt Knack.  You will receive a reminder letter in the mail two months in advance. If you don't receive a letter, please call our office to schedule the follow-up appointment.

## 2014-02-28 ENCOUNTER — Ambulatory Visit (HOSPITAL_COMMUNITY): Payer: Medicare HMO | Attending: Cardiovascular Disease | Admitting: Cardiology

## 2014-02-28 ENCOUNTER — Ambulatory Visit (HOSPITAL_BASED_OUTPATIENT_CLINIC_OR_DEPARTMENT_OTHER): Payer: Medicare HMO

## 2014-02-28 DIAGNOSIS — R0602 Shortness of breath: Secondary | ICD-10-CM | POA: Diagnosis not present

## 2014-02-28 DIAGNOSIS — I2510 Atherosclerotic heart disease of native coronary artery without angina pectoris: Secondary | ICD-10-CM | POA: Diagnosis not present

## 2014-02-28 DIAGNOSIS — R0989 Other specified symptoms and signs involving the circulatory and respiratory systems: Secondary | ICD-10-CM

## 2014-02-28 NOTE — Progress Notes (Signed)
Stress echo performed.

## 2014-05-26 ENCOUNTER — Other Ambulatory Visit: Payer: Self-pay

## 2014-05-26 DIAGNOSIS — R5381 Other malaise: Secondary | ICD-10-CM

## 2014-06-13 ENCOUNTER — Ambulatory Visit
Admission: RE | Admit: 2014-06-13 | Discharge: 2014-06-13 | Disposition: A | Discharge status: Home / Self Care | Payer: Medicare HMO | Source: Ambulatory Visit

## 2014-06-13 DIAGNOSIS — R5381 Other malaise: Secondary | ICD-10-CM

## 2015-02-13 ENCOUNTER — Other Ambulatory Visit: Payer: Self-pay | Admitting: Cardiovascular Disease

## 2015-02-13 ENCOUNTER — Ambulatory Visit (INDEPENDENT_AMBULATORY_CARE_PROVIDER_SITE_OTHER): Payer: Medicare HMO | Admitting: Cardiovascular Disease

## 2015-02-13 ENCOUNTER — Encounter: Payer: Self-pay | Admitting: Cardiovascular Disease

## 2015-02-13 VITALS — BP 120/62 | HR 66 | Ht 61.0 in | Wt 139.4 lb

## 2015-02-13 DIAGNOSIS — I10 Essential (primary) hypertension: Secondary | ICD-10-CM

## 2015-02-13 DIAGNOSIS — I2510 Atherosclerotic heart disease of native coronary artery without angina pectoris: Secondary | ICD-10-CM | POA: Diagnosis not present

## 2015-02-13 DIAGNOSIS — M79606 Pain in leg, unspecified: Secondary | ICD-10-CM

## 2015-02-13 DIAGNOSIS — I739 Peripheral vascular disease, unspecified: Secondary | ICD-10-CM

## 2015-02-13 NOTE — Patient Instructions (Signed)
Medication Instructions:  Your physician recommends that you continue on your current medications as directed. Please refer to the Current Medication list given to you today.  Labwork: No new orders.   Testing/Procedures: Your physician has requested that you have a lower extremity arterial duplex with ABI. This test is an ultrasound of the arteries in the legs. It looks at arterial blood flow in the legs. Allow one hour for Lower Arterial scans. There are no restrictions or special instructions  Follow-Up: Your physician wants you to follow-up in: 1 YEAR with Dr Burt Knack.  You will receive a reminder letter in the mail two months in advance. If you don't receive a letter, please call our office to schedule the follow-up appointment.   Any Other Special Instructions Will Be Listed Below (If Applicable).     If you need a refill on your cardiac medications before your next appointment, please call your pharmacy.

## 2015-02-13 NOTE — Progress Notes (Signed)
Cardiology Office Note Date:  02/13/2015   ID:  Evelyn Morrison, DOB 1940/08/08, MRN [id number]  PCP:  Kevan Ny, MD  Cardiologist:  Sherren Mocha, MD    Chief Complaint  Patient presents with  . Follow-up    no refills     History of Present Illness: Evelyn Morrison is a 74 y.o. female who presents for follow-up of coronary artery disease with history of MI in 2004. She was treated with PCI at that time. She had a nuclear stress test in 2012 showing an ejection fraction of 59% with a small apical perfusion defect question ischemia versus shifting breast attenuation artifact. The study was interpreted as "low risk." Medical therapy was recommended. A stress echocardiogram was done one year ago to evaluate shortness of breath. This showed normal LV function at rest with no ischemic changes during exertion.  The patient was last seen one year ago. The patient complains of gait unsteadiness. States 'I can't stand up for very long.' She cares for her husband who is bedbound. She denies postural symptoms and specifically denies lightheadedness or syncope when she first stands up. She has been started on Pentoxifylline by her PCP. No chest pain or shortness of breath. No leg swelling. She does admit to discomfort in her lower legs with walking.  Past Medical History  Diagnosis Date  . Hypertension   . Diabetes mellitus   . CAD (coronary artery disease)   . DM (diabetes mellitus) ([place])   . HLD (hyperlipidemia)   . Chest pain   . Heart burn   . Fatigue   . Constipation   . Contusion of arm   . Joint stiffness of hand   . Urinary incontinence   . Vitamin D deficiency   . Anemia   . Arthritis   . Cataract, diabetic ([place])   . Diabetic retinopathy   . Glaucoma   . Hiatal hernia   . Esophageal stricture     Past Surgical History  Procedure Laterality Date  . Coronary angioplasty with stent placement  10/04  . Cardiac catheterization  10/04    s/p stent LV 45%  . Adenosine  cardillite  05/16/05    negative signif. myocardial ischemic  . Cesarean section  84, 86    ectopic pregnancy  . Cholecystectomy  1998  . Cataract extraction      bilateral  . Partial hysterectomy      Current Outpatient Prescriptions  Medication Sig Dispense Refill  . ALPRAZolam (XANAX) 0.5 MG tablet Take 0.5 mg by mouth 2 (two) times daily as needed for anxiety.   0  . amLODipine (NORVASC) 10 MG tablet Take 10 mg by mouth daily.    Marland Kitchen aspirin 81 MG EC tablet Take 81 mg by mouth daily.      . Calcium Carbonate-Vitamin D (CALTRATE 600+D) 600-400 MG-UNIT per tablet Take 1 tablet by mouth daily.     Marland Kitchen FLUZONE HIGH-DOSE 0.5 ML SUSY   0  . gabapentin (NEURONTIN) 100 MG capsule Take 100 mg by mouth daily.  2  . lisinopril-hydrochlorothiazide (PRINZIDE,ZESTORETIC) 20-25 MG per tablet Take 1 tablet by mouth daily.      Marland Kitchen lovastatin (MEVACOR) 10 MG tablet Take 10 mg by mouth at bedtime.     . metFORMIN (GLUCOPHAGE) 500 MG tablet Take 500 mg by mouth daily with breakfast.      . Multiple Vitamin (MULTIVITAMIN) tablet Take 1 tablet by mouth daily.      . nitroGLYCERIN (NITROSTAT) 0.4 MG SL  tablet Place 0.4 mg under the tongue every 5 (five) minutes as needed for chest pain (3 doses max).     Marland Kitchen oxybutynin (DITROPAN) 5 MG tablet Take 5 mg by mouth 2 (two) times daily.      . pentoxifylline (TRENTAL) 400 MG CR tablet Take 400 mg by mouth 2 (two) times daily.    . polyethylene glycol powder (MIRALAX) powder Take 17 g by mouth daily as needed. Constipation    . triamcinolone (KENALOG) 0.1 % cream Apply 1 application topically 2 (two) times daily.      No current facility-administered medications for this visit.    Allergies:   Penicillins and Milk-related compounds   Social History:  The patient  reports that she quit smoking about 17 years ago. She has never used smokeless tobacco. She reports that she does not drink alcohol or use illicit drugs.   Family History:  The patient's  family history  includes Crohn's disease in her brother, brother, and brother; Diabetes in an other family member; Heart attack in her father; Heart disease in her mother. There is no history of Colon cancer.    ROS:  Please see the history of present illness.  Otherwise, review of systems is positive for dizziness, gait unsteadiness.  All other systems are reviewed and negative.    PHYSICAL EXAM: VS:  BP 120/62 mmHg  Pulse 66  Ht 5\' 1"  (1.549 m)  Wt 139 lb 6.4 oz (63.231 kg)  BMI 26.35 kg/m2 , BMI Body mass index is 26.35 kg/(m^2). GEN: Well nourished, well developed, in no acute distress HEENT: normal Neck: no JVD, no masses. No carotid bruits Cardiac: RRR without murmur or gallop                Respiratory:  clear to auscultation bilaterally, normal work of breathing GI: soft, nontender, nondistended, + BS MS: no deformity or atrophy Ext: no pretibial edema, pedal pulses 2+= bilaterally Skin: warm and dry, no rash Neuro:  Strength and sensation are intact Psych: euthymic mood, full affect  EKG:  EKG is ordered today. The ekg ordered today shows NSR 66 bpm, moderate voltage criteria for LVH may be normal variant.  Recent Labs: No results found for requested labs within last 365 days.   Lipid Panel     Component Value Date/Time   CHOL 165 05/29/2010 2029   TRIG 46 05/29/2010 2029   HDL 76 05/29/2010 2029   CHOLHDL 2.2 Ratio 05/29/2010 2029   VLDL 9 05/29/2010 2029   LDLCALC 80 05/29/2010 2029      Wt Readings from Last 3 Encounters:  02/13/15 139 lb 6.4 oz (63.231 kg)  02/13/14 138 lb (62.596 kg)  12/29/12 128 lb (58.06 kg)     Cardiac Studies Reviewed: Stress Echo 02/28/2014: Study Conclusions - Stress ECG conclusions: The stress ECG was normal. But the patient has poor tolerance. - Baseline: LV global systolic function was normal. The estimated LV ejection fraction was 55%. Normal wall motion; no LV regional wall motion abnormalities. - Peak stress: LV global systolic  function was vigorous. Normal wall motion; no LV regional wall motion abnormalities. - Impressions: Normal increase in thickening and contractilithy in all segments. Normal stress echo with poor exercise tolerance.  Impressions:  - Normal increase in thickening and contractilithy in all segments. Normal stress echo with poor exercise tolerance.  ASSESSMENT AND PLAN: 1.  CAD, native vessel, without symptoms of angina: Stress echocardiogram 1 year ago reviewed with no ischemic changes. Continue medical  management and observation. Follow-up in one year.  2. Hyperlipidemia: Patient is treated with lovastatin and followed by her primary physician.  3. Leg pain/gait unsteadiness: Suspect primarily related to diabetes and peripheral neuropathy. However, I think we should evaluate her for lower extremity peripheral arterial disease with a Doppler study. This will be ordered.  Current medicines are reviewed with the patient today.  The patient does not have concerns regarding medicines.  Labs/ tests ordered today include:  No orders of the defined types were placed in this encounter.    Disposition:   FU one year  Signed, Sherren Mocha, MD  02/13/2015 10:14 AM    [facility] [place], [place], [place]  [address] Phone: [phone]; Fax: [phone]

## 2015-02-19 ENCOUNTER — Ambulatory Visit (HOSPITAL_COMMUNITY)
Admission: RE | Admit: 2015-02-19 | Discharge: 2015-02-19 | Disposition: A | Discharge status: Home / Self Care | Payer: Medicare HMO | Source: Ambulatory Visit | Attending: Cardiology | Admitting: Cardiology

## 2015-02-19 DIAGNOSIS — E11319 Type 2 diabetes mellitus with unspecified diabetic retinopathy without macular edema: Secondary | ICD-10-CM | POA: Diagnosis not present

## 2015-02-19 DIAGNOSIS — M79606 Pain in leg, unspecified: Secondary | ICD-10-CM | POA: Insufficient documentation

## 2015-02-19 DIAGNOSIS — E785 Hyperlipidemia, unspecified: Secondary | ICD-10-CM | POA: Insufficient documentation

## 2015-02-19 DIAGNOSIS — I739 Peripheral vascular disease, unspecified: Secondary | ICD-10-CM | POA: Diagnosis not present

## 2015-02-19 DIAGNOSIS — I10 Essential (primary) hypertension: Secondary | ICD-10-CM | POA: Diagnosis not present

## 2015-03-02 ENCOUNTER — Ambulatory Visit: Payer: Medicare HMO | Admitting: Physician Assistant

## 2015-06-26 ENCOUNTER — Other Ambulatory Visit: Payer: Self-pay

## 2015-06-26 DIAGNOSIS — Z1231 Encounter for screening mammogram for malignant neoplasm of breast: Secondary | ICD-10-CM

## 2015-07-13 ENCOUNTER — Ambulatory Visit
Admission: RE | Admit: 2015-07-13 | Discharge: 2015-07-13 | Disposition: A | Discharge status: Home / Self Care | Payer: Medicare HMO | Source: Ambulatory Visit

## 2015-07-13 DIAGNOSIS — Z1231 Encounter for screening mammogram for malignant neoplasm of breast: Secondary | ICD-10-CM

## 2015-08-20 ENCOUNTER — Encounter (HOSPITAL_COMMUNITY): Payer: Self-pay | Admitting: *Deleted

## 2015-08-20 ENCOUNTER — Emergency Department (HOSPITAL_COMMUNITY)
Admission: EM | Admit: 2015-08-20 | Discharge: 2015-08-20 | Disposition: A | Discharge status: Home / Self Care | Payer: Medicare HMO | Attending: Emergency Medicine | Admitting: Emergency Medicine

## 2015-08-20 DIAGNOSIS — Z87891 Personal history of nicotine dependence: Secondary | ICD-10-CM | POA: Insufficient documentation

## 2015-08-20 DIAGNOSIS — Z880 Allergy status to penicillin: Secondary | ICD-10-CM | POA: Diagnosis not present

## 2015-08-20 DIAGNOSIS — E11319 Type 2 diabetes mellitus with unspecified diabetic retinopathy without macular edema: Secondary | ICD-10-CM | POA: Insufficient documentation

## 2015-08-20 DIAGNOSIS — E785 Hyperlipidemia, unspecified: Secondary | ICD-10-CM | POA: Insufficient documentation

## 2015-08-20 DIAGNOSIS — L03032 Cellulitis of left toe: Secondary | ICD-10-CM | POA: Diagnosis not present

## 2015-08-20 DIAGNOSIS — Z862 Personal history of diseases of the blood and blood-forming organs and certain disorders involving the immune mechanism: Secondary | ICD-10-CM | POA: Diagnosis not present

## 2015-08-20 DIAGNOSIS — Z7952 Long term (current) use of systemic steroids: Secondary | ICD-10-CM | POA: Insufficient documentation

## 2015-08-20 DIAGNOSIS — Z87828 Personal history of other (healed) physical injury and trauma: Secondary | ICD-10-CM | POA: Diagnosis not present

## 2015-08-20 DIAGNOSIS — I2510 Atherosclerotic heart disease of native coronary artery without angina pectoris: Secondary | ICD-10-CM | POA: Diagnosis not present

## 2015-08-20 DIAGNOSIS — M1990 Unspecified osteoarthritis, unspecified site: Secondary | ICD-10-CM | POA: Diagnosis not present

## 2015-08-20 DIAGNOSIS — I739 Peripheral vascular disease, unspecified: Secondary | ICD-10-CM | POA: Diagnosis not present

## 2015-08-20 DIAGNOSIS — Z9861 Coronary angioplasty status: Secondary | ICD-10-CM | POA: Diagnosis not present

## 2015-08-20 DIAGNOSIS — L03012 Cellulitis of left finger: Secondary | ICD-10-CM

## 2015-08-20 DIAGNOSIS — Z7982 Long term (current) use of aspirin: Secondary | ICD-10-CM | POA: Insufficient documentation

## 2015-08-20 DIAGNOSIS — K5900 Constipation, unspecified: Secondary | ICD-10-CM | POA: Insufficient documentation

## 2015-08-20 DIAGNOSIS — M79675 Pain in left toe(s): Secondary | ICD-10-CM | POA: Diagnosis present

## 2015-08-20 DIAGNOSIS — Z79899 Other long term (current) drug therapy: Secondary | ICD-10-CM | POA: Diagnosis not present

## 2015-08-20 DIAGNOSIS — E559 Vitamin D deficiency, unspecified: Secondary | ICD-10-CM | POA: Diagnosis not present

## 2015-08-20 DIAGNOSIS — M79605 Pain in left leg: Secondary | ICD-10-CM | POA: Insufficient documentation

## 2015-08-20 DIAGNOSIS — I10 Essential (primary) hypertension: Secondary | ICD-10-CM | POA: Diagnosis not present

## 2015-08-20 LAB — CBC WITH DIFFERENTIAL/PLATELET
BASOS ABS: 0 10*3/uL (ref 0.0–0.1)
Basophils Relative: 0 %
Eosinophils Absolute: 0.1 10*3/uL (ref 0.0–0.7)
Eosinophils Relative: 3 %
HEMATOCRIT: 35.1 % — AB (ref 36.0–46.0)
Hemoglobin: 11.4 g/dL — ABNORMAL LOW (ref 12.0–15.0)
LYMPHS PCT: 33 %
Lymphs Abs: 1.5 10*3/uL (ref 0.7–4.0)
MCH: 29.4 pg (ref 26.0–34.0)
MCHC: 32.5 g/dL (ref 30.0–36.0)
MCV: 90.5 fL (ref 78.0–100.0)
Monocytes Absolute: 0.4 10*3/uL (ref 0.1–1.0)
Monocytes Relative: 9 %
NEUTROS ABS: 2.5 10*3/uL (ref 1.7–7.7)
Neutrophils Relative %: 55 %
PLATELETS: 171 10*3/uL (ref 150–400)
RBC: 3.88 MIL/uL (ref 3.87–5.11)
RDW: 13.7 % (ref 11.5–15.5)
WBC: 4.5 10*3/uL (ref 4.0–10.5)

## 2015-08-20 LAB — BASIC METABOLIC PANEL
ANION GAP: 9 (ref 5–15)
BUN: 18 mg/dL (ref 6–20)
CO2: 24 mmol/L (ref 22–32)
Calcium: 9.7 mg/dL (ref 8.9–10.3)
Chloride: 106 mmol/L (ref 101–111)
Creatinine, Ser: 1.34 mg/dL — ABNORMAL HIGH (ref 0.44–1.00)
GFR, EST AFRICAN AMERICAN: 44 mL/min — AB (ref >60)
GFR, EST NON AFRICAN AMERICAN: 38 mL/min — AB (ref >60)
GLUCOSE: 116 mg/dL — AB (ref 65–99)
POTASSIUM: 3.8 mmol/L (ref 3.5–5.1)
Sodium: 139 mmol/L (ref 135–145)

## 2015-08-20 MED ORDER — BUPIVACAINE HCL (PF) 0.5 % IJ SOLN
20.0000 mL | Freq: Once | INTRAMUSCULAR | Status: AC
  Administered 2015-08-20: 20 mL
  Filled 2015-08-20: qty 20

## 2015-08-20 MED ORDER — OXYCODONE-ACETAMINOPHEN 5-325 MG PO TABS
1.0000 | ORAL_TABLET | ORAL | Status: DC | PRN
  Administered 2015-08-20: 1 via ORAL

## 2015-08-20 MED ORDER — OXYCODONE-ACETAMINOPHEN 5-325 MG PO TABS
ORAL_TABLET | ORAL | Status: AC
  Filled 2015-08-20: qty 1

## 2015-08-20 NOTE — ED Notes (Signed)
Pts name called for a room x2 no answer

## 2015-08-20 NOTE — ED Notes (Signed)
Pt came to secretary's desk and said that the person calling names kept saying Evelyn Morrison so they didn't answer when the name was called. Secretary came and asked me Pt undischarged and put back in line

## 2015-08-20 NOTE — ED Notes (Signed)
Pt reports ongoing pain from left foot up into lower leg and into thigh/hip. Has swollen area with pus noted to left big toe.

## 2015-08-20 NOTE — ED Notes (Signed)
Called pt with no answer.

## 2015-08-20 NOTE — ED Provider Notes (Signed)
CSN: [id number]     Arrival date & time 08/20/15  1222 History   First MD Initiated Contact with Patient 08/20/15 1847     Chief Complaint  Patient presents with  . Leg Pain  . Foot Pain     (Consider location/radiation/quality/duration/timing/severity/associated sxs/prior Treatment) HPI  Evelyn Morrison is a 75 y.o F with a pmhx of DM, CAD, HLD, HTN who presents the ED complaining of left leg pain and toe pain. Patient states that she's been having progressively worsening left lower extremity pain with walking the last several months. Patient states that when she walks she feels pain shooting up her calf and into her left hip. She states that the pain is relieved when she is at rest. She states in the last week the pain has gotten significantly worse. She states that she had an ultrasound done of her leg a couple of months ago which was normal. Patient also states that 3-4 days ago she noticed that her left great toe was turning red. She was unable to get in to see her primary care provider. Over last couple of days she noticed that pus has developed around her big toenail and is very painful for her. She denies any lower external swelling, chest pain, shortness of breath, fevers, chills. Past Medical History  Diagnosis Date  . Hypertension   . Diabetes mellitus   . CAD (coronary artery disease)   . DM (diabetes mellitus) ([place])   . HLD (hyperlipidemia)   . Chest pain   . Heart burn   . Fatigue   . Constipation   . Contusion of arm   . Joint stiffness of hand   . Urinary incontinence   . Vitamin D deficiency   . Anemia   . Arthritis   . Cataract, diabetic ([place])   . Diabetic retinopathy   . Glaucoma   . Hiatal hernia   . Esophageal stricture    Past Surgical History  Procedure Laterality Date  . Coronary angioplasty with stent placement  10/04  . Cardiac catheterization  10/04    s/p stent LV 45%  . Adenosine cardillite  05/16/05    negative signif. myocardial ischemic  .  Cesarean section  84, 86    ectopic pregnancy  . Cholecystectomy  1998  . Cataract extraction      bilateral  . Partial hysterectomy     Family History  Problem Relation Age of Onset  . Heart disease Mother   . Heart attack Father   . Colon cancer Neg Hx   . Diabetes    . Crohn's disease Brother   . Crohn's disease Brother   . Crohn's disease Brother    Social History  Substance Use Topics  . Smoking status: Former Smoker    Quit date: 03/31/1997  . Smokeless tobacco: Never Used  . Alcohol Use: No     Comment: ocassional   OB History    No data available     Review of Systems    Allergies  Penicillins and Milk-related compounds  Home Medications   Prior to Admission medications   Medication Sig Start Date End Date Taking? Authorizing Provider  ALPRAZolam Duanne Moron) 0.5 MG tablet Take 0.5 mg by mouth 2 (two) times daily as needed for anxiety.  11/11/13   Historical Provider, MD  amLODipine (NORVASC) 10 MG tablet Take 10 mg by mouth daily. 02/06/15   Historical Provider, MD  aspirin 81 MG EC tablet Take 81 mg by mouth daily.  Historical Provider, MD  Calcium Carbonate-Vitamin D (CALTRATE 600+D) 600-400 MG-UNIT per tablet Take 1 tablet by mouth daily.     Historical Provider, MD  FLUZONE HIGH-DOSE 0.5 ML SUSY  01/29/14   Historical Provider, MD  gabapentin (NEURONTIN) 100 MG capsule Take 100 mg by mouth daily. 01/23/15   Historical Provider, MD  lisinopril-hydrochlorothiazide (PRINZIDE,ZESTORETIC) 20-25 MG per tablet Take 1 tablet by mouth daily.      Historical Provider, MD  lovastatin (MEVACOR) 10 MG tablet Take 10 mg by mouth at bedtime.  01/16/14   Historical Provider, MD  metFORMIN (GLUCOPHAGE) 500 MG tablet Take 500 mg by mouth daily with breakfast.      Historical Provider, MD  Multiple Vitamin (MULTIVITAMIN) tablet Take 1 tablet by mouth daily.      Historical Provider, MD  nitroGLYCERIN (NITROSTAT) 0.4 MG SL tablet Place 0.4 mg under the tongue every 5 (five)  minutes as needed for chest pain (3 doses max).     Historical Provider, MD  oxybutynin (DITROPAN) 5 MG tablet Take 5 mg by mouth 2 (two) times daily.      Historical Provider, MD  pentoxifylline (TRENTAL) 400 MG CR tablet Take 400 mg by mouth 2 (two) times daily. 02/06/15   Historical Provider, MD  polyethylene glycol powder (MIRALAX) powder Take 17 g by mouth daily as needed. Constipation    Historical Provider, MD  triamcinolone (KENALOG) 0.1 % cream Apply 1 application topically 2 (two) times daily.     Historical Provider, MD   BP 101/71 mmHg  Pulse 70  Temp(Src) 98 F (36.7 C) (Oral)  Resp 18  SpO2 100% Physical Exam  Constitutional: She is oriented to person, place, and time. She appears well-developed and well-nourished. No distress.  HENT:  Head: Normocephalic and atraumatic.  Mouth/Throat: No oropharyngeal exudate.  Eyes: Conjunctivae and EOM are normal. Pupils are equal, round, and reactive to light. Right eye exhibits no discharge. Left eye exhibits no discharge. No scleral icterus.  Cardiovascular: Normal rate, regular rhythm, normal heart sounds and intact distal pulses.  Exam reveals no gallop and no friction rub.   No murmur heard. Pulmonary/Chest: Effort normal and breath sounds normal. No respiratory distress. She has no wheezes. She has no rales. She exhibits no tenderness.  Abdominal: Soft. She exhibits no distension. There is no tenderness. There is no guarding.  Musculoskeletal: Normal range of motion. She exhibits no edema.  No decrease ROM of left hip or knee. NO LE edema. No calf tenderness. Negative Homan's sign.   Neurological: She is alert and oriented to person, place, and time.  Strength 5/5 throughout. No sensory deficits.    Skin: Skin is warm and dry. No rash noted. She is not diaphoretic. No erythema. No pallor.  Large paronychia around left great toe. No swelling or erythema on flexor surface of great toe. No streaking.  Psychiatric: She has a normal  mood and affect. Her behavior is normal.  Nursing note and vitals reviewed.   ED Course  .Nerve Block Date/Time: 08/20/2015 11:05 PM Performed by: Donnald Garre TRIPP Authorized by: Donnald Garre TRIPP Consent: Verbal consent obtained. Consent given by: patient Patient understanding: patient states understanding of the procedure being performed Site marked: the operative site was marked Patient identity confirmed: verbally with patient Indications comments: Paronychia Body area: lower extremity Nerve: digital Laterality: left Patient sedated: no Needle gauge: 24 G Location technique: anatomical landmarks Local anesthetic: bupivacaine 0.25% with epinephrine Outcome: pain improved  Drain paronychia Date/Time: 08/20/2015 11:39 PM  Performed by: Donnald Garre TRIPP Authorized by: Donnald Garre TRIPP Consent given by: patient Patient understanding: patient states understanding of the procedure being performed Site marked: the operative site was marked Patient identity confirmed: verbally with patient Preparation: Patient was prepped and draped in the usual sterile fashion. Local anesthesia used: yes Anesthesia: digital block Local anesthetic: bupivacaine 0.25% with epinephrine   (including critical care time)    Labs Review Labs Reviewed  CBC WITH DIFFERENTIAL/PLATELET - Abnormal; Notable for the following:    Hemoglobin 11.4 (*)    HCT 35.1 (*)    All other components within normal limits  BASIC METABOLIC PANEL    Imaging Review No results found. I have personally reviewed and evaluated these images and lab results as part of my medical decision-making.   EKG Interpretation None      MDM   Final diagnoses:  Paronychia, left  Left leg claudication ([place])    75 year old female with history of diabetes, HTN, HLD because the ED with progressively worsening left leg pain and left great toe pain. Patient reports that she is been experiencing a  sensation like "pins and needles" in her left lower extremity that is worsened with walking and relieved with rest. She's been evaluated previously for this by her cardiologist and had a vascular arterial study done. Her ABI at that time was 1.19 and a optional did not show occlusive PAD. Patient's cardiologist felt that her symptoms are likely related to worsening peripheral neuropathy. Symptoms do seem to be related to intermittent claudication. As her symptoms are worsening we'll recommend that she follows up with vascular surgery. Patient also follow-up with her PCP. If symptoms are related to neurogenic claudication she may require medication similar to gabapentin or lyrica to help with symptoms. Patient also has large paronychia to left great toe. Nerve block performed and paronychia was drained in the ED. Patient tolerated this procedure well. Encouraged warm soaks. No antibiotic indication at this time. Discussed treatment plan with patient who is agreeable. Return precautions outlined in patient discharge instructions.  Patient was discussed with and seen by Dr. Gilford Raid who agrees with the treatment plan.      Dondra Spry [place], PA-C 08/20/15 2342  Isla Pence, MD 08/21/15 [phone]

## 2015-08-20 NOTE — Discharge Instructions (Signed)
Intermittent Claudication Intermittent claudication is pain in your leg that occurs when you walk or exercise and goes away when you rest. The pain can occur in one or both legs. CAUSES Intermittent claudication is caused by the buildup of plaque within the major arteries in the body (atherosclerosis). The plaque, which makes arteries stiff and narrow, prevents enough blood from reaching your leg muscles. The pain occurs when you walk or exercise because your muscles need more blood when you are moving and exercising. RISK FACTORS Risk factors include:  A family history of atherosclerosis.  A personal history of stroke or heart disease.  Older age.  Being inactive or overweight.  Smoking cigarettes.  Having another health condition such as:  Diabetes.  High blood pressure.  High cholesterol. SIGNS AND SYMPTOMS  Your hip or leg may:   Ache.  Cramp.  Feel tight.  Feel weak.  Feel heavy. Over time, you may feel pain in your calf, thigh, or hip. DIAGNOSIS  Your health care provider may diagnose intermittent claudication based on your symptoms and medical history. Your health care provider may also do tests to learn more about your condition. These may include:  Blood tests.  An ultrasound.  Imaging tests such as angiography, magnetic resonance angiography (MRA), and computed tomography angiography (CTA). TREATMENT You may be treated for problems such as:  High blood pressure.  High cholesterol.  Diabetes. Other treatments may include:  Lifestyle changes such as:  Starting an exercise program.  Losing weight.  Quitting smoking.  Medicines to help restore blood flow through your legs.  Blood vessel surgery (angioplasty) to restore blood flow if your intermittent claudication is caused by severe peripheral artery disease. HOME CARE INSTRUCTIONS  Manage any other health conditions you have.  Eat a diet low in saturated fats and calories to maintain a  healthy weight.  Quit smoking, if you smoke.  Take medicines only as directed by your health care provider.  If your health care provider recommended an exercise program for you, follow it as directed. Your exercise program may involve:  Walking three or more times a week.  Walking until you have certain symptoms of intermittent claudication.  Resting until symptoms go away.  Gradually increasing walking time to about 50 minutes a day. SEEK MEDICAL CARE IF: Your condition is not getting better or is getting worse. SEEK IMMEDIATE MEDICAL CARE IF:   You have chest pain.  You have difficulty breathing.  You develop arm weakness.  You have trouble speaking.  Your face begins to droop. MAKE SURE YOU:  Understand these instructions.  Will watch your condition.  Will get help if you are not doing well or get worse.   This information is not intended to replace advice given to you by your health care provider. Make sure you discuss any questions you have with your health care provider.   Document Released: 01/18/2004 Document Revised: 04/07/2014 Document Reviewed: 06/23/2013 Elsevier Interactive Patient Education 2016 [employer]  Neuropathic Pain Neuropathic pain is pain caused by damage to the nerves that are responsible for certain sensations in your body (sensory nerves). The pain can be caused by damage to:   The sensory nerves that send signals to your spinal cord and brain (peripheral nervous system).  The sensory nerves in your brain or spinal cord (central nervous system). Neuropathic pain can make you more sensitive to pain. What would be a minor sensation for most people may feel very painful if you have neuropathic pain. This  is usually a long-term condition that can be difficult to treat. The type of pain can differ from person to person. It may start suddenly (acute), or it may develop slowly and last for a long time (chronic). Neuropathic pain may come and go  as damaged nerves heal or may stay at the same level for years. It often causes emotional distress, loss of sleep, and a lower quality of life. CAUSES  The most common cause of damage to a sensory nerve is diabetes. Many other diseases and conditions can also cause neuropathic pain. Causes of neuropathic pain can be classified as:  Toxic. Many drugs and chemicals can cause toxic damage. The most common cause of toxic neuropathic pain is damage from drug treatment for cancer (chemotherapy).  Metabolic. This type of pain can happen when a disease causes imbalances that damage nerves. Diabetes is the most common of these diseases. Vitamin B deficiency caused by long-term alcohol abuse is another common cause.  Traumatic. Any injury that cuts, crushes, or stretches a nerve can cause damage and pain. A common example is feeling pain after losing an arm or leg (phantom limb pain).  Compression-related. If a sensory nerve gets trapped or compressed for a long period of time, the blood supply to the nerve can be cut off.  Vascular. Many blood vessel diseases can cause neuropathic pain by decreasing blood supply and oxygen to nerves.  Autoimmune. This type of pain results from diseases in which the body's defense system mistakenly attacks sensory nerves. Examples of autoimmune diseases that can cause neuropathic pain include lupus and multiple sclerosis.  Infectious. Many types of viral infections can damage sensory nerves and cause pain. Shingles infection is a common cause of this type of pain.  Inherited. Neuropathic pain can be a symptom of many diseases that are passed down through families (genetic). SIGNS AND SYMPTOMS  The main symptom is pain. Neuropathic pain is often described as:  Burning.  Shock-like.  Stinging.  Hot or cold.  Itching. DIAGNOSIS  No single test can diagnose neuropathic pain. Your health care provider will do a physical exam and ask you about your pain. You may use  a pain scale to describe how bad your pain is. You may also have tests to see if you have a high sensitivity to pain and to help find the cause and location of any sensory nerve damage. These tests may include:  Imaging studies, such as:  X-rays.  CT scan.  MRI.  Nerve conduction studies to test how well nerve signals travel through your sensory nerves (electrodiagnostic testing).  Stimulating your sensory nerves through electrodes on your skin and measuring the response in your spinal cord and brain (somatosensory evoked potentials). TREATMENT  Treatment for neuropathic pain may change over time. You may need to try different treatment options or a combination of treatments. Some options include:  Over-the-counter pain relievers.  Prescription medicines. Some medicines used to treat other conditions may also help neuropathic pain. These include medicines to:  Control seizures (anticonvulsants).  Relieve depression (antidepressants).  Prescription-strength pain relievers (narcotics). These are usually used when other pain relievers do not help.  Transcutaneous nerve stimulation (TENS). This uses electrical currents to block painful nerve signals. The treatment is painless.  Topical and local anesthetics. These are medicines that numb the nerves. They can be injected as a nerve block or applied to the skin.  Alternative treatments, such as:  Acupuncture.  Meditation.  Massage.  Physical therapy.  Pain management programs.  Counseling. HOME CARE INSTRUCTIONS  Learn as much as you can about your condition.  Take medicines only as directed by your health care provider.  Work closely with all your health care providers to find what works best for you.  Have a good support system at home.  Consider joining a chronic pain support group. SEEK MEDICAL CARE IF:  Your pain treatments are not helping.  You are having side effects from your medicines.  You are  struggling with fatigue, mood changes, depression, or anxiety.   This information is not intended to replace advice given to you by your health care provider. Make sure you discuss any questions you have with your health care provider.   Document Released: 12/13/2003 Document Revised: 04/07/2014 Document Reviewed: 08/25/2013 Elsevier Interactive Patient Education 2016 [place] is an infection of the skin that surrounds a nail. It usually affects the skin around a fingernail, but it may also occur near a toenail. It often causes pain and swelling around the nail. This condition may come on suddenly or develop over a longer period. In some cases, a collection of pus (abscess) can form near or under the nail. Usually, paronychia is not serious and it clears up with treatment. CAUSES This condition may be caused by bacteria or fungi. It is commonly caused by either Streptococcus or Staphylococcus bacteria. The bacteria or fungi often cause the infection by getting into the affected area through an opening in the skin, such as a cut or a hangnail. RISK FACTORS This condition is more likely to develop in:  People who get their hands wet often, such as those who work as Designer, industrial/product, bartenders, or nurses.  People who bite their fingernails or suck their thumbs.  People who trim their nails too short.  People who have hangnails or injured fingertips.  People who get manicures.  People who have diabetes. SYMPTOMS Symptoms of this condition include:  Redness and swelling of the skin near the nail.  Tenderness around the nail when you touch the area.  Pus-filled bumps under the cuticle. The cuticle is the skin at the base or sides of the nail.  Fluid or pus under the nail.  Throbbing pain in the area. DIAGNOSIS This condition is usually diagnosed with a physical exam. In some cases, a sample of pus may be taken from an abscess to be tested in a lab. This can  help to determine what type of bacteria or fungi is causing the condition. TREATMENT Treatment for this condition depends on the cause and severity of the condition. If the condition is mild, it may clear up on its own in a few days. Your health care provider may recommend soaking the affected area in warm water a few times a day. When treatment is needed, the options may include:  Antibiotic medicine, if the condition is caused by a bacterial infection.  Antifungal medicine, if the condition is caused by a fungal infection.  Incision and drainage, if an abscess is present. In this procedure, the health care provider will cut open the abscess so the pus can drain out. HOME CARE INSTRUCTIONS  Soak the affected area in warm water if directed to do so by your health care provider. You may be told to do this for 20 minutes, 2-3 times a day. Keep the area dry in between soakings.  Take medicines only as directed by your health care provider.  If you were prescribed an antibiotic medicine, finish all of it  even if you start to feel better.  Keep the affected area clean.  Do not try to drain a fluid-filled bump yourself.  If you will be washing dishes or performing other tasks that require your hands to get wet, wear rubber gloves. You should also wear gloves if your hands might come in contact with irritating substances, such as cleaners or chemicals.  Follow your health care provider's instructions about:  Wound care.  Bandage (dressing) changes and removal. SEEK MEDICAL CARE IF:  Your symptoms get worse or do not improve with treatment.  You have a fever or chills.  You have redness spreading from the affected area.  You have continued or increased fluid, blood, or pus coming from the affected area.  Your finger or knuckle becomes swollen or is difficult to move.   This information is not intended to replace advice given to you by your health care provider. Make sure you discuss  any questions you have with your health care provider.   Follow up with your primary care provider if your symptoms do not improve. Follow up with Vascular surgery for left leg pain and Podiatry for general foot health. Continue to soak foot in hot water at home. You do not require antibiotics at this time. Return to the ED if you experience severe worsening of your symptoms, fevers, chills, left leg swelling, chest pain or shortness of breath.

## 2015-08-22 ENCOUNTER — Telehealth: Payer: Self-pay | Admitting: Surgery

## 2015-08-22 NOTE — Telephone Encounter (Signed)
Pt lm to sch an appt for her feet. She did not give any other information. She is not a pt of ours. Called her hm and cell#, hm# has no vm, cell# would not go through. Pt needs to have a referral sent to our office.

## 2015-08-28 ENCOUNTER — Encounter: Payer: Medicare HMO | Admitting: Vascular Surgery

## 2015-10-04 ENCOUNTER — Other Ambulatory Visit: Payer: Self-pay | Admitting: *Deleted

## 2015-10-04 DIAGNOSIS — R202 Paresthesia of skin: Secondary | ICD-10-CM

## 2015-10-05 ENCOUNTER — Encounter (HOSPITAL_COMMUNITY): Payer: Self-pay | Admitting: *Deleted

## 2015-10-05 ENCOUNTER — Emergency Department (HOSPITAL_COMMUNITY)
Admission: EM | Admit: 2015-10-05 | Discharge: 2015-10-06 | Disposition: A | Discharge status: Home / Self Care | Payer: Medicare HMO | Attending: Emergency Medicine | Admitting: Emergency Medicine

## 2015-10-05 DIAGNOSIS — R55 Syncope and collapse: Secondary | ICD-10-CM

## 2015-10-05 DIAGNOSIS — E860 Dehydration: Secondary | ICD-10-CM

## 2015-10-05 DIAGNOSIS — R197 Diarrhea, unspecified: Secondary | ICD-10-CM | POA: Diagnosis present

## 2015-10-05 DIAGNOSIS — Z87891 Personal history of nicotine dependence: Secondary | ICD-10-CM | POA: Insufficient documentation

## 2015-10-05 DIAGNOSIS — I2510 Atherosclerotic heart disease of native coronary artery without angina pectoris: Secondary | ICD-10-CM | POA: Diagnosis not present

## 2015-10-05 DIAGNOSIS — E11319 Type 2 diabetes mellitus with unspecified diabetic retinopathy without macular edema: Secondary | ICD-10-CM | POA: Insufficient documentation

## 2015-10-05 DIAGNOSIS — I10 Essential (primary) hypertension: Secondary | ICD-10-CM | POA: Diagnosis not present

## 2015-10-05 DIAGNOSIS — Z7982 Long term (current) use of aspirin: Secondary | ICD-10-CM | POA: Insufficient documentation

## 2015-10-05 DIAGNOSIS — Z79899 Other long term (current) drug therapy: Secondary | ICD-10-CM | POA: Diagnosis not present

## 2015-10-05 DIAGNOSIS — Z7984 Long term (current) use of oral hypoglycemic drugs: Secondary | ICD-10-CM | POA: Insufficient documentation

## 2015-10-05 LAB — CBC
HEMATOCRIT: 35.1 % — AB (ref 36.0–46.0)
Hemoglobin: 11.7 g/dL — ABNORMAL LOW (ref 12.0–15.0)
MCH: 30.1 pg (ref 26.0–34.0)
MCHC: 33.3 g/dL (ref 30.0–36.0)
MCV: 90.2 fL (ref 78.0–100.0)
PLATELETS: 198 10*3/uL (ref 150–400)
RBC: 3.89 MIL/uL (ref 3.87–5.11)
RDW: 14.1 % (ref 11.5–15.5)
WBC: 6.8 10*3/uL (ref 4.0–10.5)

## 2015-10-05 LAB — URINALYSIS, ROUTINE W REFLEX MICROSCOPIC
BILIRUBIN URINE: NEGATIVE
Glucose, UA: NEGATIVE mg/dL
Hgb urine dipstick: NEGATIVE
KETONES UR: NEGATIVE mg/dL
Leukocytes, UA: NEGATIVE
NITRITE: NEGATIVE
PH: 7.5 (ref 5.0–8.0)
PROTEIN: NEGATIVE mg/dL
Specific Gravity, Urine: 1.005 (ref 1.005–1.030)

## 2015-10-05 LAB — BASIC METABOLIC PANEL
Anion gap: 12 (ref 5–15)
BUN: 16 mg/dL (ref 6–20)
CALCIUM: 10.2 mg/dL (ref 8.9–10.3)
CO2: 21 mmol/L — ABNORMAL LOW (ref 22–32)
CREATININE: 1.36 mg/dL — AB (ref 0.44–1.00)
Chloride: 104 mmol/L (ref 101–111)
GFR calc Af Amer: 44 mL/min — ABNORMAL LOW (ref >60)
GFR, EST NON AFRICAN AMERICAN: 38 mL/min — AB (ref >60)
GLUCOSE: 104 mg/dL — AB (ref 65–99)
POTASSIUM: 3.9 mmol/L (ref 3.5–5.1)
SODIUM: 137 mmol/L (ref 135–145)

## 2015-10-05 LAB — I-STAT TROPONIN, ED: TROPONIN I, POC: 0 ng/mL (ref 0.00–0.08)

## 2015-10-05 LAB — CBG MONITORING, ED: GLUCOSE-CAPILLARY: 91 mg/dL (ref 65–99)

## 2015-10-05 MED ORDER — SODIUM CHLORIDE 0.9 % IV BOLUS (SEPSIS): 1000.0000 mL | Freq: Once | INTRAVENOUS | Status: DC

## 2015-10-05 MED ORDER — ONDANSETRON 4 MG PO TBDP
ORAL_TABLET | ORAL | Status: AC
  Filled 2015-10-05: qty 1

## 2015-10-05 MED ORDER — SODIUM CHLORIDE 0.9 % IV BOLUS (SEPSIS)
1000.0000 mL | Freq: Once | INTRAVENOUS | Status: AC
  Administered 2015-10-05: 1000 mL via INTRAVENOUS

## 2015-10-05 MED ORDER — ONDANSETRON 4 MG PO TBDP
4.0000 mg | ORAL_TABLET | Freq: Once | ORAL | Status: AC | PRN
  Administered 2015-10-05: 4 mg via ORAL

## 2015-10-05 NOTE — ED Provider Notes (Signed)
CSN: [id number]     Arrival date & time 10/05/15  1606 History   First MD Initiated Contact with Patient 10/05/15 1936     Chief Complaint  Patient presents with  . Loss of Consciousness  . Diarrhea     (Consider location/radiation/quality/duration/timing/severity/associated sxs/prior Treatment) HPI  Pt presenting with c/o fatigue and syncope. She states that her blood pressure has been running low for the past several weeks. She takes amlodipine and lisinopril/hctz for HTN.  She states yesterday she began having some watery diarrhea- no blood or mucous.  Has had some mild nausea as well- no vomiting.  States she feels dizzy and lightheaded with standing which has been worsening today.  No chest pain no shortness of breath, no abdomial pain.  Has not had any fever.  Denies dysuria- she has baseline incontinence which she states is unchanged.  There are no other associated systemic symptoms, there are no other alleviating or modifying factors.   Past Medical History  Diagnosis Date  . Hypertension   . Diabetes mellitus   . CAD (coronary artery disease)   . DM (diabetes mellitus) ([place])   . HLD (hyperlipidemia)   . Chest pain   . Heart burn   . Fatigue   . Constipation   . Contusion of arm   . Joint stiffness of hand   . Urinary incontinence   . Vitamin D deficiency   . Anemia   . Arthritis   . Cataract, diabetic ([place])   . Diabetic retinopathy   . Glaucoma   . Hiatal hernia   . Esophageal stricture    Past Surgical History  Procedure Laterality Date  . Coronary angioplasty with stent placement  10/04  . Cardiac catheterization  10/04    s/p stent LV 45%  . Adenosine cardillite  05/16/05    negative signif. myocardial ischemic  . Cesarean section  85, 87    ectopic pregnancy  . Cholecystectomy  1998  . Cataract extraction      bilateral  . Partial hysterectomy     Family History  Problem Relation Age of Onset  . Heart disease Mother   . Heart attack Father   . Colon  cancer Neg Hx   . Diabetes    . Crohn's disease Brother   . Crohn's disease Brother   . Crohn's disease Brother    Social History  Substance Use Topics  . Smoking status: Former Smoker    Quit date: 03/31/1997  . Smokeless tobacco: Never Used  . Alcohol Use: No     Comment: ocassional   OB History    No data available     Review of Systems  ROS reviewed and all otherwise negative except for mentioned in HPI    Allergies  Penicillins; Lactose intolerance (gi); Milk-related compounds; and Other  Home Medications   Prior to Admission medications   Medication Sig Start Date End Date Taking? Authorizing Provider  amLODipine (NORVASC) 10 MG tablet Take 10 mg by mouth every morning.  02/06/15  Yes Historical Provider, MD  aspirin 81 MG EC tablet Take 81 mg by mouth every morning.    Yes Historical Provider, MD  Calcium Carbonate-Vitamin D (CALTRATE 600+D) 600-400 MG-UNIT per tablet Take 1 tablet by mouth daily.    Yes Historical Provider, MD  escitalopram (LEXAPRO) 20 MG tablet Take 20 mg by mouth every morning. 08/22/15  Yes Historical Provider, MD  gabapentin (NEURONTIN) 100 MG capsule Take 100 mg by mouth daily. 01/23/15  Yes Historical Provider, MD  lisinopril-hydrochlorothiazide (PRINZIDE,ZESTORETIC) 20-25 MG per tablet Take 1 tablet by mouth every morning.    Yes Historical Provider, MD  lovastatin (MEVACOR) 10 MG tablet Take 10 mg by mouth at bedtime.  01/16/14  Yes Historical Provider, MD  metFORMIN (GLUCOPHAGE) 500 MG tablet Take 500 mg by mouth daily with breakfast.     Yes Historical Provider, MD  Multiple Vitamin (MULTIVITAMIN) tablet Take 1 tablet by mouth daily.     Yes Historical Provider, MD  nitroGLYCERIN (NITROSTAT) 0.4 MG SL tablet Place 0.4 mg under the tongue every 5 (five) minutes as needed for chest pain (3 doses max).    Yes Historical Provider, MD  pentoxifylline (TRENTAL) 400 MG CR tablet Take 400 mg by mouth 2 (two) times daily. 02/06/15  Yes Historical  Provider, MD  senna (SENOKOT) 8.6 MG tablet Take 2 tablets by mouth daily as needed for constipation.   Yes Historical Provider, MD  triamcinolone (KENALOG) 0.1 % cream Apply 1 application topically 2 (two) times daily.    Yes Historical Provider, MD  polyethylene glycol powder (MIRALAX) powder Take 17 g by mouth daily as needed. Constipation    Historical Provider, MD   BP 121/75 mmHg  Pulse 69  Temp(Src) 98 F (36.7 C) (Oral)  Resp 18  SpO2 100%  Vitals reviewed Physical Exam  Physical Examination: General appearance - alert, well appearing, and in no distress Mental status - alert, oriented to person, place, and time Eyes - no conjunctival injection, no scleral icterus Mouth - mucous membranes moist, pharynx normal without lesions Neck - supple, no significant adenopathy Chest - clear to auscultation, no wheezes, rales or rhonchi, symmetric air entry Heart - normal rate, regular rhythm, normal S1, S2, no murmurs, rubs, clicks or gallops Abdomen - soft, nontender, nondistended, no masses or organomegaly Neurological - alert, oriented x 3, cranial nerves intact, strength 5/5 in extremities x 4, sensation intact Extremities - peripheral pulses normal, no pedal edema, no clubbing or cyanosis Skin - normal coloration and turgor, no rashes  ED Course  Procedures (including critical care time) Labs Review Labs Reviewed  BASIC METABOLIC PANEL - Abnormal; Notable for the following:    CO2 21 (*)    Glucose, Bld 104 (*)    Creatinine, Ser 1.36 (*)    GFR calc non Af Amer 38 (*)    GFR calc Af Amer 44 (*)    All other components within normal limits  CBC - Abnormal; Notable for the following:    Hemoglobin 11.7 (*)    HCT 35.1 (*)    All other components within normal limits  URINALYSIS, ROUTINE W REFLEX MICROSCOPIC (NOT AT [facility])  CBG MONITORING, ED  I-STAT TROPOININ, ED    Imaging Review No results found. I have personally reviewed and evaluated these images and lab results as  part of my medical decision-making.   EKG Interpretation   Date/Time:  Friday October 05 2015 16:17:08 EDT Ventricular Rate:  83 PR Interval:  156 QRS Duration: 74 QT Interval:  350 QTC Calculation: 411 R Axis:   -14 Text Interpretation:  Sinus rhythm with Fusion complexes Minimal voltage  criteria for LVH, may be normal variant Borderline ECG No significant  change since last tracing Confirmed by [facility]  MD, [place] [phone]) on  10/05/2015 8:08:12 PM      MDM   Final diagnoses:  Diarrhea, unspecified type  Near syncope  Dehydration    Pt presenting with c/o fatigue and near syncope  in the setting of diarrhea.  Pt with reassuring labs including urinalysis without UTI.  EKg reassuring- doubt ACS. Anemia is at her baseline.  Pt feels much improved after IV fluids.  She is requesting to be discharged.  Advised to hold her BP meds for the next 2 days.  Advised close f/u with PMD.    11:46 PM pt was able to ambulate to the bathroom- she and family feel that she has improved after IV fluids.  Her BP reamins stable.  She would much prefer to be discharged, does not wish to stay for admission.  Discharged with strict return precautions.  Pt agreeable with plan.  Alfonzo Beers, MD 10/05/15 [phone]

## 2015-10-05 NOTE — ED Notes (Signed)
Pt reports onset of diarrhea and fatigue yesterday. Having nausea now and reports passing out whenever she stands up.

## 2015-10-05 NOTE — Discharge Instructions (Signed)
Return to the ED with any concerns including chest pain, difficulty breathing, changes in vision or speech, weakness of arms or legs, abdominal pain, vomiting and not able to keep down liquids, decreased level of alertness/lethargy, or any other alarming symptoms

## 2015-10-12 ENCOUNTER — Encounter: Payer: Medicare HMO | Admitting: Vascular Surgery

## 2015-10-12 ENCOUNTER — Ambulatory Visit (HOSPITAL_COMMUNITY): Payer: Medicare HMO

## 2015-11-19 ENCOUNTER — Encounter: Payer: Self-pay | Admitting: Vascular Surgery

## 2015-11-22 ENCOUNTER — Ambulatory Visit (HOSPITAL_COMMUNITY)
Admission: RE | Admit: 2015-11-22 | Discharge: 2015-11-22 | Disposition: A | Discharge status: Home / Self Care | Payer: Medicare HMO | Source: Ambulatory Visit | Attending: Vascular Surgery | Admitting: Vascular Surgery

## 2015-11-22 ENCOUNTER — Ambulatory Visit (INDEPENDENT_AMBULATORY_CARE_PROVIDER_SITE_OTHER): Payer: Medicare HMO | Admitting: Vascular Surgery

## 2015-11-22 ENCOUNTER — Encounter: Payer: Self-pay | Admitting: Vascular Surgery

## 2015-11-22 VITALS — BP 112/76 | HR 69 | Temp 97.3°F | Resp 16 | Ht 61.0 in | Wt 141.0 lb

## 2015-11-22 DIAGNOSIS — Z87891 Personal history of nicotine dependence: Secondary | ICD-10-CM | POA: Diagnosis not present

## 2015-11-22 DIAGNOSIS — R202 Paresthesia of skin: Secondary | ICD-10-CM | POA: Diagnosis present

## 2015-11-22 DIAGNOSIS — M79604 Pain in right leg: Secondary | ICD-10-CM

## 2015-11-22 DIAGNOSIS — E119 Type 2 diabetes mellitus without complications: Secondary | ICD-10-CM | POA: Diagnosis not present

## 2015-11-22 DIAGNOSIS — I10 Essential (primary) hypertension: Secondary | ICD-10-CM | POA: Diagnosis not present

## 2015-11-22 DIAGNOSIS — E785 Hyperlipidemia, unspecified: Secondary | ICD-10-CM | POA: Insufficient documentation

## 2015-11-22 DIAGNOSIS — M79605 Pain in left leg: Secondary | ICD-10-CM

## 2015-11-22 LAB — VAS US LOWER EXTREMITY ARTERIAL DUPLEX
LEFT PERO DIST SYS: 27 cm/s
LPOPPPSV: 63 cm/s
LSFDPSV: -60 cm/s
LSFMPSV: -80 cm/s
Left ant tibial distal sys: 69 cm/s
Left popliteal dist sys PSV: -53 cm/s
Left super femoral prox sys PSV: -100 cm/s
RATIBDISTSYS: 54 cm/s
RPERPSV: 27 cm/s
RSFMPSV: -86 cm/s
RTIBDISTSYS: 62 cm/s
Right popliteal prox sys PSV: 60 cm/s
Right super femoral dist sys PSV: -65 cm/s
Right super femoral prox sys PSV: -92 cm/s
left post tibial dist sys: 62 cm/s

## 2015-11-22 NOTE — Progress Notes (Signed)
Vascular and Vein Specialist of [facility]  Patient name: Evelyn Morrison MRN: [id number] DOB: 1940/12/20 Sex: female  REASON FOR VISIT: Bilateral leg pain  HPI: Evelyn Morrison is a 75 y.o. female, who presents for evaluation of bilateral leg pain. Her legs have been bothering her for the last several months. She describes her leg pain mostly in the shin and calf areas bilaterally as a cramping sensation when she wakes up in the morning. This pain is also associated with performing tasks around the house including caring for her husband. The patient stays active throughout the day and has to stop and sit down because her legs "give out" and feel tired. She says rests makes it better. She has a long history of peripheral diabetic neuropathy. She does have a history of low back pain and shooting pains that radiate down her buttocks and legs bilaterally. She performs a lot of lifting of her husband at home. She denies any rest pain or nonhealing wounds. She also states that her husband's smoking bothers her legs as well. He recently quit smoking due to issues with CHF. She says that she was started on Pentoxifylline by her PCP.   She presented to the ER in May 2017 where she had a paronychia drained. She also recently had her left big toenail removed. Yesterday, she had a shave biopsy of a mole at the base of her left great toenail.  Her past medical history includes CAD and history of MI status post PCI in 2004. She is followed by Dr. Burt Knack. She is a former smoker quitting in 1999. She denies any current chest discomfort or shortness of breath. She has diabetes managed on metformin. She is on aspirin. She takes a statin for her hyper cholesterolemia. Her hypertension is medically managed.  Past Medical History:  Diagnosis Date  . Anemia   . Arthritis   . CAD (coronary artery disease)   . Cataract, diabetic ([place])   . Chest pain   . Constipation   . Contusion of arm   . Diabetes mellitus   .  Diabetic retinopathy   . DM (diabetes mellitus) ([place])   . Esophageal stricture   . Fatigue   . Glaucoma   . Heart burn   . Hiatal hernia   . HLD (hyperlipidemia)   . Hypertension   . Joint stiffness of hand   . Urinary incontinence   . Vitamin D deficiency     Family History  Problem Relation Age of Onset  . Heart disease Mother   . Heart attack Father   . Colon cancer Neg Hx   . Diabetes    . Crohn's disease Brother   . Crohn's disease Brother   . Crohn's disease Brother     SOCIAL HISTORY: Social History   Social History  . Marital status: Married    Spouse name: N/A  . Number of children: 5   . Years of education: N/A   Occupational History  . Social Worker     Retired   Social History Main Topics  . Smoking status: Former Smoker    Quit date: 03/31/1997  . Smokeless tobacco: Never Used  . Alcohol use No     Comment: ocassional  . Drug use: No  . Sexual activity: Not on file   Other Topics Concern  . Not on file   Social History Narrative   2 caffeine drinks daily     Allergies  Allergen Reactions  . Penicillins Hives and Palpitations  Foam at the mouth, Has patient had a PCN reaction causing immediate rash, facial/tongue/throat swelling, SOB or lightheadedness with hypotension: Yes Has patient had a PCN reaction causing severe rash involving mucus membranes or skin necrosis: No Has patient had a PCN reaction that required hospitalization No Has patient had a PCN reaction occurring within the last 10 years: No If all of the above answers are "NO", then may proceed with Cephalosporin use.   . Lactose Intolerance (Gi) Other (See Comments)    Stomach pain  . Milk-Related Compounds     REACTION: Stomach-ache  . Other Other (See Comments)    Can't take cough medicine (not sure which one), causes shakes, can take coricidin    Current Outpatient Prescriptions  Medication Sig Dispense Refill  . amLODipine (NORVASC) 10 MG tablet Take 10 mg by mouth  every morning.     Marland Kitchen aspirin 81 MG EC tablet Take 81 mg by mouth every morning.     . Calcium Carbonate-Vitamin D (CALTRATE 600+D) 600-400 MG-UNIT per tablet Take 1 tablet by mouth daily.     Marland Kitchen escitalopram (LEXAPRO) 20 MG tablet Take 20 mg by mouth every morning.  2  . gabapentin (NEURONTIN) 100 MG capsule Take 100 mg by mouth daily.  2  . lisinopril-hydrochlorothiazide (PRINZIDE,ZESTORETIC) 20-25 MG per tablet Take 1 tablet by mouth every morning.     . lovastatin (MEVACOR) 10 MG tablet Take 10 mg by mouth at bedtime.     . metFORMIN (GLUCOPHAGE) 500 MG tablet Take 500 mg by mouth daily with breakfast.      . Multiple Vitamin (MULTIVITAMIN) tablet Take 1 tablet by mouth daily.      . nitroGLYCERIN (NITROSTAT) 0.4 MG SL tablet Place 0.4 mg under the tongue every 5 (five) minutes as needed for chest pain (3 doses max).     . pentoxifylline (TRENTAL) 400 MG CR tablet Take 400 mg by mouth 2 (two) times daily.    . polyethylene glycol powder (MIRALAX) powder Take 17 g by mouth daily as needed. Constipation    . senna (SENOKOT) 8.6 MG tablet Take 2 tablets by mouth daily as needed for constipation.    . triamcinolone (KENALOG) 0.1 % cream Apply 1 application topically 2 (two) times daily.      No current facility-administered medications for this visit.     REVIEW OF SYSTEMS:  [X]  denotes positive finding, [ ]  denotes negative finding Cardiac  Comments:  Chest pain or chest pressure: x   Shortness of breath upon exertion:    Short of breath when lying flat:    Irregular heart rhythm:        Vascular    Pain in calf, thigh, or hip brought on by ambulation: x   Pain in feet at night that wakes you up from your sleep:  x   Blood clot in your veins:    Leg swelling:  x       Pulmonary    Oxygen at home:    Productive cough:     Wheezing:         Neurologic    Sudden weakness in arms or legs:     Sudden numbness in arms or legs:     Sudden onset of difficulty speaking or slurred  speech:    Temporary loss of vision in one eye:     Problems with dizziness:         Gastrointestinal    Blood in stool:  Vomited blood:         Genitourinary    Burning when urinating:     Blood in urine:        Psychiatric    Major depression:         Hematologic    Bleeding problems:    Problems with blood clotting too easily:        Skin    Rashes or ulcers:        Constitutional    Fever or chills:      PHYSICAL EXAM: Vitals:   11/22/15 1420  BP: 112/76  Pulse: 69  Resp: 16  Temp: 97.3 F (36.3 C)  TempSrc: Oral  SpO2: 100%  Weight: 141 lb (64 kg)  Height: 5\' 1"  (1.549 m)    GENERAL: The patient is a well-nourished female, in no acute distress. The vital signs are documented above. CARDIAC: There is a regular rate and rhythm. No carotid bruits.  VASCULAR: 2+ radial pulses bilaterally. 2+ femoral pulses bilaterally. Nonpalpable popliteal pulses. 2+ dorsalis pedis pulses bilaterally.  PULMONARY: There is good air exchange bilaterally without wheezing or rales. ABDOMEN: Soft and non-tender. No masses palpated.  MUSCULOSKELETAL:  left great toe now removed.  NEUROLOGIC: No focal weakness or paresthesias are detected. SKIN: There are no ulcers or rashes noted. PSYCHIATRIC: The patient has a normal affect.  DATA: ABIs 11/22/2015  Right: 1.12 with triphasic waveforms Left: 1.14 with triphasic waveforms  MEDICAL ISSUES: Low back pain Bilateral leg pain and paresthesias Peripheral neuropathy  The patient has normal arterial function on noninvasive studies and by physical exam. Her ABIs from November 2016 were also normal with triphasic waveforms. Believe that her symptoms are related to her neuropathy and also back pain. She does perform a lot of heavy lifting at home caring for her husband. Advised her to use her legs when lifting rather than her back. She has been previously on gabapentin in the past. Recommended her to see her PCP regarding restarting  gabapentin and also to work up her back pain if it does not improve. She will follow up prn.    Virgina Jock, PA-C [facility] [place]  History and exam findings as above. Patient has normal ABIs palpable pulses in her feet. Most of her symptoms seemed to be neurologic in origin. She will follow-up with [place] on an as-needed basis.  Ruta Hinds, MD [facility] [place] Office: [phone] Pager: [phone]

## 2016-02-12 DIAGNOSIS — Z1231 Encounter for screening mammogram for malignant neoplasm of breast: Secondary | ICD-10-CM | POA: Diagnosis not present

## 2016-02-25 ENCOUNTER — Encounter: Payer: Self-pay | Admitting: Physician Assistant

## 2016-02-26 ENCOUNTER — Encounter: Payer: Self-pay | Admitting: Physician Assistant

## 2016-02-26 ENCOUNTER — Ambulatory Visit (INDEPENDENT_AMBULATORY_CARE_PROVIDER_SITE_OTHER): Payer: Commercial Managed Care - HMO | Admitting: Physician Assistant

## 2016-02-26 VITALS — BP 102/70 | HR 90 | Ht 61.0 in | Wt 148.0 lb

## 2016-02-26 DIAGNOSIS — M79605 Pain in left leg: Secondary | ICD-10-CM

## 2016-02-26 DIAGNOSIS — I10 Essential (primary) hypertension: Secondary | ICD-10-CM | POA: Diagnosis not present

## 2016-02-26 DIAGNOSIS — M79604 Pain in right leg: Secondary | ICD-10-CM

## 2016-02-26 DIAGNOSIS — R072 Precordial pain: Secondary | ICD-10-CM

## 2016-02-26 DIAGNOSIS — I2510 Atherosclerotic heart disease of native coronary artery without angina pectoris: Secondary | ICD-10-CM

## 2016-02-26 DIAGNOSIS — E7800 Pure hypercholesterolemia, unspecified: Secondary | ICD-10-CM

## 2016-02-26 NOTE — Patient Instructions (Addendum)
Medication Instructions:  No change.  See your medication list.  Labwork: To be drawn at the time of your stress test:  Fasting CMET, Lipid panel  Testing/Procedures: Schedule a stress test called a Lexiscan Myoview.  Follow-Up: Dr. Sherren Mocha in 1 year.  Any Other Special Instructions Will Be Listed Below (If Applicable).  If you need a refill on your cardiac medications before your next appointment, please call your pharmacy.

## 2016-02-26 NOTE — Progress Notes (Signed)
Cardiology Office Note:    Date:  02/26/2016   ID:  [name], DOB 1940-04-01, MRN [id number]  PCP:  Benito Mccreedy, MD  Cardiologist:  Dr. Sherren Mocha   Electrophysiologist:  n/a  Referring MD: Benito Mccreedy, MD   Chief Complaint  Patient presents with  . Follow-up    CAD    History of Present Illness:    Evelyn Morrison is a 75 y.o. female with a hx of CAD s/p MI in 2004 tx with PCI at outside hospital.  She had a nuclear stress test in 2012 showing an ejection fraction of 59% with a small apical perfusion defect question ischemia versus shifting breast attenuation artifact. The study was interpreted as "low risk." Medical therapy was recommended. A stress echocardiogram was done in 2015 to evaluate shortness of breath. This showed normal LV function at rest with no ischemic changes during exertion.  Last seen by Dr. Sherren Mocha in 11/16.  She returns for annual Cardiology follow up.    She is here alone. Unfortunately, her husband was taken to the hospital by EMS shortly before coming here to see me today. He apparently has recurrent syncope. 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 She has a follow-up procedure tomorrow. She does have occasional chest discomfort. She tells me that she has taken nitroglycerin twice since she was last seen. She really does not have much exertional chest pain. She seems to have more chest discomfort when she is under stress. She had some earlier today when her husband was taken to the hospital. She does have some mild dyspnea with more extreme activities. She denies PND. She sleeps on 2 pillows chronically. She denies LE edema. She denies syncope.  Prior CV studies that were reviewed today include:    ABIs 8/17 (done at [facility]) Normal  ABIs 11/16 No evidence of segmental lower extremity arterial disease at rest, bilaterally. Normal ABI's, bilaterally. Normal great toe-brachial indices,  bilaterally.  ETT-Echo 12/15 EF 55.  No ECG changes.  Normal increase in contractility in all segments. Normal stress echo with poor exercise tolerance.   Myoview 2/12 Overall Impression: Small reversible apical perfusion defect.  This could represent ischemia (versus shifting breast attenuation).  EF 59.  Low risk study.   Past Medical History:  Diagnosis Date  . Anemia   . Arthritis   . CAD (coronary artery disease)   . Cataract, diabetic ([place])   . Chest pain   . Constipation   . Contusion of arm   . Diabetes mellitus   . Diabetic retinopathy   . DM (diabetes mellitus) ([place])   . Esophageal stricture   . Fatigue   . Glaucoma   . Heart burn   . Hiatal hernia   . HLD (hyperlipidemia)   . Hypertension   . Joint stiffness of hand   . Urinary incontinence   . Vitamin D deficiency     Past Surgical History:  Procedure Laterality Date  . adenosine cardillite  05/16/05   negative signif. myocardial ischemic  . CARDIAC CATHETERIZATION  10/04   s/p stent LV 45%  . CATARACT EXTRACTION     bilateral  . CESAREAN SECTION  84, 86   ectopic pregnancy  . CHOLECYSTECTOMY  1998  . CORONARY ANGIOPLASTY WITH STENT PLACEMENT  10/04  . PARTIAL HYSTERECTOMY      Current Medications: Current Meds  Medication Sig  . amLODipine (NORVASC) 10 MG tablet Take 10 mg by mouth every  morning.   Marland Kitchen aspirin 81 MG EC tablet Take 81 mg by mouth every morning.   . Calcium Carbonate-Vitamin D (CALTRATE 600+D) 600-400 MG-UNIT per tablet Take 1 tablet by mouth daily.   Marland Kitchen gabapentin (NEURONTIN) 100 MG capsule Take 100 mg by mouth daily.  Marland Kitchen lisinopril-hydrochlorothiazide (PRINZIDE,ZESTORETIC) 20-25 MG per tablet Take 1 tablet by mouth every morning.   . lovastatin (MEVACOR) 10 MG tablet Take 10 mg by mouth at bedtime.   . metFORMIN (GLUCOPHAGE) 500 MG tablet Take 500 mg by mouth daily with breakfast.    . Multiple Vitamin (MULTIVITAMIN) tablet Take 1 tablet by mouth daily.    . nitroGLYCERIN (NITROSTAT)  0.4 MG SL tablet Place 0.4 mg under the tongue every 5 (five) minutes as needed for chest pain (3 doses max).   . pentoxifylline (TRENTAL) 400 MG CR tablet Take 400 mg by mouth 2 (two) times daily.  . polyethylene glycol powder (MIRALAX) powder Take 17 g by mouth daily as needed. Constipation  . senna (SENOKOT) 8.6 MG tablet Take 2 tablets by mouth daily as needed for constipation.     Allergies:   Penicillins; Lactose intolerance (gi); Milk-related compounds; and Other   Social History   Social History  . Marital status: Married    Spouse name: N/A  . Number of children: 5   . Years of education: N/A   Occupational History  . Social Worker     Retired   Social History Main Topics  . Smoking status: Former Smoker    Quit date: 03/31/1997  . Smokeless tobacco: Never Used  . Alcohol use No     Comment: ocassional  . Drug use: No  . Sexual activity: Not Asked   Other Topics Concern  . None   Social History Narrative   2 caffeine drinks daily      Family History:  The patient's family history includes Crohn's disease in her brother, brother, and brother; Heart attack in her father; Heart disease in her mother.   ROS:   Please see the history of present illness.    Review of Systems  Constitution: Positive for decreased appetite and malaise/fatigue.  Cardiovascular: Positive for leg swelling.  Musculoskeletal: Positive for joint pain.   All other systems reviewed and are negative.   EKGs/Labs/Other Test Reviewed:    EKG:  EKG is  ordered today.  The ekg ordered today demonstrates NSR, HR 94, leftward axis, QTC 425 ms, no significant change compared to prior tracings  Recent Labs: 10/05/2015: BUN 16; Creatinine, Ser 1.36; Hemoglobin 11.7; Platelets 198; Potassium 3.9; Sodium 137   Recent Lipid Panel    Component Value Date/Time   CHOL 165 05/29/2010 2029   TRIG 46 05/29/2010 2029   HDL 76 05/29/2010 2029   CHOLHDL 2.2 Ratio 05/29/2010 2029   VLDL 9 05/29/2010 2029     LDLCALC 80 05/29/2010 2029     Physical Exam:    VS:  BP 102/70   Pulse 90   Ht 5\' 1"  (1.549 m)   Wt 148 lb (67.1 kg)   BMI 27.96 kg/m     Wt Readings from Last 3 Encounters:  02/26/16 148 lb (67.1 kg)  11/22/15 141 lb (64 kg)  02/13/15 139 lb 6.4 oz (63.2 kg)     Physical Exam  Constitutional: She is oriented to person, place, and time. She appears well-developed and well-nourished. No distress.  HENT:  Head: Normocephalic and atraumatic.  Eyes: No scleral icterus.  Neck: No JVD present.  Cardiovascular: Normal rate and regular rhythm.   No murmur heard. Pulmonary/Chest: Effort normal. She has no wheezes. She has no rales.  Abdominal: Soft. There is no tenderness.  Musculoskeletal: She exhibits no edema.  Neurological: She is alert and oriented to person, place, and time.  Skin: Skin is warm and dry.  Psychiatric: She has a normal mood and affect.    ASSESSMENT:    1. Precordial pain   2. Coronary artery disease involving native coronary artery of native heart without angina pectoris   3. Pure hypercholesterolemia   4. Essential hypertension   5. Pain in both lower extremities    PLAN:    In order of problems listed above:  1. Chest pain - She has fairly atypical symptoms. However, she has taken nitroglycerin. She has not had an assessment for ischemia in 2 years. With the recent excision of melanoma from her feet, she is unable to walk on a treadmill. I will arrange Lexiscan Myoview to rule out significant ischemia.  2. CAD - s/p MI tx with PCI in 2004.  ETT-Echo in 2015 was normal.  Proceed with Lexiscan Myoview as noted above. Follow-up 1 year, sooner if stress test abnormal. Continue aspirin, statin.  3. HL - Continue statin. She has not had her lipids checked in quite some time. Arrange fasting CMET, lipids.  4. HTN - Blood pressure is controlled on current regimen.  5. Leg pain - She has had 2 sets of ABIs that were normal.  I'm not sure why she is on  Pentoxifylline.  She can stop this for now.  If she has worsening leg pain without it, she can resume and FU with her PCP.   Medication Adjustments/Labs and Tests Ordered: Current medicines are reviewed at length with the patient today.  Concerns regarding medicines are outlined above.  Medication changes, Labs and Tests ordered today are outlined in the Patient Instructions noted below. Patient Instructions  Medication Instructions:  No change.  See your medication list.  Labwork: To be drawn at the time of your stress test:  Fasting CMET, Lipid panel  Testing/Procedures: Schedule a stress test called a Lexiscan Myoview.  Follow-Up: Dr. Sherren Mocha in 1 year.  Any Other Special Instructions Will Be Listed Below (If Applicable).  If you need a refill on your cardiac medications before your next appointment, please call your pharmacy.   Signed, Richardson Dopp, PA-C  02/26/2016 3:28 PM    [facility] [address], [place], [place]  [address] Phone: [phone]; Fax: [phone]

## 2016-03-05 ENCOUNTER — Telehealth (HOSPITAL_COMMUNITY): Payer: Self-pay | Admitting: *Deleted

## 2016-03-05 NOTE — Telephone Encounter (Signed)
Patient given detailed instructions per Myocardial Perfusion Study Information Sheet for the test on 03/10/16. Patient notified to arrive 15 minutes early and that it is imperative to arrive on time for appointment to keep from having the test rescheduled.  If you need to cancel or reschedule your appointment, please call the office within 24 hours of your appointment. Failure to do so may result in a cancellation of your appointment, and a $50 no show fee. Patient verbalized understanding. Kirstie Peri

## 2016-03-10 ENCOUNTER — Ambulatory Visit (HOSPITAL_COMMUNITY): Payer: Commercial Managed Care - HMO | Attending: Physician Assistant

## 2016-03-10 ENCOUNTER — Other Ambulatory Visit: Payer: Commercial Managed Care - HMO | Admitting: *Deleted

## 2016-03-10 ENCOUNTER — Telehealth: Payer: Self-pay | Admitting: *Deleted

## 2016-03-10 ENCOUNTER — Encounter: Payer: Self-pay | Admitting: Physician Assistant

## 2016-03-10 DIAGNOSIS — R072 Precordial pain: Secondary | ICD-10-CM

## 2016-03-10 DIAGNOSIS — I10 Essential (primary) hypertension: Secondary | ICD-10-CM | POA: Diagnosis not present

## 2016-03-10 DIAGNOSIS — R079 Chest pain, unspecified: Secondary | ICD-10-CM | POA: Diagnosis not present

## 2016-03-10 DIAGNOSIS — E7800 Pure hypercholesterolemia, unspecified: Secondary | ICD-10-CM

## 2016-03-10 DIAGNOSIS — I2510 Atherosclerotic heart disease of native coronary artery without angina pectoris: Secondary | ICD-10-CM | POA: Diagnosis not present

## 2016-03-10 DIAGNOSIS — R9439 Abnormal result of other cardiovascular function study: Secondary | ICD-10-CM | POA: Insufficient documentation

## 2016-03-10 DIAGNOSIS — E119 Type 2 diabetes mellitus without complications: Secondary | ICD-10-CM | POA: Diagnosis not present

## 2016-03-10 LAB — COMPREHENSIVE METABOLIC PANEL
ALBUMIN: 4.1 g/dL (ref 3.6–5.1)
ALK PHOS: 54 U/L (ref 33–130)
ALT: 25 U/L (ref 6–29)
AST: 38 U/L — ABNORMAL HIGH (ref 10–35)
BUN: 17 mg/dL (ref 7–25)
CALCIUM: 9.7 mg/dL (ref 8.6–10.4)
CO2: 19 mmol/L — AB (ref 20–31)
Chloride: 106 mmol/L (ref 98–110)
Creat: 1.2 mg/dL — ABNORMAL HIGH (ref 0.60–0.93)
GLUCOSE: 93 mg/dL (ref 65–99)
POTASSIUM: 4.7 mmol/L (ref 3.5–5.3)
Sodium: 139 mmol/L (ref 135–146)
Total Bilirubin: 0.5 mg/dL (ref 0.2–1.2)
Total Protein: 7.2 g/dL (ref 6.1–8.1)

## 2016-03-10 LAB — MYOCARDIAL PERFUSION IMAGING
CHL CUP NUCLEAR SDS: 8
CHL CUP NUCLEAR SSS: 13
CSEPPHR: 92 {beats}/min
LHR: 0.31
LV dias vol: 56 mL (ref 46–106)
LV sys vol: 19 mL
Rest HR: 73 {beats}/min
SRS: 5
TID: 0.91

## 2016-03-10 LAB — LIPID PANEL
CHOL/HDL RATIO: 2.3 ratio (ref <5.0)
Cholesterol: 183 mg/dL (ref <200)
HDL: 81 mg/dL (ref >50)
LDL CALC: 77 mg/dL (ref <100)
TRIGLYCERIDES: 127 mg/dL (ref <150)
VLDL: 25 mg/dL (ref <30)

## 2016-03-10 MED ORDER — TECHNETIUM TC 99M TETROFOSMIN IV KIT
10.4000 | PACK | Freq: Once | INTRAVENOUS | Status: AC | PRN
  Administered 2016-03-10: 10.4 via INTRAVENOUS
  Filled 2016-03-10: qty 11

## 2016-03-10 MED ORDER — REGADENOSON 0.4 MG/5ML IV SOLN
0.4000 mg | Freq: Once | INTRAVENOUS | Status: AC
  Administered 2016-03-10: 0.4 mg via INTRAVENOUS

## 2016-03-10 MED ORDER — TECHNETIUM TC 99M TETROFOSMIN IV KIT
32.2000 | PACK | Freq: Once | INTRAVENOUS | Status: AC | PRN
  Administered 2016-03-10: 32.2 via INTRAVENOUS
  Filled 2016-03-10: qty 33

## 2016-03-10 NOTE — Telephone Encounter (Signed)
Tried to call pt to go over Myoview results though no answer on the home # and vm not set up on cell #.

## 2016-03-10 NOTE — Telephone Encounter (Signed)
Pt notified of Myoview results by phone with verbal understanding.

## 2016-03-11 ENCOUNTER — Telehealth: Payer: Self-pay | Admitting: *Deleted

## 2016-03-11 NOTE — Telephone Encounter (Signed)
Pt notified of lab results by phone with verbal understanding.  

## 2016-03-21 DIAGNOSIS — E785 Hyperlipidemia, unspecified: Secondary | ICD-10-CM | POA: Diagnosis not present

## 2016-03-21 DIAGNOSIS — M2550 Pain in unspecified joint: Secondary | ICD-10-CM | POA: Diagnosis not present

## 2016-03-21 DIAGNOSIS — I25118 Atherosclerotic heart disease of native coronary artery with other forms of angina pectoris: Secondary | ICD-10-CM | POA: Diagnosis not present

## 2016-03-21 DIAGNOSIS — N289 Disorder of kidney and ureter, unspecified: Secondary | ICD-10-CM | POA: Diagnosis not present

## 2016-03-21 DIAGNOSIS — G629 Polyneuropathy, unspecified: Secondary | ICD-10-CM | POA: Diagnosis not present

## 2016-03-21 DIAGNOSIS — I10 Essential (primary) hypertension: Secondary | ICD-10-CM | POA: Diagnosis not present

## 2016-03-21 DIAGNOSIS — Z0000 Encounter for general adult medical examination without abnormal findings: Secondary | ICD-10-CM | POA: Diagnosis not present

## 2016-03-21 DIAGNOSIS — F339 Major depressive disorder, recurrent, unspecified: Secondary | ICD-10-CM | POA: Diagnosis not present

## 2016-03-21 DIAGNOSIS — E119 Type 2 diabetes mellitus without complications: Secondary | ICD-10-CM | POA: Diagnosis not present

## 2017-04-24 ENCOUNTER — Ambulatory Visit: Payer: Medicare HMO | Admitting: Physician Assistant

## 2017-04-24 ENCOUNTER — Encounter: Payer: Self-pay | Admitting: Physician Assistant

## 2017-04-24 VITALS — BP 122/70 | HR 74 | Ht 61.0 in | Wt 151.2 lb

## 2017-04-24 DIAGNOSIS — R0789 Other chest pain: Secondary | ICD-10-CM | POA: Diagnosis not present

## 2017-04-24 DIAGNOSIS — I10 Essential (primary) hypertension: Secondary | ICD-10-CM

## 2017-04-24 DIAGNOSIS — E119 Type 2 diabetes mellitus without complications: Secondary | ICD-10-CM

## 2017-04-24 DIAGNOSIS — I2510 Atherosclerotic heart disease of native coronary artery without angina pectoris: Secondary | ICD-10-CM

## 2017-04-24 DIAGNOSIS — R0602 Shortness of breath: Secondary | ICD-10-CM | POA: Diagnosis not present

## 2017-04-24 NOTE — Progress Notes (Signed)
Cardiology Office Note:    Date:  04/24/2017   ID:  Evelyn Morrison, DOB 25-Nov-1940, MRN [id number]  PCP:  Benito Mccreedy, MD  Cardiologist:  Sherren Mocha, MD   Referring MD: Benito Mccreedy, MD   Chief Complaint  Patient presents with  . Chest Pain  . Coronary Artery Disease    Follow-up    History of Present Illness:    Evelyn Morrison is a 77 y.o. female with a hx of CAD s/p MI in 2004 tx with PCI at outside hospital.  She had a nuclear stress test in 2012 showing an ejection fraction of 59% with a small apical perfusion defect question ischemia versus shifting breast attenuation artifact. The study was interpreted as "low risk." Medical therapy was recommended. A stress echocardiogram was done in 2015 to evaluate shortness of breath. This showed normal LV function at rest with no ischemic changes during exertion.  She was last seen in November 2017.  She had some chest discomfort at that time and a follow-up Lexiscan Myoview was low risk and negative for ischemia.  Evelyn Morrison returns for follow-up.  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  She is under quite a bit of stress.  Over the past 4-5 months, she has noted some chest discomfort.  This happens with activities.  She does not feel that it is getting any worse.  She has noted symptoms at times that she thinks are similar to her previous angina.  She also notes shortness of breath with activity.  This does not seem to be getting any worse.  She does awaken at times out of breath.  She has noticed some ankle edema.  She denies syncope.  She denies any bleeding issues.  She is not certain if she snores.  Prior CV studies:   The following studies were reviewed today:  Nuclear stress test 03/10/16 EF 66, attenuation artifact, but no ischemia, low risk  ABIs 8/17 (done at [facility]) Normal  ABIs 11/16 No evidence of segmental lower extremity arterial disease at rest, bilaterally. Normal  ABI's, bilaterally. Normal great toe-brachial indices, bilaterally.  ETT-Echo 12/15 EF 55.  No ECG changes.  Normal increase in contractility in all segments. Normal stress echo with poor exercise tolerance.   Myoview 2/12 Overall Impression: Small reversible apical perfusion defect. This could represent ischemia (versus shifting breast attenuation). EF 59.  Low risk study'  Past Medical History:  Diagnosis Date  . Anemia   . Arthritis   . CAD (coronary artery disease)    Myoview 12/17: EF 66, apical and apical lateral defect - likely attenuation artifact, no ischemia; Low Risk  . Cataract, diabetic ([place])   . Chest pain   . Constipation   . Contusion of arm   . Diabetes mellitus   . Diabetic retinopathy   . DM (diabetes mellitus) ([place])   . Esophageal stricture   . Fatigue   . Glaucoma   . Heart burn   . Hiatal hernia   . HLD (hyperlipidemia)   . Hypertension   . Joint stiffness of hand   . Urinary incontinence   . Vitamin D deficiency     Past Surgical History:  Procedure Laterality Date  . adenosine cardillite  05/16/05   negative signif. myocardial ischemic  . CARDIAC CATHETERIZATION  10/04   s/p stent LV 45%  . CATARACT EXTRACTION     bilateral  . CESAREAN SECTION  84, 86   ectopic pregnancy  . CHOLECYSTECTOMY  [place] WITH STENT PLACEMENT  10/04  . PARTIAL HYSTERECTOMY      Current Medications: Current Meds  Medication Sig  . amLODipine (NORVASC) 10 MG tablet Take 10 mg by mouth every morning.   Marland Kitchen aspirin 81 MG EC tablet Take 81 mg by mouth every morning.   . Calcium Carbonate-Vitamin D (CALTRATE 600+D) 600-400 MG-UNIT per tablet Take 1 tablet by mouth daily.   Marland Kitchen escitalopram (LEXAPRO) 20 MG tablet Take 20 mg by mouth daily.  Marland Kitchen gabapentin (NEURONTIN) 100 MG capsule Take 100 mg by mouth daily.  Marland Kitchen lisinopril-hydrochlorothiazide (PRINZIDE,ZESTORETIC) 20-25 MG per tablet Take 1 tablet by mouth every morning.   . lovastatin (MEVACOR)  10 MG tablet Take 10 mg by mouth at bedtime.   . meloxicam (MOBIC) 15 MG tablet Take 15 mg by mouth daily.  . metFORMIN (GLUCOPHAGE) 500 MG tablet Take 500 mg by mouth daily with breakfast.    . Multiple Vitamin (MULTIVITAMIN) tablet Take 1 tablet by mouth daily.    . nitroGLYCERIN (NITROSTAT) 0.4 MG SL tablet Place 0.4 mg under the tongue every 5 (five) minutes as needed for chest pain (3 doses max).   . polyethylene glycol powder (MIRALAX) powder Take 17 g by mouth daily as needed. Constipation  . senna (SENOKOT) 8.6 MG tablet Take 2 tablets by mouth daily as needed for constipation.  . [DISCONTINUED] gabapentin (NEURONTIN) 300 MG capsule Take 300 mg by mouth at bedtime.     Allergies:   Penicillins; Lactose intolerance (gi); Milk-related compounds; and Other   Social History   Tobacco Use  . Smoking status: Former Smoker    Last attempt to quit: 03/31/1997    Years since quitting: 20.0  . Smokeless tobacco: Never Used  Substance Use Topics  . Alcohol use: No    Comment: ocassional  . Drug use: No     Family Hx: The patient's family history includes Crohn's disease in her brother, brother, and brother; Diabetes in her unknown relative; Heart attack in her father; Heart disease in her mother. There is no history of Colon cancer.  ROS:   Please see the history of present illness.    Review of Systems  Constitution: Positive for malaise/fatigue.  Cardiovascular: Positive for dyspnea on exertion and leg swelling.  Respiratory: Positive for cough.   Skin: Positive for rash.  Musculoskeletal: Positive for back pain, joint pain, joint swelling and myalgias.  Gastrointestinal: Positive for constipation.   All other systems reviewed and are negative.   EKGs/Labs/Other Test Reviewed:    EKG:  EKG is  ordered today.  The ekg ordered today demonstrates normal sinus rhythm, HR 72, leftward axis, QTC 422 ms, no change from prior tracing  Recent Labs: No results found for requested labs  within last 8760 hours.   Recent Lipid Panel Lab Results  Component Value Date/Time   CHOL 183 03/10/2016 10:08 AM   TRIG 127 03/10/2016 10:08 AM   HDL 81 03/10/2016 10:08 AM   CHOLHDL 2.3 03/10/2016 10:08 AM   LDLCALC 77 03/10/2016 10:08 AM    Physical Exam:    VS:  BP 122/70   Pulse 74   Ht 5\' 1"  (1.549 m)   Wt 151 lb 3.2 oz (68.6 kg)   SpO2 93%   BMI 28.57 kg/m     Wt Readings from Last 3 Encounters:  04/24/17 151 lb 3.2 oz (68.6 kg)  03/10/16 148 lb (67.1 kg)  02/26/16 148 lb (67.1 kg)  Physical Exam  ASSESSMENT:    1. Other chest pain   2. Shortness of breath   3. Coronary artery disease involving native coronary artery of native heart without angina pectoris   4. Essential hypertension   5. Type 2 diabetes mellitus without complication, without long-term current use of insulin (HCC)    PLAN:    In order of problems listed above:  1.  Other chest pain She has had some chest discomfort recently.  This is somewhat atypical for angina.  However, she has noted some symptoms at times that she questions if they are similar to her previous angina.  Her ECG is unchanged.  She had a stress test over a year ago that was negative for ischemia.  Overall, I suspect this is related to stress.  However, she is at risk for progressive coronary disease given her diabetes.  I have recommended proceeding with a Lexiscan Myoview to rule out ischemia.  2.  Shortness of breath She has a prior history of smoking.  She notes dyspnea on exertion over the past several months.  She also describes questionable PND.  She does not appear volume overloaded on exam.  I will obtain a BMET, CBC, BNP.  I will also obtain echocardiogram.  3.  Coronary artery disease involving native coronary artery of native heart without angina pectoris Remote history of myocardial infarction in 2004 treated with PCI.  Low risk Myoview in November 2017.  As noted, she will undergo nuclear stress testing given  recent history of chest pain.Continue current medications and follow up as planned.  Aspirin, statin.  Arrange follow-up lipids and LFTs.  4.  Essential hypertension The patient's blood pressure is controlled on her current regimen.  Continue current therapy.   5.  Type 2 diabetes mellitus without complication, without long-term current use of insulin ([place]) Continue follow-up with primary care.  Consideration could be given towards starting empagliflozin given recent evidence of cardiovascular benefit.   Dispo:  Return in about 6 months (around 10/22/2017) for Routine Follow Up, w/ Dr. Burt Knack, or Richardson Dopp, PA-C.   Medication Adjustments/Labs and Tests Ordered: Current medicines are reviewed at length with the patient today.  Concerns regarding medicines are outlined above.  Tests Ordered: Orders Placed This Encounter  Procedures  . Basic Metabolic Panel (BMET)  . CBC  . Hepatic function panel  . Pro b natriuretic peptide  . Lipid Profile  . Myocardial Perfusion Imaging  . EKG 12-Lead  . ECHOCARDIOGRAM COMPLETE   Medication Changes: No orders of the defined types were placed in this encounter.   Signed, Richardson Dopp, PA-C  04/24/2017 2:29 PM    [facility] [address], [place], [place]  [address] Phone: [phone]; Fax: [phone]

## 2017-04-24 NOTE — Patient Instructions (Signed)
Medication Instructions:  1. Your physician recommends that you continue on your current medications as directed. Please refer to the Current Medication list given to you today.   Labwork: TODAY BMET, CBC, LIPID, LFT, PRO BNP  Testing/Procedures: 1. Your physician has requested that you have an echocardiogram. Echocardiography is a painless test that uses sound waves to create images of your heart. It provides your doctor with information about the size and shape of your heart and how well your heart's chambers and valves are working. This procedure takes approximately one hour. There are no restrictions for this procedure.  2. Your physician has requested that you have a lexiscan myoview. For further information please visit [email or web address]. Please follow instruction sheet, as given.    Follow-Up: Your physician wants you to follow-up in: [place], [facility]  You will receive a reminder letter in the mail two months in advance. If you don't receive a letter, please call our office to schedule the follow-up appointment.   Any Other Special Instructions Will Be Listed Below (If Applicable).     If you need a refill on your cardiac medications before your next appointment, please call your pharmacy.

## 2017-04-25 LAB — BASIC METABOLIC PANEL
BUN/Creatinine Ratio: 16 (ref 12–28)
BUN: 24 mg/dL (ref 8–27)
CALCIUM: 9.5 mg/dL (ref 8.7–10.3)
CHLORIDE: 105 mmol/L (ref 96–106)
CO2: 22 mmol/L (ref 20–29)
Creatinine, Ser: 1.47 mg/dL — ABNORMAL HIGH (ref 0.57–1.00)
GFR calc non Af Amer: 35 mL/min/{1.73_m2} — ABNORMAL LOW (ref >59)
GFR, EST AFRICAN AMERICAN: 40 mL/min/{1.73_m2} — AB (ref >59)
Glucose: 102 mg/dL — ABNORMAL HIGH (ref 65–99)
Potassium: 4.2 mmol/L (ref 3.5–5.2)
Sodium: 142 mmol/L (ref 134–144)

## 2017-04-25 LAB — HEPATIC FUNCTION PANEL
ALBUMIN: 4.2 g/dL (ref 3.5–4.8)
ALT: 17 IU/L (ref 0–32)
AST: 20 IU/L (ref 0–40)
Alkaline Phosphatase: 71 IU/L (ref 39–117)
BILIRUBIN, DIRECT: 0.07 mg/dL (ref 0.00–0.40)
Total Protein: 7.3 g/dL (ref 6.0–8.5)

## 2017-04-25 LAB — LIPID PANEL
Chol/HDL Ratio: 2.2 ratio (ref 0.0–4.4)
Cholesterol, Total: 193 mg/dL (ref 100–199)
HDL: 86 mg/dL (ref >39)
LDL CALC: 96 mg/dL (ref 0–99)
Triglycerides: 56 mg/dL (ref 0–149)
VLDL Cholesterol Cal: 11 mg/dL (ref 5–40)

## 2017-04-25 LAB — PRO B NATRIURETIC PEPTIDE: NT-Pro BNP: 70 pg/mL (ref 0–738)

## 2017-04-25 LAB — CBC
Hematocrit: 30.1 % — ABNORMAL LOW (ref 34.0–46.6)
Hemoglobin: 10.6 g/dL — ABNORMAL LOW (ref 11.1–15.9)
MCH: 29.8 pg (ref 26.6–33.0)
MCHC: 35.2 g/dL (ref 31.5–35.7)
MCV: 85 fL (ref 79–97)
PLATELETS: 249 10*3/uL (ref 150–379)
RBC: 3.56 x10E6/uL — AB (ref 3.77–5.28)
RDW: 14.6 % (ref 12.3–15.4)
WBC: 6 10*3/uL (ref 3.4–10.8)

## 2017-04-27 ENCOUNTER — Telehealth: Payer: Self-pay | Admitting: *Deleted

## 2017-04-27 DIAGNOSIS — E7800 Pure hypercholesterolemia, unspecified: Secondary | ICD-10-CM

## 2017-04-27 NOTE — Telephone Encounter (Signed)
Left message to go over lab results and recommendations.  

## 2017-04-27 NOTE — Telephone Encounter (Signed)
-----   Message from Liliane Shi, [place] sent at 04/26/2017 12:50 PM EST ----- Please call the patient Renal function stable. Potassium normal. LFTs normal. BNP normal. LDL above target (goal less than 70). Hemoglobin stable. PLAN: 1. Increase lovastatin to 20 mg nightly 2. Lipids, LFTs in 3 months Richardson Dopp, [place] 04/26/2017 12:49 PM

## 2017-04-28 ENCOUNTER — Encounter: Payer: Self-pay | Admitting: *Deleted

## 2017-04-28 MED ORDER — LOVASTATIN 20 MG PO TABS: 20.0000 mg | ORAL_TABLET | Freq: Every day | ORAL | 3 refills | Status: DC

## 2017-04-28 NOTE — Telephone Encounter (Signed)
Lmtcb x 2 to go over lab results 

## 2017-04-28 NOTE — Telephone Encounter (Signed)
-----   Message from Liliane Shi, [place] sent at 04/26/2017 12:50 PM EST ----- Please call the patient Renal function stable. Potassium normal. LFTs normal. BNP normal. LDL above target (goal less than 70). Hemoglobin stable. PLAN: 1. Increase lovastatin to 20 mg nightly 2. Lipids, LFTs in 3 months Richardson Dopp, [place] 04/26/2017 12:49 PM

## 2017-04-28 NOTE — Telephone Encounter (Signed)
This encounter was created in error - please disregard.

## 2017-04-28 NOTE — Telephone Encounter (Signed)
Pt has call back and has been notified of lab results. Pt is agreeable to plan of care. Increase Lovastatin to 20 mg daily, FLP/LFT 07/28/17. Pt thanked me for my call.

## 2017-05-07 ENCOUNTER — Other Ambulatory Visit: Payer: Self-pay

## 2017-05-07 ENCOUNTER — Encounter (HOSPITAL_COMMUNITY): Payer: Self-pay | Admitting: Emergency Medicine

## 2017-05-07 ENCOUNTER — Ambulatory Visit (HOSPITAL_COMMUNITY)
Admission: EM | Admit: 2017-05-07 | Discharge: 2017-05-07 | Disposition: A | Discharge status: Home / Self Care | Payer: Medicare HMO | Attending: Family Medicine | Admitting: Family Medicine

## 2017-05-07 DIAGNOSIS — S01511A Laceration without foreign body of lip, initial encounter: Secondary | ICD-10-CM

## 2017-05-07 DIAGNOSIS — W19XXXA Unspecified fall, initial encounter: Secondary | ICD-10-CM

## 2017-05-07 MED ORDER — LIDOCAINE HCL 2 % IJ SOLN
INTRAMUSCULAR | Status: AC
  Filled 2017-05-07: qty 20

## 2017-05-07 MED ORDER — LIDOCAINE-EPINEPHRINE-TETRACAINE (LET) SOLUTION
NASAL | Status: AC
  Filled 2017-05-07: qty 3

## 2017-05-07 NOTE — ED Triage Notes (Signed)
Patient stumbled over an item left in the floor this morning.  Patient fell and states "when I woke there was blood everywhere" patient has a laceration inside upper lip.  Bleeding has slowed.  Patient has dentures.   Patient is alert and oriented x4 .  Answers questions appropriately.  After fall was able to call phone numbers to notify family to come help her.  Family with her today

## 2017-05-07 NOTE — Discharge Instructions (Signed)
I have put sutures in your lip, these will dissolve. I have pulled the lip together so that it will heal better. You have numbing medication in your lip which is why it is swollen right now. I expect this to go do and lips to appear more symmetric.   Healing of the mouth will likely look gross- whitish/yellow then return to normal color.   Please return if pain in area increasing, noting any increased swelling, or drainage.   Please go to emergency room if you have and difficulty speaking, one sided weakness, changes in vision, nausea, vomiting, worsening headache, change in mental status.

## 2017-05-08 DIAGNOSIS — S01511A Laceration without foreign body of lip, initial encounter: Secondary | ICD-10-CM | POA: Diagnosis not present

## 2017-05-08 NOTE — ED Provider Notes (Signed)
[facility]    CSN: [id number] Arrival date & time: 05/07/17  1106     History   Chief Complaint Chief Complaint  Patient presents with  . Fall    HPI Evelyn Morrison is a 77 y.o. female   history of hypertension, diabetes, coronary artery disease presenting today after a fall with a laceration to the inside of her upper lip.  He states that she tripped over a dust pan that was in the middle of the floor earlier today and fell forward and hit her face on the floor.  She denies any loss of consciousness, changes in vision, nausea, vomiting, one-sided weakness, difficulty speaking.  She was able to get up on her own and called her family.  She is not on any anticoagulants but does take a daily baby aspirin.  Her mouth was bleeding a lot so she came in here today.  She does endorse a headache but states that this is mild and is not worst headache of her life.  She denies anything else hurting her.  Denies any dizziness or lightheadedness.  HPI  Past Medical History:  Diagnosis Date  . Anemia   . Arthritis   . CAD (coronary artery disease)    Myoview 12/17: EF 66, apical and apical lateral defect - likely attenuation artifact, no ischemia; Low Risk  . Cataract, diabetic ([place])   . Chest pain   . Constipation   . Contusion of arm   . Diabetes mellitus   . Diabetic retinopathy   . DM (diabetes mellitus) ([place])   . Esophageal stricture   . Fatigue   . Glaucoma   . Heart burn   . Hiatal hernia   . HLD (hyperlipidemia)   . Hypertension   . Joint stiffness of hand   . Urinary incontinence   . Vitamin D deficiency     Patient Active Problem List   Diagnosis Date Noted  . Dysphagia, unspecified(787.20) 10/23/2010  . Abdominal pain, unspecified site 10/23/2010  . Stricture esophagus 10/23/2010  . Irritable bowel syndrome (IBS) 10/23/2010  . Constipation, chronic 10/23/2010  . Syncope 10/16/2010  . GERD (gastroesophageal reflux disease) 09/11/2010  . Chest pain,  unspecified 09/11/2010  . ABSCESS 05/23/2010  . ACUTE CYSTITIS 05/31/2009  . SHOULDER PAIN, LEFT 05/31/2009  . POSTMENOPAUSAL STATUS 05/31/2009  . CONSTIPATION 11/03/2008  . CONTUSION, ARM 07/20/2008  . JOINT STIFFNESS, HAND 06/15/2008  . URINARY INCONTINENCE 06/15/2008  . VITAMIN D DEFICIENCY 03/20/2008  . ARTHRITIS 03/14/2008  . ANEMIA 03/08/2008  . Type 2 diabetes mellitus without complication, without long-term current use of insulin ([place]) 12/11/2006  . DIABETIC  RETINOPATHY 12/11/2006  . HLD (hyperlipidemia) 12/11/2006  . CATARACT, DIABETIC 12/11/2006  . Essential hypertension 12/11/2006  . Coronary artery disease involving native coronary artery of native heart without angina pectoris 12/11/2006  . FATIGUE 12/11/2006    Past Surgical History:  Procedure Laterality Date  . adenosine cardillite  05/16/05   negative signif. myocardial ischemic  . CARDIAC CATHETERIZATION  10/04   s/p stent LV 45%  . CATARACT EXTRACTION     bilateral  . CESAREAN SECTION  84, 86   ectopic pregnancy  . CHOLECYSTECTOMY  1998  . CORONARY ANGIOPLASTY WITH STENT PLACEMENT  10/04  . PARTIAL HYSTERECTOMY      OB History    No data available       Home Medications    Prior to Admission medications   Medication Sig Start Date End Date Taking? Authorizing Provider  amLODipine (NORVASC) 10 MG tablet Take 10 mg by mouth every morning.  02/06/15   [provider]  aspirin 81 MG EC tablet Take 81 mg by mouth every morning.     [provider]  Calcium Carbonate-Vitamin D (CALTRATE 600+D) 600-400 MG-UNIT per tablet Take 1 tablet by mouth daily.     [provider]  escitalopram (LEXAPRO) 20 MG tablet Take 20 mg by mouth daily. 01/23/17   [provider]  gabapentin (NEURONTIN) 100 MG capsule Take 100 mg by mouth daily. 01/23/15   [provider]  lisinopril-hydrochlorothiazide (PRINZIDE,ZESTORETIC) 20-25 MG per tablet Take 1 tablet by mouth every morning.      [provider]  lovastatin (MEVACOR) 20 MG tablet Take 1 tablet (20 mg total) by mouth at bedtime. 04/28/17   Richardson Dopp T, PA-C  meloxicam (MOBIC) 15 MG tablet Take 15 mg by mouth daily. 03/03/17   [provider]  metFORMIN (GLUCOPHAGE) 500 MG tablet Take 500 mg by mouth daily with breakfast.      [provider]  Multiple Vitamin (MULTIVITAMIN) tablet Take 1 tablet by mouth daily.      [provider]  nitroGLYCERIN (NITROSTAT) 0.4 MG SL tablet Place 0.4 mg under the tongue every 5 (five) minutes as needed for chest pain (3 doses max).     [provider]  polyethylene glycol powder (MIRALAX) powder Take 17 g by mouth daily as needed. Constipation    [provider]  senna (SENOKOT) 8.6 MG tablet Take 2 tablets by mouth daily as needed for constipation.    [provider]    Family History Family History  Problem Relation Age of Onset  . Heart disease Mother   . Heart attack Father   . Diabetes Unknown   . Crohn's disease Brother   . Crohn's disease Brother   . Crohn's disease Brother   . Colon cancer Neg Hx     Social History Social History   Tobacco Use  . Smoking status: Former Smoker    Last attempt to quit: 03/31/1997    Years since quitting: 20.1  . Smokeless tobacco: Never Used  Substance Use Topics  . Alcohol use: No    Comment: ocassional  . Drug use: No     Allergies   Penicillins; Lactose intolerance (gi); Milk-related compounds; and Other   Review of Systems Review of Systems  Constitutional: Negative for chills, diaphoresis, fatigue and fever.  HENT: Negative for ear pain, sore throat and trouble swallowing.   Eyes: Negative for pain and visual disturbance.  Respiratory: Negative for cough and shortness of breath.   Cardiovascular: Negative for chest pain and palpitations.  Gastrointestinal: Negative for abdominal pain, nausea and vomiting.  Musculoskeletal: Negative for arthralgias,  back pain and myalgias.  Skin: Positive for wound. Negative for color change and rash.  Neurological: Positive for headaches. Negative for dizziness, seizures, syncope, weakness and light-headedness.  All other systems reviewed and are negative.    Physical Exam Triage Vital Signs ED Triage Vitals  Enc Vitals Group     BP 05/07/17 1115 121/82     Pulse Rate 05/07/17 1115 84     Resp 05/07/17 1115 (!) 22     Temp 05/07/17 1115 98.6 F (37 C)     Temp Source 05/07/17 1115 Oral     SpO2 05/07/17 1115 100 %     Weight --      Height --      Head Circumference --  Peak Flow --      Pain Score 05/07/17 1113 7     Pain Loc --      Pain Edu? --      Excl. in [place]? --    No data found.  Updated Vital Signs BP 121/82 (BP Location: Left Arm)   Pulse 84   Temp 98.6 F (37 C) (Oral)   Resp (!) 22   SpO2 100%   Physical Exam  Constitutional: She is oriented to person, place, and time. She appears well-developed and well-nourished. No distress.  HENT:  Head: Normocephalic and atraumatic.  Right Ear: Tympanic membrane and ear canal normal.  Left Ear: Tympanic membrane and ear canal normal.  Nose: Nose normal.  Mouth/Throat: Uvula is midline, oropharynx is clear and moist and mucous membranes are normal. No oral lesions. No trismus in the jaw. No uvula swelling.  1 cm laceration extending from oral mucosa at tip of lip inward extending upward on the inside of lip; laceration is gaping and it appears approximately half centimeter deep; bleeding relatively controlled  Eyes: Conjunctivae and EOM are normal. Pupils are equal, round, and reactive to light.  Neck: Neck supple.  Cardiovascular: Normal rate and regular rhythm.  No murmur heard. Pulmonary/Chest: Effort normal and breath sounds normal. No respiratory distress.  Abdominal: Soft. There is no tenderness.  Musculoskeletal: She exhibits no edema.  Neurological: She is alert and oriented to person, place, and time. She displays  normal reflexes. No cranial nerve deficit. Coordination normal.  Cranial nerves II through XII grossly intact, strength 5 out of 5 and equal bilaterally at shoulders hips and knees.  Patient has full range of motion.  Negative Romberg negative pronator drift.  Finger to nose and heel to shin normal bilaterally, rapid alternating movements normal.  No abnormalities in gait.  Patient appears to be at her baseline neurologically  Skin: Skin is warm and dry.  Psychiatric: She has a normal mood and affect.  Nursing note and vitals reviewed.    UC Treatments / Results  Labs (all labs ordered are listed, but only abnormal results are displayed) Labs Reviewed - No data to display  EKG  EKG Interpretation None       Radiology No results found.  Procedures Laceration Repair Date/Time: 05/08/2017 9:26 AM Performed by: Kahlin Mark, Elesa Hacker, PA-C Authorized by: Vanessa Kick, MD   Consent:    Consent obtained:  Verbal   Consent given by:  Patient   Risks discussed:  Infection, poor cosmetic result and pain Anesthesia (see MAR for exact dosages):    Anesthesia method:  Topical application and local infiltration   Topical anesthetic:  LET   Local anesthetic:  Lidocaine 2% w/o epi Laceration details:    Location:  Lip   Lip location:  Upper interior lip   Length (cm):  1.5   Depth (mm):  5 Repair type:    Repair type:  Simple Pre-procedure details:    Preparation:  Patient was prepped and draped in usual sterile fashion Exploration:    Hemostasis achieved with:  Direct pressure and LET   Wound exploration: wound explored through full range of motion     Wound extent: no foreign bodies/material noted and no tendon damage noted     Contaminated: no   Treatment:    Area cleansed with:  Soap and water   Amount of cleaning:  Standard   Irrigation solution:  Tap water   Irrigation method:  Tap   Visualized foreign bodies/material  removed: no   Skin repair:    Repair method:  Sutures    Suture size:  6-0   Wound skin closure material used: Vicryl.   Suture technique:  Horizontal mattress   Number of sutures:  4 Approximation:    Approximation:  Loose Post-procedure details:    Dressing:  Open (no dressing)   Patient tolerance of procedure:  Tolerated well, no immediate complications Comments:     Laceration edges were pulled together, alignment was loose but is closer to allow for better healing   (including critical care time)  Medications Ordered in UC Medications - No data to display   Initial Impression / Assessment and Plan / UC Course  I have reviewed the triage vital signs and the nursing notes.  Pertinent labs & imaging results that were available during my care of the patient were reviewed by me and considered in my medical decision making (see chart for details).     Patient presenting after a fall, does not appear to have any focal neuro deficits.  Advised patient and her son today continue to monitor and provided with symptoms to watch out for that would warrant emergency evaluation.  She plans to take Tylenol at home for her headache.  Patient tolerated laceration repair well.  Left part of the lip appeared more swollen after procedure, likely from the lidocaine injected, advised that this should resolve as the lidocaine wears off.  Advised her to follow-up if she has any increased pain, swelling, drainage to the area. Discussed strict return precautions. Patient verbalized understanding and is agreeable with plan.   Final Clinical Impressions(s) / UC Diagnoses   Final diagnoses:  Fall, initial encounter  Lip laceration, initial encounter    ED Discharge Orders    None       Controlled Substance Prescriptions [place] Controlled Substance Registry consulted? Not Applicable   Janith Lima, [place] 05/08/17 0930

## 2017-05-09 ENCOUNTER — Emergency Department (HOSPITAL_COMMUNITY)
Admission: EM | Admit: 2017-05-09 | Discharge: 2017-05-09 | Disposition: A | Discharge status: Home / Self Care | Payer: Medicare HMO | Attending: Emergency Medicine | Admitting: Emergency Medicine

## 2017-05-09 ENCOUNTER — Encounter (HOSPITAL_COMMUNITY): Payer: Self-pay

## 2017-05-09 ENCOUNTER — Other Ambulatory Visit: Payer: Self-pay

## 2017-05-09 DIAGNOSIS — Z79899 Other long term (current) drug therapy: Secondary | ICD-10-CM | POA: Diagnosis not present

## 2017-05-09 DIAGNOSIS — Z87891 Personal history of nicotine dependence: Secondary | ICD-10-CM | POA: Insufficient documentation

## 2017-05-09 DIAGNOSIS — I10 Essential (primary) hypertension: Secondary | ICD-10-CM | POA: Insufficient documentation

## 2017-05-09 DIAGNOSIS — Z7982 Long term (current) use of aspirin: Secondary | ICD-10-CM | POA: Insufficient documentation

## 2017-05-09 DIAGNOSIS — I2510 Atherosclerotic heart disease of native coronary artery without angina pectoris: Secondary | ICD-10-CM | POA: Insufficient documentation

## 2017-05-09 DIAGNOSIS — Z955 Presence of coronary angioplasty implant and graft: Secondary | ICD-10-CM | POA: Insufficient documentation

## 2017-05-09 DIAGNOSIS — Y33XXXD Other specified events, undetermined intent, subsequent encounter: Secondary | ICD-10-CM | POA: Insufficient documentation

## 2017-05-09 DIAGNOSIS — R220 Localized swelling, mass and lump, head: Secondary | ICD-10-CM

## 2017-05-09 DIAGNOSIS — Z91040 Latex allergy status: Secondary | ICD-10-CM | POA: Diagnosis not present

## 2017-05-09 DIAGNOSIS — S01511D Laceration without foreign body of lip, subsequent encounter: Secondary | ICD-10-CM | POA: Insufficient documentation

## 2017-05-09 DIAGNOSIS — Z7984 Long term (current) use of oral hypoglycemic drugs: Secondary | ICD-10-CM | POA: Diagnosis not present

## 2017-05-09 DIAGNOSIS — E119 Type 2 diabetes mellitus without complications: Secondary | ICD-10-CM | POA: Diagnosis not present

## 2017-05-09 MED ORDER — DOXYCYCLINE HYCLATE 100 MG PO CAPS: 100.0000 mg | ORAL_CAPSULE | Freq: Two times a day (BID) | ORAL | 0 refills | Status: DC

## 2017-05-09 NOTE — ED Notes (Signed)
Declined W/C at D/C and was escorted to lobby by RN. 

## 2017-05-09 NOTE — Discharge Instructions (Signed)
Take antibiotics as prescribed. Take the entire course, even if your symptoms improve. Use Tylenol 3 times a day.  You may take 1 or 2 tablets, as needed.  Continue taking all your other medications as prescribed.   Apply ice to the swollen lip as frequently as possible, 20 minutes at a time. Follow-up with your primary care doctor at your scheduled appointment on Monday. Return to the emergency room if you develop high fevers, worsening pain, difficulty breathing, or any new or worsening symptoms.

## 2017-05-09 NOTE — ED Provider Notes (Signed)
[facility] EMERGENCY DEPARTMENT Provider Note   CSN: [id number] Arrival date & time: 05/09/17  1503     History   Chief Complaint Chief Complaint  Patient presents with  . Mouth Injury    HPI Evelyn Morrison is a 77 y.o. female presenting for evaluation of swollen upper lip.  Patient states 2 days ago she fell, and hit her upper lip.  She was evaluated at urgent care, and stitches were placed.  She is on aspirin, not on other blood thinners.  She denies loss of consciousness.  She denies head neck or back pain at this time.  She presents today, as her upper lip has continued to swell and be painful.  She has been taking Tylenol, which improves the pain.  She has been using ice, but denies improvement of swelling with this.  She denies fevers or drainage from the cut.  She denies pain or injury elsewhere.  Pain is worse with movement of the lip and palpation.  It is constant, does not radiate.  Described as an ache.  HPI  Past Medical History:  Diagnosis Date  . Anemia   . Arthritis   . CAD (coronary artery disease)    Myoview 12/17: EF 66, apical and apical lateral defect - likely attenuation artifact, no ischemia; Low Risk  . Cataract, diabetic ([place])   . Chest pain   . Constipation   . Contusion of arm   . Diabetes mellitus   . Diabetic retinopathy   . DM (diabetes mellitus) ([place])   . Esophageal stricture   . Fatigue   . Glaucoma   . Heart burn   . Hiatal hernia   . HLD (hyperlipidemia)   . Hypertension   . Joint stiffness of hand   . Urinary incontinence   . Vitamin D deficiency     Patient Active Problem List   Diagnosis Date Noted  . Dysphagia, unspecified(787.20) 10/23/2010  . Abdominal pain, unspecified site 10/23/2010  . Stricture esophagus 10/23/2010  . Irritable bowel syndrome (IBS) 10/23/2010  . Constipation, chronic 10/23/2010  . Syncope 10/16/2010  . GERD (gastroesophageal reflux disease) 09/11/2010  . Chest pain, unspecified  09/11/2010  . ABSCESS 05/23/2010  . ACUTE CYSTITIS 05/31/2009  . SHOULDER PAIN, LEFT 05/31/2009  . POSTMENOPAUSAL STATUS 05/31/2009  . CONSTIPATION 11/03/2008  . CONTUSION, ARM 07/20/2008  . JOINT STIFFNESS, HAND 06/15/2008  . URINARY INCONTINENCE 06/15/2008  . VITAMIN D DEFICIENCY 03/20/2008  . ARTHRITIS 03/14/2008  . ANEMIA 03/08/2008  . Type 2 diabetes mellitus without complication, without long-term current use of insulin ([place]) 12/11/2006  . DIABETIC  RETINOPATHY 12/11/2006  . HLD (hyperlipidemia) 12/11/2006  . CATARACT, DIABETIC 12/11/2006  . Essential hypertension 12/11/2006  . Coronary artery disease involving native coronary artery of native heart without angina pectoris 12/11/2006  . FATIGUE 12/11/2006    Past Surgical History:  Procedure Laterality Date  . adenosine cardillite  05/16/05   negative signif. myocardial ischemic  . CARDIAC CATHETERIZATION  10/04   s/p stent LV 45%  . CATARACT EXTRACTION     bilateral  . CESAREAN SECTION  84, 86   ectopic pregnancy  . CHOLECYSTECTOMY  1998  . CORONARY ANGIOPLASTY WITH STENT PLACEMENT  10/04  . PARTIAL HYSTERECTOMY      OB History    No data available       Home Medications    Prior to Admission medications   Medication Sig Start Date End Date Taking? Authorizing Provider  acetaminophen (TYLENOL) 500  MG tablet Take 500-1,000 mg by mouth every 6 (six) hours as needed (for pain).   Yes [provider]  amLODipine (NORVASC) 10 MG tablet Take 10 mg by mouth every morning.  02/06/15  Yes [provider]  aspirin 81 MG EC tablet Take 81 mg by mouth every morning.    Yes [provider]  Calcium Carbonate-Vitamin D (CALTRATE 600+D) 600-400 MG-UNIT per tablet Take 1 tablet by mouth daily.    Yes [provider]  escitalopram (LEXAPRO) 20 MG tablet Take 20 mg by mouth daily. 01/23/17  Yes [provider]  gabapentin (NEURONTIN) 300 MG capsule Take 300 mg by mouth 2 (two) times  daily.   Yes [provider]  lisinopril-hydrochlorothiazide (PRINZIDE,ZESTORETIC) 20-12.5 MG tablet Take 1 tablet by mouth daily.   Yes [provider]  lovastatin (MEVACOR) 20 MG tablet Take 1 tablet (20 mg total) by mouth at bedtime. 04/28/17  Yes Weaver, Scott T, PA-C  meloxicam (MOBIC) 15 MG tablet Take 15 mg by mouth daily. 03/03/17  Yes [provider]  metFORMIN (GLUCOPHAGE) 500 MG tablet Take 500 mg by mouth daily with breakfast.     Yes [provider]  Multiple Vitamin (MULTIVITAMIN) tablet Take 1 tablet by mouth daily.     Yes [provider]  nitroGLYCERIN (NITROSTAT) 0.4 MG SL tablet Place 0.4 mg under the tongue every 5 (five) minutes as needed for chest pain (3 doses max).    Yes [provider]  doxycycline (VIBRAMYCIN) 100 MG capsule Take 1 capsule (100 mg total) by mouth 2 (two) times daily. 05/09/17   Shyana Kulakowski, PA-C  gabapentin (NEURONTIN) 100 MG capsule Take 100 mg by mouth daily. 01/23/15   [provider]  senna (SENOKOT) 8.6 MG tablet Take 2 tablets by mouth daily as needed for constipation.    [provider]    Family History Family History  Problem Relation Age of Onset  . Heart disease Mother   . Heart attack Father   . Diabetes Unknown   . Crohn's disease Brother   . Crohn's disease Brother   . Crohn's disease Brother   . Colon cancer Neg Hx     Social History Social History   Tobacco Use  . Smoking status: Former Smoker    Last attempt to quit: 03/31/1997    Years since quitting: 20.1  . Smokeless tobacco: Never Used  Substance Use Topics  . Alcohol use: No    Comment: ocassional  . Drug use: No     Allergies   Penicillins; Ibuprofen; Lactose intolerance (gi); Milk-related compounds; Nyquil multi-symptom [pseudoeph-doxylamine-dm-apap]; and Latex   Review of Systems Review of Systems  Constitutional: Negative for chills and fever.  HENT: Positive for facial swelling.          Upper lip swelling  Hematological: Does not bruise/bleed easily.     Physical Exam Updated Vital Signs BP 99/70   Pulse 78   Temp 98.4 F (36.9 C)   Resp 18   SpO2 100%   Physical Exam  Constitutional: She is oriented to person, place, and time. She appears well-developed and well-nourished. No distress.  HENT:  Head: Normocephalic.  Right Ear: Tympanic membrane and ear canal normal.  Left Ear: Tympanic membrane, external ear and ear canal normal.  Nose: Nose normal.  Mouth/Throat: Uvula is midline, oropharynx is clear and moist and mucous membranes are normal. Lacerations present.  Mild upper lip swelling.  Well-healing laceration of the inner lip without  drainage.  No tenderness palpation surrounding the laceration.  No swelling or injury noted elsewhere on the face.  Eyes: EOM are normal.  Neck: Normal range of motion.  Cardiovascular: Normal rate, regular rhythm and intact distal pulses.  Pulmonary/Chest: Effort normal and breath sounds normal. No respiratory distress. She has no wheezes.  Abdominal: She exhibits no distension.  Musculoskeletal: Normal range of motion.  Neurological: She is alert and oriented to person, place, and time.  Skin: Skin is warm. No rash noted.  Psychiatric: She has a normal mood and affect.  Nursing note and vitals reviewed.    ED Treatments / Results  Labs (all labs ordered are listed, but only abnormal results are displayed) Labs Reviewed - No data to display  EKG  EKG Interpretation None       Radiology No results found.  Procedures Procedures (including critical care time)  Medications Ordered in ED Medications - No data to display   Initial Impression / Assessment and Plan / ED Course  I have reviewed the triage vital signs and the nursing notes.  Pertinent labs & imaging results that were available during my care of the patient were reviewed by me and considered in my medical decision making (see chart for  details).     Patient presenting for evaluation of upper lip swelling after a fall 2 days ago.  Physical exam reassuring, she is afebrile not tachycardic.  She appears nontoxic.  Mild upper lip swelling with well-healing laceration of the inner lip.  No drainage.  No tenderness surrounding the laceration.  Likely residual swelling due to injury, sutures, and lidocaine.  Discussed with patient.  Discussed continuing to use Tylenol and ice.  As patient is not on any antibiotics, will give antibiotics for prophylaxis, as this is an intraoral oral injury.  She is allergic to penicillins.  Patient has an appointment to follow-up with her primary care doctor Monday.  At this time, patient appears safe for discharge.  Return precautions given.  Patient states she understands and agrees to plan.   Final Clinical Impressions(s) / ED Diagnoses   Final diagnoses:  Lip laceration, subsequent encounter  Swelling of upper lip    ED Discharge Orders        Ordered    doxycycline (VIBRAMYCIN) 100 MG capsule  2 times daily     05/09/17 1714       Sakura Denis, PA-C 05/09/17 1743    Quintella Reichert, MD 05/10/17 1455

## 2017-05-09 NOTE — ED Triage Notes (Signed)
Pt presents to the ed with family for swelling around her stitches site.  Pt states it started swelling yesterday and got worse today. No drainage is present.

## 2017-05-13 ENCOUNTER — Telehealth (HOSPITAL_COMMUNITY): Payer: Self-pay | Admitting: *Deleted

## 2017-05-13 NOTE — Telephone Encounter (Signed)
Left message with daughter, in reference to upcoming appointment scheduled for 05/19/17. Phone number given for a call back so details instructions can be given. Kirstie Peri

## 2017-05-19 ENCOUNTER — Encounter (HOSPITAL_COMMUNITY): Payer: Medicare HMO

## 2017-05-19 ENCOUNTER — Other Ambulatory Visit (HOSPITAL_COMMUNITY): Payer: Medicare HMO

## 2017-05-26 ENCOUNTER — Telehealth (HOSPITAL_COMMUNITY): Payer: Self-pay | Admitting: *Deleted

## 2017-05-26 NOTE — Telephone Encounter (Signed)
Left message on voicemail in reference to upcoming appointment scheduled for 05/29/17. Phone number given for a call back so details instructions can be given. Kirstie Peri, RN

## 2017-05-29 ENCOUNTER — Ambulatory Visit (HOSPITAL_COMMUNITY): Payer: Medicare HMO | Attending: Cardiology

## 2017-05-29 ENCOUNTER — Other Ambulatory Visit: Payer: Self-pay

## 2017-05-29 ENCOUNTER — Encounter (INDEPENDENT_AMBULATORY_CARE_PROVIDER_SITE_OTHER): Payer: Self-pay

## 2017-05-29 ENCOUNTER — Ambulatory Visit (HOSPITAL_BASED_OUTPATIENT_CLINIC_OR_DEPARTMENT_OTHER): Payer: Medicare HMO

## 2017-05-29 ENCOUNTER — Telehealth: Payer: Self-pay | Admitting: *Deleted

## 2017-05-29 ENCOUNTER — Encounter: Payer: Self-pay | Admitting: Physician Assistant

## 2017-05-29 DIAGNOSIS — R079 Chest pain, unspecified: Secondary | ICD-10-CM | POA: Diagnosis not present

## 2017-05-29 DIAGNOSIS — I2510 Atherosclerotic heart disease of native coronary artery without angina pectoris: Secondary | ICD-10-CM | POA: Insufficient documentation

## 2017-05-29 DIAGNOSIS — R0789 Other chest pain: Secondary | ICD-10-CM | POA: Diagnosis not present

## 2017-05-29 DIAGNOSIS — R0602 Shortness of breath: Secondary | ICD-10-CM | POA: Insufficient documentation

## 2017-05-29 DIAGNOSIS — I10 Essential (primary) hypertension: Secondary | ICD-10-CM | POA: Diagnosis not present

## 2017-05-29 DIAGNOSIS — E785 Hyperlipidemia, unspecified: Secondary | ICD-10-CM | POA: Insufficient documentation

## 2017-05-29 DIAGNOSIS — R0600 Dyspnea, unspecified: Secondary | ICD-10-CM | POA: Diagnosis not present

## 2017-05-29 DIAGNOSIS — E119 Type 2 diabetes mellitus without complications: Secondary | ICD-10-CM | POA: Insufficient documentation

## 2017-05-29 LAB — MYOCARDIAL PERFUSION IMAGING
CHL CUP NUCLEAR SDS: 3
CHL CUP NUCLEAR SRS: 8
CHL CUP NUCLEAR SSS: 11
CHL CUP RESTING HR STRESS: 84 {beats}/min
LHR: 0.33
LV dias vol: 51 mL (ref 46–106)
LV sys vol: 13 mL
NUC STRESS TID: 0.86
Peak HR: 99 {beats}/min

## 2017-05-29 LAB — ECHOCARDIOGRAM COMPLETE
Ao-asc: 32 cm
CHL CUP DOP CALC LVOT VTI: 22 cm
CHL CUP MV DEC (S): 257
CHL CUP STROKE VOLUME: 26 mL
CHL CUP TV REG PEAK VELOCITY: 278 cm/s
E/e' ratio: 7.27
EWDT: 257 ms
FS: 28 % (ref 28–44)
IVS/LV PW RATIO, ED: 1.55
LA ID, A-P, ES: 31 mm
LA diam end sys: 31 mm
LADIAMINDEX: 1.85 cm/m2
LAVOL: 29 mL
LAVOLA4C: 25 mL
LAVOLIN: 17.3 mL/m2
LV E/e'average: 7.27
LV TDI E'MEDIAL: 6.73
LV dias vol index: 29 mL/m2
LV sys vol: 22 mL (ref 14–42)
LVDIAVOL: 48 mL (ref 46–106)
LVEEMED: 7.27
LVELAT: 8.97 cm/s
LVOT area: 2.27 cm2
LVOT peak grad rest: 4 mmHg
LVOTD: 17 mm
LVOTPV: 101 cm/s
LVOTSV: 50 mL
LVSYSVOLIN: 13 mL/m2
MV pk A vel: 91.8 m/s
MVPKEVEL: 65.2 m/s
PW: 8.02 mm — AB (ref 0.6–1.1)
S' Lateral: 14.8 cm/s
Simpson's disk: 54
TDI e' lateral: 8.97
TRMAXVEL: 278 cm/s

## 2017-05-29 MED ORDER — REGADENOSON 0.4 MG/5ML IV SOLN
0.4000 mg | Freq: Once | INTRAVENOUS | Status: AC
  Administered 2017-05-29: 0.4 mg via INTRAVENOUS

## 2017-05-29 MED ORDER — TECHNETIUM TC 99M TETROFOSMIN IV KIT
10.5000 | PACK | Freq: Once | INTRAVENOUS | Status: AC | PRN
  Administered 2017-05-29: 10.5 via INTRAVENOUS
  Filled 2017-05-29: qty 11

## 2017-05-29 MED ORDER — TECHNETIUM TC 99M TETROFOSMIN IV KIT
31.6000 | PACK | Freq: Once | INTRAVENOUS | Status: AC | PRN
  Administered 2017-05-29: 31.6 via INTRAVENOUS
  Filled 2017-05-29: qty 32

## 2017-05-29 NOTE — Telephone Encounter (Signed)
-----   Message from Liliane Shi, [place] sent at 05/29/2017  5:02 PM EST ----- Please call the patient. The echo is ok and demonstrates normal heart function. Continue current medications and follow up as planned.  Please fax a copy of this study result to her PCP:  Benito Mccreedy, MD  Thanks! Richardson Dopp, PA-C    05/29/2017 5:00 PM

## 2017-05-29 NOTE — Telephone Encounter (Signed)
DPR for pt's daughter Arma Heading who has been notified of both Myoview and Echo results for the pt by phone with verbal understanding. Pt's daughter thanked me for my call.

## 2017-07-28 ENCOUNTER — Other Ambulatory Visit: Payer: Medicare HMO

## 2017-11-26 ENCOUNTER — Other Ambulatory Visit: Payer: Self-pay | Admitting: Nephrology

## 2017-11-26 DIAGNOSIS — D631 Anemia in chronic kidney disease: Secondary | ICD-10-CM

## 2017-11-26 DIAGNOSIS — N183 Chronic kidney disease, stage 3 unspecified: Secondary | ICD-10-CM

## 2017-11-26 DIAGNOSIS — I129 Hypertensive chronic kidney disease with stage 1 through stage 4 chronic kidney disease, or unspecified chronic kidney disease: Secondary | ICD-10-CM

## 2017-11-26 DIAGNOSIS — N189 Chronic kidney disease, unspecified: Secondary | ICD-10-CM

## 2017-12-04 ENCOUNTER — Other Ambulatory Visit: Payer: Medicare HMO

## 2017-12-04 ENCOUNTER — Ambulatory Visit
Admission: RE | Admit: 2017-12-04 | Discharge: 2017-12-04 | Disposition: A | Discharge status: Home / Self Care | Payer: Medicare HMO | Source: Ambulatory Visit | Attending: Nephrology | Admitting: Nephrology

## 2017-12-04 DIAGNOSIS — N189 Chronic kidney disease, unspecified: Secondary | ICD-10-CM

## 2017-12-04 DIAGNOSIS — N183 Chronic kidney disease, stage 3 unspecified: Secondary | ICD-10-CM

## 2017-12-04 DIAGNOSIS — I129 Hypertensive chronic kidney disease with stage 1 through stage 4 chronic kidney disease, or unspecified chronic kidney disease: Secondary | ICD-10-CM

## 2017-12-04 DIAGNOSIS — D631 Anemia in chronic kidney disease: Secondary | ICD-10-CM

## 2018-02-16 ENCOUNTER — Other Ambulatory Visit: Payer: Self-pay

## 2018-02-16 ENCOUNTER — Encounter (HOSPITAL_COMMUNITY): Payer: Self-pay

## 2018-02-16 ENCOUNTER — Ambulatory Visit (HOSPITAL_COMMUNITY)
Admission: EM | Admit: 2018-02-16 | Discharge: 2018-02-16 | Disposition: A | Discharge status: Home / Self Care | Payer: Medicare HMO | Attending: Family Medicine | Admitting: Family Medicine

## 2018-02-16 DIAGNOSIS — L03116 Cellulitis of left lower limb: Secondary | ICD-10-CM

## 2018-02-16 DIAGNOSIS — W57XXXA Bitten or stung by nonvenomous insect and other nonvenomous arthropods, initial encounter: Secondary | ICD-10-CM | POA: Diagnosis not present

## 2018-02-16 DIAGNOSIS — S80862A Insect bite (nonvenomous), left lower leg, initial encounter: Secondary | ICD-10-CM

## 2018-02-16 MED ORDER — SULFAMETHOXAZOLE-TRIMETHOPRIM 800-160 MG PO TABS: 1.0000 | ORAL_TABLET | Freq: Two times a day (BID) | ORAL | 0 refills | Status: AC

## 2018-02-16 NOTE — ED Provider Notes (Addendum)
[facility]   [id number] 02/16/18 Arrival Time: 6599  ASSESSMENT & PLAN:  1. Insect bite of left lower leg, initial encounter   2. Cellulitis of left lower extremity   No sign of an abscess. No I&D attempt warranted at this time. No reason for hospitalization for IV antibiotics. Discussed.  Meds ordered this encounter  Medications  . sulfamethoxazole-trimethoprim (BACTRIM DS,SEPTRA DS) 800-160 MG tablet    Sig: Take 1 tablet by mouth 2 (two) times daily for 10 days.    Dispense:  20 tablet    Refill:  0  Will start this evening.  Follow-up Information    Benito Mccreedy, MD.   Specialty:  Internal Medicine Why:  As needed. Contact information: [address] [place] [address] [phone]        [facility].   Specialty:  Urgent Care Why:  If symptoms worsen or if you are not seeing improvement over the next 48 hours. Contact information: [place] [phone]         Reviewed expectations re: course of current medical issues. Questions answered. Outlined signs and symptoms indicating need for more acute intervention. Patient verbalized understanding. After Visit Summary given.   SUBJECTIVE:  Evelyn Morrison is a 77 y.o. female who presents with a skin complaint. Questions an insect bite.   Location: L lateral calf "Felt a prick when I was outside a couple of days ago." Reports increasing soreness, warmth, and redness. Ambulatory without difficulty. Associated pruritis? none Associated pain? minimal Progression: increasing steadily  Drainage? No  Contacts with similar? No Recent travel? No  Other associated symptoms: mild fatigue Therapies tried thus far: none Arthralgia or myalgia? none Recent illness? none Fever? none No specific aggravating or alleviating factors reported.  ROS: As per HPI. All other systems negative.    OBJECTIVE: Vitals:   02/16/18 1603 02/16/18 1605  BP:  107/68  Pulse:  84  Resp:  18  Temp:  98.1 F (36.7 C)  TempSrc:  Oral  SpO2:  99%  Weight: 67.1 kg     General appearance: alert; no distress Lungs: clear to auscultation bilaterally Heart: regular rate and rhythm Abd: soft; non-tender Extremities: no edema Skin: dry; approx 3x4 cm of erythema over L lateral calf; hot to touch; very slight skin thickening at center without fluctuance; minimal tenderness to palpation Neuro: normal gait; normal distal sensation of LLE Psychological: alert and cooperative; normal mood and affect  Allergies  Allergen Reactions  . Penicillins Hives, Palpitations and Other (See Comments)    Mouth foams, also  Has patient had a PCN reaction causing immediate rash, facial/tongue/throat swelling, SOB or lightheadedness with hypotension: Yes Has patient had a PCN reaction causing severe rash involving mucus membranes or skin necrosis: No Has patient had a PCN reaction that required hospitalization: No Has patient had a PCN reaction occurring within the last 10 years: No If all of the above answers are "NO", then may proceed with Cephalosporin use.   . Ibuprofen Other (See Comments)    Made the patient shake and reacts with some of her meds  . Lactose Intolerance (Gi) Other (See Comments)    Stomach pain  . Milk-Related Compounds Other (See Comments)    Causes a stomach ache  . Nyquil Multi-Symptom [Pseudoeph-Doxylamine-Dm-Apap] Other (See Comments)    Made the patient shake (tolerates Coricidin)  . Latex Rash    No "black" latex  Past Medical History:  Diagnosis Date  . Anemia   . Arthritis   . CAD (coronary artery disease)    Myoview 12/17: EF 66, apical and apical lateral defect - likely attenuation artifact, no ischemia; Low Risk // Myoview 3/19:  EF 74, no ischemia or scar; low risk   . Cataract, diabetic ([place])   . Chest pain   . Constipation   . Contusion of arm   . Diabetes  mellitus   . Diabetic retinopathy   . DM (diabetes mellitus) ([place])   . Esophageal stricture   . Fatigue   . Glaucoma   . Heart burn   . Hiatal hernia   . History of echocardiogram    Echo 3/19:  Mild LVH, EF 60-65, no RWMA, Gr 1 DD, PASP 34  . HLD (hyperlipidemia)   . Hypertension   . Joint stiffness of hand   . Urinary incontinence   . Vitamin D deficiency    Social History   Socioeconomic History  . Marital status: Married    Spouse name: Not on file  . Number of children: 5   . Years of education: Not on file  . Highest education level: Not on file  Occupational History  . 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 Financial resource strain: Not on file  . Food insecurity:    Worry: Not on file    Inability: Not on file  . Transportation needs:    Medical: Not on file    Non-medical: Not on file  Tobacco Use  . Smoking status: Former Smoker    Last attempt to quit: 03/31/1997    Years since quitting: 20.8  . Smokeless tobacco: Never Used  Substance and Sexual Activity  . Alcohol use: No    Comment: ocassional  . Drug use: No  . Sexual activity: Not on file  Lifestyle  . Physical activity:    Days per week: Not on file    Minutes per session: Not on file  . Stress: Not on file  Relationships  . Social connections:    Talks on phone: Not on file    Gets together: Not on file    Attends religious service: Not on file    Active member of club or organization: Not on file    Attends meetings of clubs or organizations: Not on file    Relationship status: Not on file  . Intimate partner violence:    Fear of current or ex partner: Not on file    Emotionally abused: Not on file    Physically abused: Not on file    Forced sexual activity: Not on file  Other Topics Concern  . Not on file  Social History Narrative   2 caffeine drinks daily    Family History  Problem Relation Age of Onset  . Heart disease Mother   . Heart attack Father    . Diabetes Unknown   . Crohn's disease Brother   . Crohn's disease Brother   . Crohn's disease Brother   . Colon cancer Neg Hx    Past Surgical History:  Procedure Laterality Date  . adenosine cardillite  05/16/05   negative signif. myocardial ischemic  . CARDIAC CATHETERIZATION  10/04   s/p stent LV 45%  . CATARACT EXTRACTION     bilateral  . CESAREAN SECTION  84, 86   ectopic pregnancy  . CHOLECYSTECTOMY  1998  . CORONARY ANGIOPLASTY WITH STENT PLACEMENT  10/04  . PARTIAL HYSTERECTOMY       Vanessa Kick, MD 02/16/18 4712    Vanessa Kick, MD 02/16/18 5271    Vanessa Kick, MD 02/16/18 [phone]

## 2018-02-16 NOTE — ED Triage Notes (Signed)
Pt cc she has a spider bite on the back of her left leg.. X 3 days.

## 2018-02-22 ENCOUNTER — Ambulatory Visit (HOSPITAL_COMMUNITY)
Admission: EM | Admit: 2018-02-22 | Discharge: 2018-02-22 | Disposition: A | Discharge status: Home / Self Care | Payer: Medicare HMO | Attending: Internal Medicine | Admitting: Internal Medicine

## 2018-02-22 ENCOUNTER — Encounter (HOSPITAL_COMMUNITY): Payer: Self-pay | Admitting: Emergency Medicine

## 2018-02-22 DIAGNOSIS — J209 Acute bronchitis, unspecified: Secondary | ICD-10-CM

## 2018-02-22 DIAGNOSIS — J9801 Acute bronchospasm: Secondary | ICD-10-CM

## 2018-02-22 DIAGNOSIS — J208 Acute bronchitis due to other specified organisms: Secondary | ICD-10-CM

## 2018-02-22 MED ORDER — BENZONATATE 200 MG PO CAPS: 200.0000 mg | ORAL_CAPSULE | Freq: Three times a day (TID) | ORAL | 0 refills | Status: DC | PRN

## 2018-02-22 MED ORDER — ALBUTEROL SULFATE HFA 108 (90 BASE) MCG/ACT IN AERS: 1.0000 | INHALATION_SPRAY | RESPIRATORY_TRACT | 0 refills | Status: AC | PRN

## 2018-02-22 MED ORDER — IPRATROPIUM-ALBUTEROL 0.5-2.5 (3) MG/3ML IN SOLN
3.0000 mL | Freq: Once | RESPIRATORY_TRACT | Status: AC
Start: 2018-02-22 — End: 2018-02-22
  Administered 2018-02-22: 3 mL via RESPIRATORY_TRACT

## 2018-02-22 NOTE — ED Provider Notes (Signed)
[place]    CSN: [id number] Arrival date & time: 02/22/18  1536     History   Chief Complaint Chief Complaint  Patient presents with  . Cough  . Wheezing    HPI Evelyn Morrison is a 77 y.o. female.   She presents today with 4 to 5 days history of cough, with some wheezing.  No fever, no cough production.  Her cough and wheezing reminded a family member of bronchitis and asthma, and he just wanted her checked out.    HPI  Past Medical History:  Diagnosis Date  . Anemia   . Arthritis   . CAD (coronary artery disease)    Myoview 12/17: EF 66, apical and apical lateral defect - likely attenuation artifact, no ischemia; Low Risk // Myoview 3/19:  EF 74, no ischemia or scar; low risk   . Cataract, diabetic ([place])   . Chest pain   . Constipation   . Contusion of arm   . Diabetes mellitus   . Diabetic retinopathy   . DM (diabetes mellitus) ([place])   . Esophageal stricture   . Fatigue   . Glaucoma   . Heart burn   . Hiatal hernia   . History of echocardiogram    Echo 3/19:  Mild LVH, EF 60-65, no RWMA, Gr 1 DD, PASP 34  . HLD (hyperlipidemia)   . Hypertension   . Joint stiffness of hand   . Urinary incontinence   . Vitamin D deficiency     Patient Active Problem List   Diagnosis Date Noted  . Dysphagia, unspecified(787.20) 10/23/2010  . Abdominal pain, unspecified site 10/23/2010  . Stricture esophagus 10/23/2010  . Irritable bowel syndrome (IBS) 10/23/2010  . Constipation, chronic 10/23/2010  . Syncope 10/16/2010  . GERD (gastroesophageal reflux disease) 09/11/2010  . Chest pain, unspecified 09/11/2010  . ABSCESS 05/23/2010  . ACUTE CYSTITIS 05/31/2009  . SHOULDER PAIN, LEFT 05/31/2009  . POSTMENOPAUSAL STATUS 05/31/2009  . CONSTIPATION 11/03/2008  . CONTUSION, ARM 07/20/2008  . JOINT STIFFNESS, HAND 06/15/2008  . URINARY INCONTINENCE 06/15/2008  . VITAMIN D DEFICIENCY 03/20/2008  . ARTHRITIS 03/14/2008  . ANEMIA 03/08/2008  . Type 2  diabetes mellitus without complication, without long-term current use of insulin ([place]) 12/11/2006  . DIABETIC  RETINOPATHY 12/11/2006  . HLD (hyperlipidemia) 12/11/2006  . CATARACT, DIABETIC 12/11/2006  . Essential hypertension 12/11/2006  . Coronary artery disease involving native coronary artery of native heart without angina pectoris 12/11/2006  . FATIGUE 12/11/2006    Past Surgical History:  Procedure Laterality Date  . adenosine cardillite  05/16/05   negative signif. myocardial ischemic  . CARDIAC CATHETERIZATION  10/04   s/p stent LV 45%  . CATARACT EXTRACTION     bilateral  . CESAREAN SECTION  84, 86   ectopic pregnancy  . CHOLECYSTECTOMY  1998  . CORONARY ANGIOPLASTY WITH STENT PLACEMENT  10/04  . PARTIAL HYSTERECTOMY       Home Medications    Prior to Admission medications   Medication Sig Start Date End Date Taking? Authorizing Provider  acetaminophen (TYLENOL) 500 MG tablet Take 500-1,000 mg by mouth every 6 (six) hours as needed (for pain).    [provider]  albuterol (PROVENTIL HFA;VENTOLIN HFA) 108 (90 Base) MCG/ACT inhaler Inhale 1-2 puffs into the lungs every 4 (four) hours as needed for wheezing or shortness of breath. 02/22/18   Wynona Luna, MD  amLODipine (NORVASC) 10 MG tablet Take 10 mg by mouth every morning.  02/06/15  [provider]  aspirin 81 MG EC tablet Take 81 mg by mouth every morning.     [provider]  benzonatate (TESSALON) 200 MG capsule Take 1 capsule (200 mg total) by mouth 3 (three) times daily as needed for cough. 02/22/18   Wynona Luna, MD  Calcium Carbonate-Vitamin D (CALTRATE 600+D) 600-400 MG-UNIT per tablet Take 1 tablet by mouth daily.     [provider]  escitalopram (LEXAPRO) 20 MG tablet Take 20 mg by mouth daily. 01/23/17   [provider]  gabapentin (NEURONTIN) 100 MG capsule Take 100 mg by mouth daily. 01/23/15   [provider]  gabapentin (NEURONTIN)  300 MG capsule Take 300 mg by mouth 2 (two) times daily.    [provider]  lisinopril-hydrochlorothiazide (PRINZIDE,ZESTORETIC) 20-12.5 MG tablet Take 1 tablet by mouth daily.    [provider]  lovastatin (MEVACOR) 20 MG tablet Take 1 tablet (20 mg total) by mouth at bedtime. 04/28/17   Richardson Dopp T, PA-C  meloxicam (MOBIC) 15 MG tablet Take 15 mg by mouth daily. 03/03/17   [provider]  metFORMIN (GLUCOPHAGE) 500 MG tablet Take 500 mg by mouth daily with breakfast.      [provider]  Multiple Vitamin (MULTIVITAMIN) tablet Take 1 tablet by mouth daily.      [provider]  nitroGLYCERIN (NITROSTAT) 0.4 MG SL tablet Place 0.4 mg under the tongue every 5 (five) minutes as needed for chest pain (3 doses max).     [provider]  senna (SENOKOT) 8.6 MG tablet Take 2 tablets by mouth daily as needed for constipation.    [provider]  sulfamethoxazole-trimethoprim (BACTRIM DS,SEPTRA DS) 800-160 MG tablet Take 1 tablet by mouth 2 (two) times daily for 10 days. 02/16/18 02/26/18  Vanessa Kick, MD    Family History Family History  Problem Relation Age of Onset  . Heart disease Mother   . Heart attack Father   . Diabetes Unknown   . Crohn's disease Brother   . Crohn's disease Brother   . Crohn's disease Brother   . Colon cancer Neg Hx     Social History Social History   Tobacco Use  . Smoking status: Former Smoker    Last attempt to quit: 03/31/1997    Years since quitting: 20.9  . Smokeless tobacco: Never Used  Substance Use Topics  . Alcohol use: No    Comment: ocassional  . Drug use: No     Allergies   Penicillins; Ibuprofen; Lactose intolerance (gi); Milk-related compounds; Nyquil multi-symptom [pseudoeph-doxylamine-dm-apap]; and Latex   Review of Systems Review of Systems  All other systems reviewed and are negative.    Physical Exam Triage Vital Signs ED Triage Vitals  Enc Vitals Group     BP  02/22/18 1550 101/65     Pulse Rate 02/22/18 1550 92     Resp 02/22/18 1550 (!) 22     Temp 02/22/18 1550 98.2 F (36.8 C)     Temp src --      SpO2 02/22/18 1550 97 %     Weight --      Height --      Pain Score 02/22/18 1553 0     Pain Loc --      Pain Edu? --    Updated Vital Signs BP 101/65   Pulse 92   Temp 98.2 F (36.8 C)   Resp (!) 22   SpO2 97%  Physical Exam  Constitutional: She is oriented to person, place, and time. No distress.  HENT:  Head: Atraumatic.  Eyes:  Conjugate gaze observed, no eye redness/discharge  Neck: Neck supple.  Cardiovascular: Normal rate and regular rhythm.  Pulmonary/Chest: No respiratory distress.  Coarse but symmetric breath sounds throughout   Abdominal: She exhibits no distension.  Musculoskeletal: Normal range of motion.  Neurological: She is alert and oriented to person, place, and time.  Skin: Skin is warm and dry.  Nursing note and vitals reviewed.    UC Treatments / Results   Procedures Procedures (including critical care time)  Medications Ordered in UC Medications  ipratropium-albuterol (DUONEB) 0.5-2.5 (3) MG/3ML nebulizer solution 3 mL (3 mLs Nebulization Given 02/22/18 1739)   Marked improvement in symptoms after breathing treatment.    Final Clinical Impressions(s) / UC Diagnoses   Final diagnoses:  Acute bronchitis with bronchospasm     Discharge Instructions     No danger signs on exam today.  Anticipate gradual improvement in cough/wheezing over the next week or two.  Breathing treatment with albuterol was given at the urgent care today with improvement in symptoms.  Prescriptions for albuterol inhaler with spacer and some benzonatate cough perles were sent to the pharmacy.  Recheck for new fever >100.5, increasing phlegm production/nasal discharge, or if not starting to improve in a few days.      ED Prescriptions    Medication Sig Dispense Auth. Provider   albuterol (PROVENTIL HFA;VENTOLIN HFA) 108  (90 Base) MCG/ACT inhaler Inhale 1-2 puffs into the lungs every 4 (four) hours as needed for wheezing or shortness of breath. 1 Inhaler Wynona Luna, MD   benzonatate (TESSALON) 200 MG capsule Take 1 capsule (200 mg total) by mouth 3 (three) times daily as needed for cough. 30 capsule Wynona Luna, MD        Wynona Luna, MD 02/25/18 [phone]

## 2018-02-22 NOTE — Discharge Instructions (Addendum)
No danger signs on exam today.  Anticipate gradual improvement in cough/wheezing over the next week or two.  Breathing treatment with albuterol was given at the urgent care today with improvement in symptoms.  Prescriptions for albuterol inhaler with spacer and some benzonatate cough perles were sent to the pharmacy.  Recheck for new fever >100.5, increasing phlegm production/nasal discharge, or if not starting to improve in a few days.

## 2018-02-22 NOTE — ED Triage Notes (Signed)
Pt states "ive been coughing for a while, i've been taking tylenol for a fever, and i've had congestion and I think I got rid of most of that but i've still coughing." pt states feels a "whistle" in her chest. Denies pain.

## 2018-05-28 ENCOUNTER — Other Ambulatory Visit: Payer: Self-pay | Admitting: Physician Assistant

## 2018-06-11 ENCOUNTER — Other Ambulatory Visit: Payer: Self-pay | Admitting: Physician Assistant

## 2018-06-15 ENCOUNTER — Other Ambulatory Visit: Payer: Self-pay | Admitting: Physician Assistant

## 2018-06-16 ENCOUNTER — Other Ambulatory Visit: Payer: Self-pay

## 2018-06-16 MED ORDER — LOVASTATIN 20 MG PO TABS: 20.0000 mg | ORAL_TABLET | Freq: Every day | ORAL | 0 refills | Status: DC

## 2018-06-28 ENCOUNTER — Other Ambulatory Visit: Payer: Self-pay

## 2018-06-28 ENCOUNTER — Emergency Department: Payer: Medicare HMO

## 2018-06-28 ENCOUNTER — Encounter: Payer: Self-pay | Admitting: Emergency Medicine

## 2018-06-28 ENCOUNTER — Emergency Department
Admission: EM | Admit: 2018-06-28 | Discharge: 2018-06-28 | Disposition: A | Discharge status: Home / Self Care | Payer: Medicare HMO | Attending: Emergency Medicine | Admitting: Emergency Medicine

## 2018-06-28 DIAGNOSIS — Z79899 Other long term (current) drug therapy: Secondary | ICD-10-CM | POA: Insufficient documentation

## 2018-06-28 DIAGNOSIS — E119 Type 2 diabetes mellitus without complications: Secondary | ICD-10-CM | POA: Diagnosis not present

## 2018-06-28 DIAGNOSIS — I2510 Atherosclerotic heart disease of native coronary artery without angina pectoris: Secondary | ICD-10-CM | POA: Diagnosis not present

## 2018-06-28 DIAGNOSIS — Z7984 Long term (current) use of oral hypoglycemic drugs: Secondary | ICD-10-CM | POA: Insufficient documentation

## 2018-06-28 DIAGNOSIS — I10 Essential (primary) hypertension: Secondary | ICD-10-CM | POA: Insufficient documentation

## 2018-06-28 DIAGNOSIS — Z7982 Long term (current) use of aspirin: Secondary | ICD-10-CM | POA: Diagnosis not present

## 2018-06-28 DIAGNOSIS — R531 Weakness: Secondary | ICD-10-CM | POA: Insufficient documentation

## 2018-06-28 DIAGNOSIS — R55 Syncope and collapse: Secondary | ICD-10-CM | POA: Diagnosis present

## 2018-06-28 DIAGNOSIS — Z87891 Personal history of nicotine dependence: Secondary | ICD-10-CM | POA: Insufficient documentation

## 2018-06-28 LAB — CBC
HCT: 40.7 % (ref 36.0–46.0)
Hemoglobin: 12.8 g/dL (ref 12.0–15.0)
MCH: 29.6 pg (ref 26.0–34.0)
MCHC: 31.4 g/dL (ref 30.0–36.0)
MCV: 94 fL (ref 80.0–100.0)
Platelets: 137 10*3/uL — ABNORMAL LOW (ref 150–400)
RBC: 4.33 MIL/uL (ref 3.87–5.11)
RDW: 13.5 % (ref 11.5–15.5)
WBC: 5.8 10*3/uL (ref 4.0–10.5)
nRBC: 0 % (ref 0.0–0.2)

## 2018-06-28 LAB — URINALYSIS, COMPLETE (UACMP) WITH MICROSCOPIC
Bilirubin Urine: NEGATIVE
GLUCOSE, UA: NEGATIVE mg/dL
Ketones, ur: NEGATIVE mg/dL
Nitrite: NEGATIVE
Protein, ur: NEGATIVE mg/dL
Specific Gravity, Urine: 1.013 (ref 1.005–1.030)
pH: 5 (ref 5.0–8.0)

## 2018-06-28 LAB — BASIC METABOLIC PANEL
Anion gap: 14 (ref 5–15)
BUN: 33 mg/dL — ABNORMAL HIGH (ref 8–23)
CO2: 21 mmol/L — ABNORMAL LOW (ref 22–32)
Calcium: 8.9 mg/dL (ref 8.9–10.3)
Chloride: 102 mmol/L (ref 98–111)
Creatinine, Ser: 1.93 mg/dL — ABNORMAL HIGH (ref 0.44–1.00)
GFR calc Af Amer: 29 mL/min — ABNORMAL LOW (ref >60)
GFR calc non Af Amer: 25 mL/min — ABNORMAL LOW (ref >60)
Glucose, Bld: 168 mg/dL — ABNORMAL HIGH (ref 70–99)
Potassium: 4.8 mmol/L (ref 3.5–5.1)
SODIUM: 137 mmol/L (ref 135–145)

## 2018-06-28 LAB — TROPONIN I
Troponin I: <0.03 ng/mL (ref <0.03)
Troponin I: <0.03 ng/mL (ref <0.03)

## 2018-06-28 LAB — TSH: TSH: 1.084 u[IU]/mL (ref 0.350–4.500)

## 2018-06-28 LAB — GLUCOSE, CAPILLARY: Glucose-Capillary: 143 mg/dL — ABNORMAL HIGH (ref 70–99)

## 2018-06-28 MED ORDER — SODIUM CHLORIDE 0.9% FLUSH: 3.0000 mL | Freq: Once | INTRAVENOUS | Status: DC

## 2018-06-28 MED ORDER — SODIUM CHLORIDE 0.9 % IV BOLUS
1000.0000 mL | Freq: Once | INTRAVENOUS | Status: AC
  Administered 2018-06-28: 1000 mL via INTRAVENOUS

## 2018-06-28 NOTE — ED Notes (Signed)
Pt daughter called to inform RN that she is currently being "tested for corona at the tent outside". Pt continues to deny cough, SOB, fever.

## 2018-06-28 NOTE — ED Provider Notes (Signed)
[facility] Emergency Department Provider Note  ____________________________________________   I have reviewed the triage vital signs and the nursing notes.   HISTORY  Chief Complaint Syncope  History limited by: Not Limited   HPI Evelyn Morrison is a 78 y.o. female who presents to the emergency department today because of concern for a syncopal episode. The patient states that it occurred this afternoon. She went to take a shower and passed out. She has been feeling weak for the past 2 months. She has been hesitant to see her primary care physician because of concern for catching coronavirus. She says that the weakness has gradually gotten worse. She does have an appointment scheduled with her kidney doctor. Today she states she passed out. Denies any chest pain or shortness of breath prior to passing out. She denies any fevers.   Records reviewed. Per medical record review patient has a history of anemia, CAD, constipation.   Past Medical History:  Diagnosis Date  . Anemia   . Arthritis   . CAD (coronary artery disease)    Myoview 12/17: EF 66, apical and apical lateral defect - likely attenuation artifact, no ischemia; Low Risk // Myoview 3/19:  EF 74, no ischemia or scar; low risk   . Cataract, diabetic ([place])   . Chest pain   . Constipation   . Contusion of arm   . Diabetes mellitus   . Diabetic retinopathy   . DM (diabetes mellitus) ([place])   . Esophageal stricture   . Fatigue   . Glaucoma   . Heart burn   . Hiatal hernia   . History of echocardiogram    Echo 3/19:  Mild LVH, EF 60-65, no RWMA, Gr 1 DD, PASP 34  . HLD (hyperlipidemia)   . Hypertension   . Joint stiffness of hand   . Urinary incontinence   . Vitamin D deficiency     Patient Active Problem List   Diagnosis Date Noted  . Dysphagia, unspecified(787.20) 10/23/2010  . Abdominal pain, unspecified site 10/23/2010  . Stricture esophagus 10/23/2010  . Irritable bowel syndrome (IBS)  10/23/2010  . Constipation, chronic 10/23/2010  . Syncope 10/16/2010  . GERD (gastroesophageal reflux disease) 09/11/2010  . Chest pain, unspecified 09/11/2010  . ABSCESS 05/23/2010  . ACUTE CYSTITIS 05/31/2009  . SHOULDER PAIN, LEFT 05/31/2009  . POSTMENOPAUSAL STATUS 05/31/2009  . CONSTIPATION 11/03/2008  . CONTUSION, ARM 07/20/2008  . JOINT STIFFNESS, HAND 06/15/2008  . URINARY INCONTINENCE 06/15/2008  . VITAMIN D DEFICIENCY 03/20/2008  . ARTHRITIS 03/14/2008  . ANEMIA 03/08/2008  . Type 2 diabetes mellitus without complication, without long-term current use of insulin ([place]) 12/11/2006  . DIABETIC  RETINOPATHY 12/11/2006  . HLD (hyperlipidemia) 12/11/2006  . CATARACT, DIABETIC 12/11/2006  . Essential hypertension 12/11/2006  . Coronary artery disease involving native coronary artery of native heart without angina pectoris 12/11/2006  . FATIGUE 12/11/2006    Past Surgical History:  Procedure Laterality Date  . adenosine cardillite  05/16/05   negative signif. myocardial ischemic  . CARDIAC CATHETERIZATION  10/04   s/p stent LV 45%  . CATARACT EXTRACTION     bilateral  . CESAREAN SECTION  84, 86   ectopic pregnancy  . CHOLECYSTECTOMY  1998  . CORONARY ANGIOPLASTY WITH STENT PLACEMENT  10/04  . PARTIAL HYSTERECTOMY      Prior to Admission medications   Medication Sig Start Date End Date Taking? Authorizing Provider  acetaminophen (TYLENOL) 500 MG tablet Take 500-1,000 mg by mouth every 6 (  six) hours as needed (for pain).    [provider]  albuterol (PROVENTIL HFA;VENTOLIN HFA) 108 (90 Base) MCG/ACT inhaler Inhale 1-2 puffs into the lungs every 4 (four) hours as needed for wheezing or shortness of breath. 02/22/18   Wynona Luna, MD  amLODipine (NORVASC) 10 MG tablet Take 10 mg by mouth every morning.  02/06/15   [provider]  aspirin 81 MG EC tablet Take 81 mg by mouth every morning.     [provider]  benzonatate (TESSALON) 200 MG  capsule Take 1 capsule (200 mg total) by mouth 3 (three) times daily as needed for cough. 02/22/18   Wynona Luna, MD  Calcium Carbonate-Vitamin D (CALTRATE 600+D) 600-400 MG-UNIT per tablet Take 1 tablet by mouth daily.     [provider]  escitalopram (LEXAPRO) 20 MG tablet Take 20 mg by mouth daily. 01/23/17   [provider]  gabapentin (NEURONTIN) 100 MG capsule Take 100 mg by mouth daily. 01/23/15   [provider]  gabapentin (NEURONTIN) 300 MG capsule Take 300 mg by mouth 2 (two) times daily.    [provider]  lisinopril-hydrochlorothiazide (PRINZIDE,ZESTORETIC) 20-12.5 MG tablet Take 1 tablet by mouth daily.    [provider]  lovastatin (MEVACOR) 20 MG tablet Take 1 tablet (20 mg total) by mouth daily at 6 PM. 06/16/18   Kathlen Mody, Nicki Reaper T, PA-C  meloxicam (MOBIC) 15 MG tablet Take 15 mg by mouth daily. 03/03/17   [provider]  metFORMIN (GLUCOPHAGE) 500 MG tablet Take 500 mg by mouth daily with breakfast.      [provider]  Multiple Vitamin (MULTIVITAMIN) tablet Take 1 tablet by mouth daily.      [provider]  nitroGLYCERIN (NITROSTAT) 0.4 MG SL tablet Place 0.4 mg under the tongue every 5 (five) minutes as needed for chest pain (3 doses max).     [provider]  senna (SENOKOT) 8.6 MG tablet Take 2 tablets by mouth daily as needed for constipation.    [provider]    Allergies Penicillins; Ibuprofen; Lactose intolerance (gi); Milk-related compounds; Nyquil multi-symptom [pseudoeph-doxylamine-dm-apap]; and Latex  Family History  Problem Relation Age of Onset  . Heart disease Mother   . Heart attack Father   . Diabetes Other   . Crohn's disease Brother   . Crohn's disease Brother   . Crohn's disease Brother   . Colon cancer Neg Hx     Social History Social History   Tobacco Use  . Smoking status: Former Smoker    Last attempt to quit: 03/31/1997    Years since  quitting: 21.2  . Smokeless tobacco: Never Used  Substance Use Topics  . Alcohol use: No    Comment: ocassional  . Drug use: No    Review of Systems Constitutional: No fever/chills. Positive for generalized weakness.  Eyes: No visual changes. ENT: No sore throat. Cardiovascular: Denies chest pain. Respiratory: Denies shortness of breath. Gastrointestinal: No abdominal pain.  No nausea, no vomiting.  No diarrhea.   Genitourinary: Negative for dysuria. Musculoskeletal: Negative for back pain. Skin: Negative for rash. Neurological: Negative for headaches, focal weakness or numbness.  ____________________________________________   PHYSICAL EXAM:  VITAL SIGNS: ED Triage Vitals  Enc Vitals Group     BP 06/28/18 1522 (!) 83/61     Pulse Rate 06/28/18 1520 84     Resp 06/28/18 1520 16     Temp 06/28/18 1520 98.3 F (36.8 C)  Temp Source 06/28/18 1520 Oral     SpO2 06/28/18 1520 96 %     Weight --      Height --      Head Circumference --      Peak Flow --      Pain Score 06/28/18 1520 9   Constitutional: Alert and oriented.  Eyes: Conjunctivae are normal.  ENT      Head: Normocephalic and atraumatic.      Nose: No congestion/rhinnorhea.      Mouth/Throat: Mucous membranes are moist.      Neck: No stridor. Hematological/Lymphatic/Immunilogical: No cervical lymphadenopathy. Cardiovascular: Normal rate, regular rhythm.  No murmurs, rubs, or gallops. Respiratory: Normal respiratory effort without tachypnea nor retractions. Breath sounds are clear and equal bilaterally. No wheezes/rales/rhonchi. Gastrointestinal: Soft and non tender. No rebound. No guarding.  Genitourinary: Deferred Musculoskeletal: Normal range of motion in all extremities. No lower extremity edema. Neurologic:  Normal speech and language. No gross focal neurologic deficits are appreciated.  Skin:  Skin is warm, dry and intact. No rash noted. Psychiatric: Mood and affect are normal. Speech and behavior  are normal. Patient exhibits appropriate insight and judgment.  ____________________________________________    LABS (pertinent positives/negatives)  Trop <0.03 TSH 1.084 BMP na 137, k 4.8, glu 168, cr 1.93 UA cloudy, small leukocytes, 6-10 rbc, 6-10 wbc, rare, squamous epithelial 21-50 CBC wnl except plt 137 ____________________________________________   EKG  I, Nance Pear, attending physician, personally viewed and interpreted this EKG  EKG Time: 1524 Rate: 81 Rhythm: normal sinus rhythm Axis: left axis deviation Intervals: qtc 415 QRS: LVH ST changes: no st elevation Impression: abnormal ekg  ____________________________________________    RADIOLOGY  CXR Bronchitic changes  __________________________________________   PROCEDURES  Procedures  ____________________________________________   INITIAL IMPRESSION / ASSESSMENT AND PLAN / ED COURSE  Pertinent labs & imaging results that were available during my care of the patient were reviewed by me and considered in my medical decision making (see chart for details).   Patient presented to the emergency department today because of concerns for weakness and fatigue and a near syncopal episode today.  Differential would be broad including anemia electrolyte abnormality arrhythmia ACS infection amongst other etiologies.  Patient's work-up here without any obvious etiology.  2 sets of troponin negative.  Patient's urine did have some white blood cells however many squamous cells and patient not complaining of any urinary symptoms.  Will send this for urine culture.  At this point think patient can follow-up with primary care.  Discussed findings with patient. ____________________________________________   FINAL CLINICAL IMPRESSION(S) / ED DIAGNOSES  Final diagnoses:  Weakness     Note: This dictation was prepared with Dragon dictation. Any transcriptional errors that result from this process are  unintentional     Nance Pear, MD 06/28/18 2154

## 2018-06-28 NOTE — ED Triage Notes (Signed)
PT from home with c/o syncopal episode that happened this am in shower.Denies any anticoags, unaware if she had head injury. PT states increased weakness x87months. PT appears fatigue. Pt A&OX4

## 2018-06-28 NOTE — Discharge Instructions (Addendum)
Please seek medical attention for any high fevers, chest pain, shortness of breath, change in behavior, persistent vomiting, bloody stool or any other new or concerning symptoms.  

## 2018-06-30 LAB — URINE CULTURE

## 2018-10-19 DIAGNOSIS — Z01021 Encounter for examination of eyes and vision following failed vision screening with abnormal findings: Secondary | ICD-10-CM | POA: Diagnosis not present

## 2018-10-19 DIAGNOSIS — I10 Essential (primary) hypertension: Secondary | ICD-10-CM | POA: Diagnosis not present

## 2018-10-19 DIAGNOSIS — Z5181 Encounter for therapeutic drug level monitoring: Secondary | ICD-10-CM | POA: Diagnosis not present

## 2018-10-19 DIAGNOSIS — Z0001 Encounter for general adult medical examination with abnormal findings: Secondary | ICD-10-CM | POA: Diagnosis not present

## 2018-10-19 DIAGNOSIS — Z131 Encounter for screening for diabetes mellitus: Secondary | ICD-10-CM | POA: Diagnosis not present

## 2018-10-19 DIAGNOSIS — G629 Polyneuropathy, unspecified: Secondary | ICD-10-CM | POA: Diagnosis not present

## 2018-10-19 DIAGNOSIS — Z1389 Encounter for screening for other disorder: Secondary | ICD-10-CM | POA: Diagnosis not present

## 2018-10-19 DIAGNOSIS — Z1329 Encounter for screening for other suspected endocrine disorder: Secondary | ICD-10-CM | POA: Diagnosis not present

## 2018-10-19 DIAGNOSIS — Z01118 Encounter for examination of ears and hearing with other abnormal findings: Secondary | ICD-10-CM | POA: Diagnosis not present

## 2018-10-19 DIAGNOSIS — I25118 Atherosclerotic heart disease of native coronary artery with other forms of angina pectoris: Secondary | ICD-10-CM | POA: Diagnosis not present

## 2018-10-19 DIAGNOSIS — E119 Type 2 diabetes mellitus without complications: Secondary | ICD-10-CM | POA: Diagnosis not present

## 2018-10-19 DIAGNOSIS — F339 Major depressive disorder, recurrent, unspecified: Secondary | ICD-10-CM | POA: Diagnosis not present

## 2018-11-17 DIAGNOSIS — I129 Hypertensive chronic kidney disease with stage 1 through stage 4 chronic kidney disease, or unspecified chronic kidney disease: Secondary | ICD-10-CM | POA: Diagnosis not present

## 2018-11-17 DIAGNOSIS — N189 Chronic kidney disease, unspecified: Secondary | ICD-10-CM | POA: Diagnosis not present

## 2018-11-17 DIAGNOSIS — E559 Vitamin D deficiency, unspecified: Secondary | ICD-10-CM | POA: Diagnosis not present

## 2018-11-17 DIAGNOSIS — N390 Urinary tract infection, site not specified: Secondary | ICD-10-CM | POA: Diagnosis not present

## 2018-11-17 DIAGNOSIS — D631 Anemia in chronic kidney disease: Secondary | ICD-10-CM | POA: Diagnosis not present

## 2018-11-17 DIAGNOSIS — N183 Chronic kidney disease, stage 3 (moderate): Secondary | ICD-10-CM | POA: Diagnosis not present

## 2018-12-03 DIAGNOSIS — I129 Hypertensive chronic kidney disease with stage 1 through stage 4 chronic kidney disease, or unspecified chronic kidney disease: Secondary | ICD-10-CM | POA: Diagnosis not present

## 2018-12-24 DIAGNOSIS — Q829 Congenital malformation of skin, unspecified: Secondary | ICD-10-CM | POA: Diagnosis not present

## 2018-12-24 DIAGNOSIS — G629 Polyneuropathy, unspecified: Secondary | ICD-10-CM | POA: Diagnosis not present

## 2018-12-24 DIAGNOSIS — E785 Hyperlipidemia, unspecified: Secondary | ICD-10-CM | POA: Diagnosis not present

## 2018-12-24 DIAGNOSIS — I25118 Atherosclerotic heart disease of native coronary artery with other forms of angina pectoris: Secondary | ICD-10-CM | POA: Diagnosis not present

## 2018-12-24 DIAGNOSIS — F339 Major depressive disorder, recurrent, unspecified: Secondary | ICD-10-CM | POA: Diagnosis not present

## 2018-12-24 DIAGNOSIS — I10 Essential (primary) hypertension: Secondary | ICD-10-CM | POA: Diagnosis not present

## 2018-12-24 DIAGNOSIS — E119 Type 2 diabetes mellitus without complications: Secondary | ICD-10-CM | POA: Diagnosis not present

## 2018-12-24 DIAGNOSIS — E669 Obesity, unspecified: Secondary | ICD-10-CM | POA: Diagnosis not present

## 2019-01-07 DIAGNOSIS — G629 Polyneuropathy, unspecified: Secondary | ICD-10-CM | POA: Diagnosis not present

## 2019-01-07 DIAGNOSIS — Q829 Congenital malformation of skin, unspecified: Secondary | ICD-10-CM | POA: Diagnosis not present

## 2019-01-07 DIAGNOSIS — E785 Hyperlipidemia, unspecified: Secondary | ICD-10-CM | POA: Diagnosis not present

## 2019-01-07 DIAGNOSIS — E669 Obesity, unspecified: Secondary | ICD-10-CM | POA: Diagnosis not present

## 2019-01-07 DIAGNOSIS — E119 Type 2 diabetes mellitus without complications: Secondary | ICD-10-CM | POA: Diagnosis not present

## 2019-01-07 DIAGNOSIS — I25118 Atherosclerotic heart disease of native coronary artery with other forms of angina pectoris: Secondary | ICD-10-CM | POA: Diagnosis not present

## 2019-01-07 DIAGNOSIS — I10 Essential (primary) hypertension: Secondary | ICD-10-CM | POA: Diagnosis not present

## 2019-01-07 DIAGNOSIS — F339 Major depressive disorder, recurrent, unspecified: Secondary | ICD-10-CM | POA: Diagnosis not present

## 2019-01-17 ENCOUNTER — Telehealth: Payer: Self-pay

## 2019-01-17 NOTE — Telephone Encounter (Signed)
Spoke with pt who states that she is agreeable to see Pecolia Ades on 10/21 at 2 pm. Daughter will be bringing her to the office. Pt verbalized understanding and thanked me for the call.

## 2019-01-19 ENCOUNTER — Other Ambulatory Visit: Payer: Self-pay

## 2019-01-19 ENCOUNTER — Encounter: Payer: Self-pay | Admitting: Cardiology

## 2019-01-19 ENCOUNTER — Ambulatory Visit (INDEPENDENT_AMBULATORY_CARE_PROVIDER_SITE_OTHER): Payer: Medicare HMO | Admitting: Cardiology

## 2019-01-19 VITALS — BP 120/78 | HR 88 | Ht 61.0 in | Wt 154.8 lb

## 2019-01-19 DIAGNOSIS — I25118 Atherosclerotic heart disease of native coronary artery with other forms of angina pectoris: Secondary | ICD-10-CM | POA: Diagnosis not present

## 2019-01-19 DIAGNOSIS — E785 Hyperlipidemia, unspecified: Secondary | ICD-10-CM

## 2019-01-19 DIAGNOSIS — M79604 Pain in right leg: Secondary | ICD-10-CM

## 2019-01-19 DIAGNOSIS — E119 Type 2 diabetes mellitus without complications: Secondary | ICD-10-CM

## 2019-01-19 DIAGNOSIS — M79605 Pain in left leg: Secondary | ICD-10-CM

## 2019-01-19 DIAGNOSIS — I10 Essential (primary) hypertension: Secondary | ICD-10-CM

## 2019-01-19 DIAGNOSIS — N184 Chronic kidney disease, stage 4 (severe): Secondary | ICD-10-CM

## 2019-01-19 MED ORDER — GLIPIZIDE 5 MG PO TABS: 2.5000 mg | ORAL_TABLET | Freq: Every day | ORAL | 3 refills | Status: DC

## 2019-01-19 NOTE — Progress Notes (Signed)
Cardiology Office Note:    Date:  01/19/2019   ID:  Evelyn Morrison, DOB [birth year], MRN [id number]  PCP:  Benito Mccreedy, MD  Cardiologist:  Sherren Mocha, MD  Referring MD: Benito Mccreedy, MD   Chief Complaint  Patient presents with   Follow-up   Coronary Artery Disease    History of Present Illness:    Evelyn Morrison is a 78 y.o. female with a past medical history significant for CAD s/p MI in 2004 with PCI at outside hospital.  She had a nuclear stress test in 2012 showing an EF of 59% with a small apical perfusion defect question ischemia versus shifting breast attenuation artifact.  The study was interpreted as low risk.  Medical therapy was recommended.  A stress echocardiogram in 2015 for shortness of breath showed normal LV function at rest and no ischemic changes during exertion.  She was noted to have some chest discomfort in 01/2016 and under went [facility] that was low risk and negative for ischemia.   The patient was seen in the ED on 06/28/2018 with complaints of weakness and syncope.  Her symptoms had been getting gradually worse but she was reluctant to seek medical care due to fear of catching coronavirus.  Troponins were negative.  She was hypotensive.  Patient was last seen in the office on 04/24/2017 by Richardson Dopp, PA and noted some chest discomfort, but had not been getting worse.  Felt this was similar to her previous angina.  She also had some shortness of breath with activity that seem to be stable.  An echocardiogram was ordered and done on 05/29/2017 and showed normal LV systolic function with EF 81-44%, grade 1 diastolic dysfunction.  Lexiscan Myoview was done on 05/29/2017 which showed no ischemia or infarction, normal EF.  Her husband has significant dementia and is now in a nursing home.   The patient is here today for follow-up.  She says that she is feeling reasonably well. She says that she was having low blood pressure about 2 months ago  and amlodipine was discontinued by her nephrologist. Her PCP also lowered hier statin dose. She denies any chest pain. She has some DOE when she carries laundry up and down stairs. Otherwise no significant dyspnea. She sleeps on a flat bed with 1-2 pillows. No swelling. She says that she is having pain and cramping in her hands, lower legs and feet. She sees that poster on PAD in the office and wonders if she has PAD.   She has also been having issues with low blood sugar. Her metformin was stopped by nephrology. She is now on glipizide. She is having to eat something sweet to get her blood sugar up and feels like eating so much is causing her to gain weight.    Cardiac studies   Lexiscan Myoview 05/29/2017 Study Highlights   Nuclear stress EF: 74%.  There was no ST segment deviation noted during stress.  The study is normal.  This is a low risk study.  The left ventricular ejection fraction is hyperdynamic (>65%).   Normal stress nuclear study with no ischemia or infarction; EF 74 with normal wall motion.     Echocardiogram 05/29/2017 Study Conclusions - Left ventricle: The cavity size was normal. Wall thickness was   increased in a pattern of mild LVH. Systolic function was normal.   The estimated ejection fraction was in the range of 60% to 65%.   Wall motion was normal; there were no regional wall motion  abnormalities. Doppler parameters are consistent with abnormal   left ventricular relaxation (grade 1 diastolic dysfunction). - Aortic valve: There was no stenosis. - Mitral valve: There was no significant regurgitation. - Right ventricle: The cavity size was normal. Systolic function   was normal. - Tricuspid valve: Peak RV-RA gradient (S): 31 mm Hg. - Pulmonary arteries: PA peak pressure: 34 mm Hg (S). - Inferior vena cava: The vessel was normal in size. The   respirophasic diameter changes were in the normal range (>= 50%),   consistent with normal central venous  pressure.  Impressions: - Normal LV size with mild LV hypertrophy. EF 60-65%. Normal RV   size and systolic function. No significant valvular   abnormalities.  Past Medical History:  Diagnosis Date   Anemia    Arthritis    CAD (coronary artery disease)    Myoview 12/17: EF 66, apical and apical lateral defect - likely attenuation artifact, no ischemia; Low Risk // Myoview 3/19:  EF 74, no ischemia or scar; low risk    Cataract, diabetic (HCC)    Chest pain    Constipation    Contusion of arm    Diabetes mellitus    Diabetic retinopathy    DM (diabetes mellitus) ([place])    Esophageal stricture    Fatigue    Glaucoma    Heart burn    Hiatal hernia    History of echocardiogram    Echo 3/19:  Mild LVH, EF 60-65, no RWMA, Gr 1 DD, PASP 34   HLD (hyperlipidemia)    Hypertension    Joint stiffness of hand    Urinary incontinence    Vitamin D deficiency     Past Surgical History:  Procedure Laterality Date   adenosine cardillite  05/16/05   negative signif. myocardial ischemic   CARDIAC CATHETERIZATION  10/04   s/p stent LV 45%   CATARACT EXTRACTION     bilateral   CESAREAN SECTION  84, 86   ectopic pregnancy   CHOLECYSTECTOMY  1998   CORONARY ANGIOPLASTY WITH STENT PLACEMENT  10/04   PARTIAL HYSTERECTOMY      Current Medications: Current Meds  Medication Sig   acetaminophen (TYLENOL) 500 MG tablet Take 500-1,000 mg by mouth every 6 (six) hours as needed (for pain).   albuterol (PROVENTIL HFA;VENTOLIN HFA) 108 (90 Base) MCG/ACT inhaler Inhale 1-2 puffs into the lungs every 4 (four) hours as needed for wheezing or shortness of breath.   aspirin 81 MG EC tablet Take 81 mg by mouth every morning.    benzonatate (TESSALON) 200 MG capsule Take 1 capsule (200 mg total) by mouth 3 (three) times daily as needed for cough.   Cholecalciferol (D3 HIGH POTENCY) 50 MCG (2000 UT) CAPS Take 1 capsule by mouth daily.   ferrous sulfate 325 (65 FE) MG  tablet Take 325 mg by mouth daily with breakfast.   gabapentin (NEURONTIN) 300 MG capsule Take 300 mg by mouth 2 (two) times daily.   glipiZIDE (GLUCOTROL) 5 MG tablet Take 0.5 tablets (2.5 mg total) by mouth daily.   lisinopril-hydrochlorothiazide (PRINZIDE,ZESTORETIC) 20-12.5 MG tablet Take 1 tablet by mouth daily.   lovastatin (MEVACOR) 10 MG tablet Take 10 mg by mouth at bedtime.   meloxicam (MOBIC) 15 MG tablet Take 15 mg by mouth daily.   Multiple Vitamin (MULTIVITAMIN) tablet Take 1 tablet by mouth daily.     nitroGLYCERIN (NITROSTAT) 0.4 MG SL tablet Place 0.4 mg under the tongue every 5 (five) minutes as needed for  chest pain (3 doses max).    senna (SENOKOT) 8.6 MG tablet Take 2 tablets by mouth daily as needed for constipation.   [DISCONTINUED] glipiZIDE (GLUCOTROL) 5 MG tablet Take 1 tablet by mouth daily.     Allergies:   Penicillins, Ibuprofen, Lactose intolerance (gi), Milk-related compounds, Nyquil multi-symptom [pseudoeph-doxylamine-dm-apap], and Latex   Social History   Socioeconomic History   Marital status: Married    Spouse name: Not on file   Number of children: 5    Years of education: Not on file   Highest education level: Not on file  Occupational History   Occupation: Education officer, museum    Comment: Retired  Scientist, product/process development strain: Not on file   Food insecurity    Worry: Not on file    Inability: Not on Lexicographer needs    Medical: Not on file    Non-medical: Not on file  Tobacco Use   Smoking status: Former Smoker    Quit date: 03/31/1997    Years since quitting: 21.8   Smokeless tobacco: Never Used  Substance and Sexual Activity   Alcohol use: No    Comment: ocassional   Drug use: No   Sexual activity: Not on file  Lifestyle   Physical activity    Days per week: Not on file    Minutes per session: Not on file   Stress: Not on file  Relationships   Social connections    Talks on phone: Not on  file    Gets together: Not on file    Attends religious service: Not on file    Active member of club or organization: Not on file    Attends meetings of clubs or organizations: Not on file    Relationship status: Not on file  Other Topics Concern   Not on file  Social History Narrative   2 caffeine drinks daily      Family History: The patient's family history includes Crohn's disease in her brother, brother, and brother; Diabetes in an other family member; Heart attack in her father; Heart disease in her mother. There is no history of Colon cancer. ROS:   Please see the history of present illness.     All other systems reviewed and are negative.   EKG:  EKG is not ordered today.   Recent Labs: 06/28/2018: BUN 33; Creatinine, Ser 1.93; Hemoglobin 12.8; Platelets 137; Potassium 4.8; Sodium 137; TSH 1.084   Recent Lipid Panel    Component Value Date/Time   CHOL 193 04/24/2017 1139   TRIG 56 04/24/2017 1139   HDL 86 04/24/2017 1139   CHOLHDL 2.2 04/24/2017 1139   CHOLHDL 2.3 03/10/2016 1008   VLDL 25 03/10/2016 1008   LDLCALC 96 04/24/2017 1139    Physical Exam:    VS:  BP 120/78    Pulse 88    Ht 5\' 1"  (1.549 m)    Wt 154 lb 12.8 oz (70.2 kg)    SpO2 98%    BMI 29.25 kg/m     Wt Readings from Last 6 Encounters:  01/19/19 154 lb 12.8 oz (70.2 kg)  02/16/18 148 lb (67.1 kg)  04/24/17 151 lb 3.2 oz (68.6 kg)  03/10/16 148 lb (67.1 kg)  02/26/16 148 lb (67.1 kg)  11/22/15 141 lb (64 kg)     Physical Exam  Constitutional: She is oriented to person, place, and time. She appears well-developed and well-nourished. No distress.  Cardiovascular: Normal rate, regular rhythm,  normal heart sounds and intact distal pulses. Exam reveals no gallop and no friction rub.  No murmur heard. Pulmonary/Chest: Effort normal and breath sounds normal. No respiratory distress. She has no wheezes. She has no rales.  Abdominal: Soft. Bowel sounds are normal.  Musculoskeletal: Normal range  of motion.        General: No edema.  Neurological: She is alert and oriented to person, place, and time.  Skin: Skin is warm and dry.  Psychiatric: She has a normal mood and affect. Her behavior is normal. Judgment and thought content normal.  Vitals reviewed.    ASSESSMENT:    1. Coronary artery disease of native artery of native heart with stable angina pectoris ([place])   2. Essential (primary) hypertension   3. Hyperlipidemia, unspecified hyperlipidemia type   4. Type 2 diabetes mellitus without complication, without long-term current use of insulin ([place])   5. CKD (chronic kidney disease) stage 4, GFR 15-29 ml/min (HCC)   6. Pain in both lower extremities    PLAN:    In order of problems listed above:  CAD s/p MI with PCI in 2004 -Patient often has intermittent mild chest discomfort and shortness of breath.  She has had multiple negative studies including normal Lexiscan Myoview in 05/2017. Currently no significant chest pain or shortness of breath.  -Patient continues on aspirin and statin  Essential hypertension -On amlodipine 10 mg, lisinopril-hydrochlorothiazide 20-12.5 mg  Hyperlipidemia -On lovastatin 20 mg daily  Type 2 diabetes -On glipizide, management per PCP -Pt reports frequent low blood sugars. Will decrease her glipizide and have her f/u with PCP. I will also send this note.   CKD stage 4 -Followed by nephrology at [facility], Dr. Carolin Sicks.   Leg pain, bilateral (also hand pain) -Likely diabetic neuropathy. Lower extremity dopplers were negative in 10/2015. Pt was a remote smoker having quit in 1992.    Medication Adjustments/Labs and Tests Ordered: Current medicines are reviewed at length with the patient today.  Concerns regarding medicines are outlined above. Labs and tests ordered and medication changes are outlined in the patient instructions below:  Patient Instructions  Medication Instructions:  DECREASE: Glipizide to 2.5 mg once a day   *If  you need a refill on your cardiac medications before your next appointment, please call your pharmacy*  Lab Work: None   If you have labs (blood work) drawn today and your tests are completely normal, you will receive your results only by:  [place] (if you have MyChart) OR  A paper copy in the mail If you have any lab test that is abnormal or we need to change your treatment, we will call you to review the results.  Testing/Procedures: None   Follow-Up: At [facility], you and your health needs are our priority.  As part of our continuing mission to provide you with exceptional heart care, we have created designated Provider Care Teams.  These Care Teams include your primary Cardiologist (physician) and Advanced Practice Providers (APPs -  Physician Assistants and Nurse Practitioners) who all work together to provide you with the care you need, when you need it.  Your next appointment:   12 months  The format for your next appointment:   Either In Person or Virtual  Provider:   You may see Sherren Mocha, MD or one of the following Advanced Practice Providers on your designated Care Team:    Richardson Dopp, PA-C  [place], [place]  Daune Perch, NP   Other Instructions  Lifestyle Modifications to Prevent and Treat Heart Disease -Recommend heart healthy/Mediterranean diet, with whole grains, fruits, vegetables, fish, lean meats, nuts, olive oil and avocado oil.  -Limit salt intake to less than 2000 mg per day.  -Recommend moderate walking, starting slowly with a few minutes and working up to 3-5 times/week for 30-50 minutes each session. Aim for at least 150 minutes.week.  -Recommend avoidance of tobacco products. Avoid excess alcohol. -Keep blood pressure well controlled, ideally less than 130/80.        Signed, Daune Perch, NP  01/19/2019 4:47 PM    [facility]

## 2019-01-19 NOTE — Patient Instructions (Addendum)
Medication Instructions:  DECREASE: Glipizide to 2.5 mg once a day   *If you need a refill on your cardiac medications before your next appointment, please call your pharmacy*  Lab Work: None   If you have labs (blood work) drawn today and your tests are completely normal, you will receive your results only by: [place] MyChart Message (if you have MyChart) OR . A paper copy in the mail If you have any lab test that is abnormal or we need to change your treatment, we will call you to review the results.  Testing/Procedures: None   Follow-Up: At [facility], you and your health needs are our priority.  As part of our continuing mission to provide you with exceptional heart care, we have created designated Provider Care Teams.  These Care Teams include your primary Cardiologist (physician) and Advanced Practice Providers (APPs -  Physician Assistants and Nurse Practitioners) who all work together to provide you with the care you need, when you need it.  Your next appointment:   12 months  The format for your next appointment:   Either In Person or Virtual  Provider:   You may see Sherren Mocha, MD or one of the following Advanced Practice Providers on your designated Care Team:    Richardson Dopp, PA-C  [place], [place]  Daune Perch, NP   Other Instructions  Lifestyle Modifications to Prevent and Treat Heart Disease -Recommend heart healthy/Mediterranean diet, with whole grains, fruits, vegetables, fish, lean meats, nuts, olive oil and avocado oil.  -Limit salt intake to less than 2000 mg per day.  -Recommend moderate walking, starting slowly with a few minutes and working up to 3-5 times/week for 30-50 minutes each session. Aim for at least 150 minutes.week.  -Recommend avoidance of tobacco products. Avoid excess alcohol. -Keep blood pressure well controlled, ideally less than 130/80.

## 2019-01-26 ENCOUNTER — Ambulatory Visit: Payer: Medicare HMO | Admitting: Cardiology

## 2019-03-01 DIAGNOSIS — D631 Anemia in chronic kidney disease: Secondary | ICD-10-CM | POA: Diagnosis not present

## 2019-03-01 DIAGNOSIS — E1122 Type 2 diabetes mellitus with diabetic chronic kidney disease: Secondary | ICD-10-CM | POA: Diagnosis not present

## 2019-03-01 DIAGNOSIS — N189 Chronic kidney disease, unspecified: Secondary | ICD-10-CM | POA: Diagnosis not present

## 2019-03-01 DIAGNOSIS — I129 Hypertensive chronic kidney disease with stage 1 through stage 4 chronic kidney disease, or unspecified chronic kidney disease: Secondary | ICD-10-CM | POA: Diagnosis not present

## 2019-03-01 DIAGNOSIS — N183 Chronic kidney disease, stage 3 unspecified: Secondary | ICD-10-CM | POA: Diagnosis not present

## 2019-03-01 DIAGNOSIS — Z23 Encounter for immunization: Secondary | ICD-10-CM | POA: Diagnosis not present

## 2019-03-01 DIAGNOSIS — E559 Vitamin D deficiency, unspecified: Secondary | ICD-10-CM | POA: Diagnosis not present

## 2019-03-10 DIAGNOSIS — L988 Other specified disorders of the skin and subcutaneous tissue: Secondary | ICD-10-CM | POA: Diagnosis not present

## 2019-05-09 DIAGNOSIS — Z1231 Encounter for screening mammogram for malignant neoplasm of breast: Secondary | ICD-10-CM | POA: Diagnosis not present

## 2019-05-30 DIAGNOSIS — R921 Mammographic calcification found on diagnostic imaging of breast: Secondary | ICD-10-CM | POA: Diagnosis not present

## 2019-06-08 ENCOUNTER — Other Ambulatory Visit: Payer: Self-pay | Admitting: Radiology

## 2019-06-08 DIAGNOSIS — D0511 Intraductal carcinoma in situ of right breast: Secondary | ICD-10-CM | POA: Diagnosis not present

## 2019-06-08 DIAGNOSIS — R921 Mammographic calcification found on diagnostic imaging of breast: Secondary | ICD-10-CM | POA: Diagnosis not present

## 2019-06-14 ENCOUNTER — Encounter: Payer: Self-pay | Admitting: *Deleted

## 2019-06-14 ENCOUNTER — Other Ambulatory Visit: Payer: Self-pay | Admitting: *Deleted

## 2019-06-14 ENCOUNTER — Telehealth: Payer: Self-pay | Admitting: Hematology

## 2019-06-14 DIAGNOSIS — D0511 Intraductal carcinoma in situ of right breast: Secondary | ICD-10-CM | POA: Insufficient documentation

## 2019-06-14 NOTE — Telephone Encounter (Signed)
Spoke with daughter to confirm afternoon appointment for patient, packet will be emailed to daughter

## 2019-06-15 ENCOUNTER — Encounter: Payer: Self-pay | Admitting: *Deleted

## 2019-06-22 ENCOUNTER — Encounter: Payer: Self-pay | Admitting: Licensed Clinical Social Worker

## 2019-06-22 ENCOUNTER — Inpatient Hospital Stay: Payer: Medicare HMO | Attending: Hematology | Admitting: Hematology

## 2019-06-22 ENCOUNTER — Ambulatory Visit: Payer: Medicare HMO | Admitting: Physical Therapy

## 2019-06-22 ENCOUNTER — Encounter: Payer: Self-pay | Admitting: Hematology

## 2019-06-22 ENCOUNTER — Ambulatory Visit (HOSPITAL_BASED_OUTPATIENT_CLINIC_OR_DEPARTMENT_OTHER): Payer: Medicare HMO | Admitting: Licensed Clinical Social Worker

## 2019-06-22 ENCOUNTER — Other Ambulatory Visit: Payer: Self-pay | Admitting: General Surgery

## 2019-06-22 ENCOUNTER — Ambulatory Visit
Admission: RE | Admit: 2019-06-22 | Discharge: 2019-06-22 | Disposition: A | Discharge status: Home / Self Care | Payer: Medicare HMO | Source: Ambulatory Visit | Attending: Radiation Oncology | Admitting: Radiation Oncology

## 2019-06-22 ENCOUNTER — Inpatient Hospital Stay: Payer: Medicare HMO

## 2019-06-22 ENCOUNTER — Other Ambulatory Visit: Payer: Self-pay

## 2019-06-22 VITALS — BP 115/79 | HR 88 | Temp 98.3°F | Resp 18 | Ht 61.0 in | Wt 154.9 lb

## 2019-06-22 DIAGNOSIS — E1122 Type 2 diabetes mellitus with diabetic chronic kidney disease: Secondary | ICD-10-CM | POA: Diagnosis not present

## 2019-06-22 DIAGNOSIS — E785 Hyperlipidemia, unspecified: Secondary | ICD-10-CM | POA: Diagnosis not present

## 2019-06-22 DIAGNOSIS — E1136 Type 2 diabetes mellitus with diabetic cataract: Secondary | ICD-10-CM | POA: Diagnosis not present

## 2019-06-22 DIAGNOSIS — I129 Hypertensive chronic kidney disease with stage 1 through stage 4 chronic kidney disease, or unspecified chronic kidney disease: Secondary | ICD-10-CM | POA: Diagnosis not present

## 2019-06-22 DIAGNOSIS — R252 Cramp and spasm: Secondary | ICD-10-CM | POA: Insufficient documentation

## 2019-06-22 DIAGNOSIS — Z79899 Other long term (current) drug therapy: Secondary | ICD-10-CM | POA: Diagnosis not present

## 2019-06-22 DIAGNOSIS — C50411 Malignant neoplasm of upper-outer quadrant of right female breast: Secondary | ICD-10-CM

## 2019-06-22 DIAGNOSIS — Z7984 Long term (current) use of oral hypoglycemic drugs: Secondary | ICD-10-CM | POA: Diagnosis not present

## 2019-06-22 DIAGNOSIS — Z8041 Family history of malignant neoplasm of ovary: Secondary | ICD-10-CM | POA: Diagnosis not present

## 2019-06-22 DIAGNOSIS — Z87891 Personal history of nicotine dependence: Secondary | ICD-10-CM | POA: Diagnosis not present

## 2019-06-22 DIAGNOSIS — D0511 Intraductal carcinoma in situ of right breast: Secondary | ICD-10-CM | POA: Diagnosis not present

## 2019-06-22 DIAGNOSIS — Z1379 Encounter for other screening for genetic and chromosomal anomalies: Secondary | ICD-10-CM | POA: Diagnosis not present

## 2019-06-22 DIAGNOSIS — F419 Anxiety disorder, unspecified: Secondary | ICD-10-CM | POA: Diagnosis not present

## 2019-06-22 DIAGNOSIS — I2510 Atherosclerotic heart disease of native coronary artery without angina pectoris: Secondary | ICD-10-CM | POA: Insufficient documentation

## 2019-06-22 DIAGNOSIS — Z791 Long term (current) use of non-steroidal anti-inflammatories (NSAID): Secondary | ICD-10-CM | POA: Insufficient documentation

## 2019-06-22 DIAGNOSIS — Z7982 Long term (current) use of aspirin: Secondary | ICD-10-CM | POA: Insufficient documentation

## 2019-06-22 DIAGNOSIS — E119 Type 2 diabetes mellitus without complications: Secondary | ICD-10-CM

## 2019-06-22 DIAGNOSIS — N644 Mastodynia: Secondary | ICD-10-CM | POA: Insufficient documentation

## 2019-06-22 DIAGNOSIS — Z171 Estrogen receptor negative status [ER-]: Secondary | ICD-10-CM

## 2019-06-22 DIAGNOSIS — I252 Old myocardial infarction: Secondary | ICD-10-CM | POA: Insufficient documentation

## 2019-06-22 DIAGNOSIS — M1990 Unspecified osteoarthritis, unspecified site: Secondary | ICD-10-CM | POA: Diagnosis not present

## 2019-06-22 DIAGNOSIS — Z809 Family history of malignant neoplasm, unspecified: Secondary | ICD-10-CM | POA: Diagnosis not present

## 2019-06-22 LAB — CMP (CANCER CENTER ONLY)
ALT: 18 U/L (ref 0–44)
AST: 21 U/L (ref 15–41)
Albumin: 3.8 g/dL (ref 3.5–5.0)
Alkaline Phosphatase: 76 U/L (ref 38–126)
Anion gap: 9 (ref 5–15)
BUN: 21 mg/dL (ref 8–23)
CO2: 28 mmol/L (ref 22–32)
Calcium: 10 mg/dL (ref 8.9–10.3)
Chloride: 105 mmol/L (ref 98–111)
Creatinine: 1.48 mg/dL — ABNORMAL HIGH (ref 0.44–1.00)
GFR, Est AFR Am: 39 mL/min — ABNORMAL LOW (ref >60)
GFR, Estimated: 34 mL/min — ABNORMAL LOW (ref >60)
Glucose, Bld: 138 mg/dL — ABNORMAL HIGH (ref 70–99)
Potassium: 3.9 mmol/L (ref 3.5–5.1)
Sodium: 142 mmol/L (ref 135–145)
Total Bilirubin: 0.3 mg/dL (ref 0.3–1.2)
Total Protein: 8 g/dL (ref 6.5–8.1)

## 2019-06-22 LAB — CBC WITH DIFFERENTIAL (CANCER CENTER ONLY)
Abs Immature Granulocytes: 0.02 10*3/uL (ref 0.00–0.07)
Basophils Absolute: 0 10*3/uL (ref 0.0–0.1)
Basophils Relative: 1 %
Eosinophils Absolute: 0.2 10*3/uL (ref 0.0–0.5)
Eosinophils Relative: 3 %
HCT: 39.9 % (ref 36.0–46.0)
Hemoglobin: 12.8 g/dL (ref 12.0–15.0)
Immature Granulocytes: 0 %
Lymphocytes Relative: 34 %
Lymphs Abs: 1.9 10*3/uL (ref 0.7–4.0)
MCH: 29.8 pg (ref 26.0–34.0)
MCHC: 32.1 g/dL (ref 30.0–36.0)
MCV: 92.8 fL (ref 80.0–100.0)
Monocytes Absolute: 0.3 10*3/uL (ref 0.1–1.0)
Monocytes Relative: 6 %
Neutro Abs: 3.2 10*3/uL (ref 1.7–7.7)
Neutrophils Relative %: 56 %
Platelet Count: 194 10*3/uL (ref 150–400)
RBC: 4.3 MIL/uL (ref 3.87–5.11)
RDW: 13.9 % (ref 11.5–15.5)
WBC Count: 5.6 10*3/uL (ref 4.0–10.5)
nRBC: 0 % (ref 0.0–0.2)

## 2019-06-22 LAB — GENETIC SCREENING ORDER

## 2019-06-22 NOTE — Progress Notes (Signed)
Radiation Oncology         [phone] ________________________________  Multidisciplinary Breast Oncology Clinic [facility]) Initial Outpatient Consultation  Name: Evelyn Morrison MRN: [id number]  Date: 06/22/2019  DOB: 1941/01/01  [id number], Iona Beard, MD  Benito Mccreedy, MD   REFERRING PHYSICIAN: Benito Mccreedy, MD  DIAGNOSIS: The encounter diagnosis was Ductal carcinoma in situ (DCIS) of right breast.  Stage 0 Right Breast UOQ, DCIS, ER- / PR-, Grade 2-3    ICD-10-CM   1. Ductal carcinoma in situ (DCIS) of right breast  D05.11     HISTORY OF PRESENT ILLNESS::Evelyn Morrison is a 79 y.o. female who is presenting to the office today for evaluation of her newly diagnosed breast cancer. She is accompanied by her daughter. She is doing well overall.   She had routine screening mammography on 05/09/2019 showing a possible abnormality in the right breast. She underwent right diagnostic mammography with tomography at [facility] on 05/30/2019 showing: 5 mm calcifications in the upper-outer right breast.  Biopsy on 06/08/2019 showed: DCIS, intermediate to high grade. Prognostic indicators significant for: estrogen receptor, 0% negative and progesterone receptor, 0% negative.  Menarche: 79 years old Age at first live birth: 79 years old GP: 5 LMP: 06/1982 Contraceptive: yes, used in 1971, 1973-1981 HRT: no   The patient was referred today for presentation in the multidisciplinary conference.  Radiology studies and pathology slides were presented there for review and discussion of treatment options.  A consensus was discussed regarding potential next steps.  PREVIOUS RADIATION THERAPY: No  PAST MEDICAL HISTORY:  Past Medical History:  Diagnosis Date  . Anemia   . Arthritis   . CAD (coronary artery disease)    Myoview 12/17: EF 66, apical and apical lateral defect - likely attenuation artifact, no ischemia; Low Risk // Myoview 3/19:  EF 74, no ischemia or scar; low risk   . Cataract,  diabetic ([place])   . Chest pain   . Constipation   . Contusion of arm   . Diabetes mellitus   . Diabetic retinopathy   . DM (diabetes mellitus) ([place])   . Esophageal stricture   . Family history of ovarian cancer   . Fatigue   . Glaucoma   . Heart burn   . Hiatal hernia   . History of echocardiogram    Echo 3/19:  Mild LVH, EF 60-65, no RWMA, Gr 1 DD, PASP 34  . HLD (hyperlipidemia)   . Hypertension   . Joint stiffness of hand   . Urinary incontinence   . Vitamin D deficiency     PAST SURGICAL HISTORY: Past Surgical History:  Procedure Laterality Date  . adenosine cardillite  05/16/05   negative signif. myocardial ischemic  . CARDIAC CATHETERIZATION  10/04   s/p stent LV 45%  . CATARACT EXTRACTION     bilateral  . CESAREAN SECTION  84, 86   ectopic pregnancy  . CHOLECYSTECTOMY  1998  . CORONARY ANGIOPLASTY WITH STENT PLACEMENT  10/04  . PARTIAL HYSTERECTOMY      FAMILY HISTORY:  Family History  Problem Relation Age of Onset  . Heart disease Mother   . Heart attack Father   . Diabetes Other   . Crohn's disease Brother   . Crohn's disease Brother   . Crohn's disease Brother   . Ovarian cancer Cousin 55  . Colon cancer Neg Hx     SOCIAL HISTORY:  Social History   Socioeconomic History  . Marital status: Married    Spouse name: Not on  file  . Number of children: 5   . Years of education: Not on file  . Highest education level: Not on file  Occupational History  . Occupation: Education officer, museum    Comment: Retired  Tobacco Use  . Smoking status: Former Smoker    Packs/day: 0.25    Years: 15.00    Pack years: 3.75    Quit date: 03/31/1990    Years since quitting: 29.2  . Smokeless tobacco: Never Used  Substance and Sexual Activity  . Alcohol use: No    Comment: ocassional  . Drug use: No  . Sexual activity: Not on file  Other Topics Concern  . Not on file  Social History Narrative   2 caffeine drinks daily    Social Determinants of Health   Financial  Resource Strain:   . Difficulty of Paying Living Expenses:   Food Insecurity:   . Worried About Charity fundraiser in the Last Year:   . Arboriculturist in the Last Year:   Transportation Needs:   . Film/video editor (Medical):   Marland Kitchen Lack of Transportation (Non-Medical):   Physical Activity:   . Days of Exercise per Week:   . Minutes of Exercise per Session:   Stress:   . Feeling of Stress :   Social Connections:   . Frequency of Communication with Friends and Family:   . Frequency of Social Gatherings with Friends and Family:   . Attends Religious Services:   . Active Member of Clubs or Organizations:   . Attends Archivist Meetings:   Marland Kitchen Marital Status:     ALLERGIES:  Allergies  Allergen Reactions  . Penicillins Hives, Palpitations and Other (See Comments)    Mouth foams, also  Has patient had a PCN reaction causing immediate rash, facial/tongue/throat swelling, SOB or lightheadedness with hypotension: Yes Has patient had a PCN reaction causing severe rash involving mucus membranes or skin necrosis: No Has patient had a PCN reaction that required hospitalization: No Has patient had a PCN reaction occurring within the last 10 years: No If all of the above answers are "NO", then may proceed with Cephalosporin use.   . Ibuprofen Other (See Comments)    Made the patient shake and reacts with some of her meds  . Lactose Intolerance (Gi) Other (See Comments)    Stomach pain  . Milk-Related Compounds Other (See Comments)    Causes a stomach ache  . Nyquil Multi-Symptom [Pseudoeph-Doxylamine-Dm-Apap] Other (See Comments)    Made the patient shake (tolerates Coricidin)  . Latex Rash    No "black" latex    MEDICATIONS:  Current Outpatient Medications  Medication Sig Dispense Refill  . acetaminophen (TYLENOL) 500 MG tablet Take 500-1,000 mg by mouth every 6 (six) hours as needed (for pain).    Marland Kitchen albuterol (PROVENTIL HFA;VENTOLIN HFA) 108 (90 Base) MCG/ACT inhaler  Inhale 1-2 puffs into the lungs every 4 (four) hours as needed for wheezing or shortness of breath. 1 Inhaler 0  . aspirin 81 MG EC tablet Take 81 mg by mouth every morning.     . benzonatate (TESSALON) 200 MG capsule Take 1 capsule (200 mg total) by mouth 3 (three) times daily as needed for cough. 30 capsule 0  . Cholecalciferol (D3 HIGH POTENCY) 50 MCG (2000 UT) CAPS Take 1 capsule by mouth daily.    . ferrous sulfate 325 (65 FE) MG tablet Take 325 mg by mouth daily with breakfast.    . gabapentin (  NEURONTIN) 300 MG capsule Take 300 mg by mouth 2 (two) times daily.    Marland Kitchen glipiZIDE (GLUCOTROL) 5 MG tablet Take 0.5 tablets (2.5 mg total) by mouth daily. 45 tablet 3  . lisinopril-hydrochlorothiazide (PRINZIDE,ZESTORETIC) 20-12.5 MG tablet Take 1 tablet by mouth daily.    Marland Kitchen lovastatin (MEVACOR) 10 MG tablet Take 10 mg by mouth at bedtime.    . meloxicam (MOBIC) 15 MG tablet Take 15 mg by mouth daily.  1  . Multiple Vitamin (MULTIVITAMIN) tablet Take 1 tablet by mouth daily.      Marland Kitchen MYRBETRIQ 25 MG TB24 tablet     . nitroGLYCERIN (NITROSTAT) 0.4 MG SL tablet Place 0.4 mg under the tongue every 5 (five) minutes as needed for chest pain (3 doses max).     Marland Kitchen senna (SENOKOT) 8.6 MG tablet Take 2 tablets by mouth daily as needed for constipation.     No current facility-administered medications for this encounter.    REVIEW OF SYSTEMS: A 10+ POINT REVIEW OF SYSTEMS WAS OBTAINED including neurology, dermatology, psychiatry, cardiac, respiratory, lymph, extremities, GI, GU, musculoskeletal, constitutional, reproductive, HEENT. On the provided form, she reports fatigue that causes her to sleep more, wearing glasses, runny nose, dental problems and wearing dentures, sore throat, sleeping with two pillows, poor appetite with diagnosis, abdominal pain, incontinence, kidney disease, rash, joint pain, arthritis, some difficulties walking, weakness and numbness, anxiety, diabetes, anemia, and swollen lymph nodes  sometimes. She denies any other symptoms.    PHYSICAL EXAM:  Vitals with BMI 06/22/2019  Height 5\' 1"   Weight 154 lbs 14 oz  BMI 42.35  Systolic 361  Diastolic 79  Pulse 88  Lungs are clear to auscultation bilaterally. Heart has regular rate and rhythm. No palpable cervical, supraclavicular, or axillary adenopathy. Abdomen soft, non-tender, normal bowel sounds. Breast: left breast with no palpable mass, nipple discharge, or bleeding. Right breast with biopsy site in upper-outer quadrant, and no palpable mass, nipple discharge or bleeding.   KPS = 100  100 - Normal; no complaints; no evidence of disease. 90   - Able to carry on normal activity; minor signs or symptoms of disease. 80   - Normal activity with effort; some signs or symptoms of disease. 48   - Cares for self; unable to carry on normal activity or to do active work. 60   - Requires occasional assistance, but is able to care for most of his personal needs. 50   - Requires considerable assistance and frequent medical care. 11   - Disabled; requires special care and assistance. 59   - Severely disabled; hospital admission is indicated although death not imminent. 63   - Very sick; hospital admission necessary; active supportive treatment necessary. 10   - Moribund; fatal processes progressing rapidly. 0     - Dead  Karnofsky DA, Abelmann Cowden, Craver LS and [facility] [phone]) The use of the nitrogen mustards in the palliative treatment of carcinoma: with particular reference to bronchogenic carcinoma Cancer 1 634-56  LABORATORY DATA:  Lab Results  Component Value Date   WBC 5.6 06/22/2019   HGB 12.8 06/22/2019   HCT 39.9 06/22/2019   MCV 92.8 06/22/2019   PLT 194 06/22/2019   Lab Results  Component Value Date   NA 142 06/22/2019   K 3.9 06/22/2019   CL 105 06/22/2019   CO2 28 06/22/2019   Lab Results  Component Value Date   ALT 18 06/22/2019   AST 21 06/22/2019   ALKPHOS 76 06/22/2019  BILITOT 0.3 06/22/2019     PULMONARY FUNCTION TEST:   Recent Review Flowsheet Data    There is no flowsheet data to display.      RADIOGRAPHY: No results found.    IMPRESSION: Stage 0 Right Breast UOQ, DCIS, ER- / PR-, Grade 2-3  Patient will be a good candidate for breast conservation with radiotherapy to the right breast. We discussed the general course of radiation, potential side effects, and toxicities with radiation and the patient is interested in this approach.   Given the  ER/PR negativity and grade, we would not recommend surgery alone in this situation.  PLAN:  1. Genetics 2. Right Lumpectomy 3. Adjuvant Radiation Therapy   ------------------------------------------------  Blair Promise, PhD, MD  This document serves as a record of services personally performed by Gery Pray, MD. It was created on his behalf by Wilburn Mylar, a trained medical scribe. The creation of this record is based on the scribe's personal observations and the provider's statements to them. This document has been checked and approved by the attending provider.

## 2019-06-22 NOTE — Progress Notes (Signed)
[place]   [phone] [phone] [place] Note   Patient Care Team: Benito Mccreedy, MD as PCP - General (Internal Medicine) Sherren Mocha, MD as PCP - Cardiology (Cardiology) Mauro Kaufmann, RN as Oncology Nurse Navigator Rockwell Germany, RN as Oncology Nurse Navigator [name], MD as Consulting Physician (General Surgery) Truitt Merle, MD as Consulting Physician (Hematology) Gery Pray, MD as Consulting Physician (Radiation Oncology)  Date of Service:  06/22/2019   CHIEF COMPLAINTS/PURPOSE OF CONSULTATION:  Newly Diagnosed Ductal carcinoma in situ (DCIS) of right breast   Oncology History Overview Note  Cancer Staging Ductal carcinoma in situ (DCIS) of right breast Staging form: Breast, AJCC 8th Edition - Clinical stage from 06/08/2019: Stage 0 (cTis (DCIS), cN0, cM0, ER-, PR-, HER2: Not Assessed) - Signed by Truitt Merle, MD on 06/21/2019    Ductal carcinoma in situ (DCIS) of right breast  05/30/2019 Mammogram   Diagnostic Mammogram  IMPRESSION The developing round calcifications in the upper outer aspect posterior depth right breast  measuring 0.6cm are suspicious for malignancy. A biopsy is recommended.    06/08/2019 Cancer Staging   Staging form: Breast, AJCC 8th Edition - Clinical stage from 06/08/2019: Stage 0 (cTis (DCIS), cN0, cM0, ER-, PR-, HER2: Not Assessed) - Signed by Truitt Merle, MD on 06/21/2019   06/08/2019 Initial Biopsy   Diagnosis Breast, right, needle core biopsy, calcifications - DUCTAL CARCINOMA IN SITU. Microscopic Comment The DCIS is of intermediate to high nuclear grade with central necrosis and calcifications. Immunohistochemistry for p63, Calponin and SMM-1 demonstrates the presence of myoepithelium; a distinct invasive component is not identified in the submitted material. Ancillary studies will be reported separately. Results reported to [facility] on 06/09/2019.   06/08/2019  Receptors her2   PROGNOSTIC INDICATORS Results: IMMUNOHISTOCHEMICAL AND MORPHOMETRIC ANALYSIS PERFORMED MANUALLY Estrogen Receptor: 0%, NEGATIVE Progesterone Receptor: 0%, NEGATIVE   06/14/2019 Initial Diagnosis   Ductal carcinoma in situ (DCIS) of right breast      HISTORY OF PRESENTING ILLNESS:  Evelyn Morrison 79 y.o. female is a here because of newly diagnosed right breast DCIS. The patient presents to the clinic today accompanied by her daughter.   She notes she felt an unknown breast change of left breast and right breast shooting pain before her mammogram which showed she had a right breast mass. She notes her current breast pain is in left breast and not at her breast biopsy site. She thinks this is related to anxiety about her recent diagnosis. This is her first abnormal mammogram   Today the patient notes she was having a leg cramps before I came in today. She has leg cramps often and uses OTC arthritis Tylenol. pain meds for this. She was on oral calcium but this was stopped by nephrologist due to her CKD and started on Vit D. She has received her first COVID19 Vaccine.    Socially she is married and had 5 adult children, 1 of which has passed away. She is a retired Barista. She quit smoking in 1992 after smoking quarter ppd intermittently and socially for 15 years. She does not drink alcohol.    They have a PMHx of stage III CKD, DM, HTN, CAD and s/p MI in 2004 and had stent placed. She had partial hysterectomy, C-section, tubal ligations and cholecystectomy. I reviewed her medication list.  Her cousin had uterine cancer. One of her daughters is at high risk for ovarian cancer and another daughter  had cervical pre-cancerous cells. Her other daughter has recurrent breast cysts.    GYN HISTORY  Menarchal: 14 LMP: 06/1982 Contraceptive: 1971 and [phone], over 9 years  HRT: No G5P5: first at age 30     REVIEW OF SYSTEMS:    Constitutional: Denies  fevers, chills or abnormal night sweats Eyes: Denies blurriness of vision, double vision or watery eyes Ears, nose, mouth, throat, and face: Denies mucositis or sore throat Respiratory: Denies cough, dyspnea or wheezes Cardiovascular: Denies palpitation, chest discomfort or lower extremity swelling Gastrointestinal:  Denies nausea, heartburn or change in bowel habits Skin: Denies abnormal skin rashes MSK: (+) left leg cramps  Lymphatics: Denies new lymphadenopathy or easy bruising Neurological:Denies numbness, tingling or new weaknesses Behavioral/Psych: Mood is stable, no new changes  All other systems were reviewed with the patient and are negative.   MEDICAL HISTORY:  Past Medical History:  Diagnosis Date  . Anemia   . Arthritis   . CAD (coronary artery disease)    Myoview 12/17: EF 66, apical and apical lateral defect - likely attenuation artifact, no ischemia; Low Risk // Myoview 3/19:  EF 74, no ischemia or scar; low risk   . Cataract, diabetic ([place])   . Chest pain   . Constipation   . Contusion of arm   . Diabetes mellitus   . Diabetic retinopathy   . DM (diabetes mellitus) ([place])   . Esophageal stricture   . Family history of ovarian cancer   . Fatigue   . Glaucoma   . Heart burn   . Hiatal hernia   . History of echocardiogram    Echo 3/19:  Mild LVH, EF 60-65, no RWMA, Gr 1 DD, PASP 34  . HLD (hyperlipidemia)   . Hypertension   . Joint stiffness of hand   . Urinary incontinence   . Vitamin D deficiency     SURGICAL HISTORY: Past Surgical History:  Procedure Laterality Date  . adenosine cardillite  05/16/05   negative signif. myocardial ischemic  . CARDIAC CATHETERIZATION  10/04   s/p stent LV 45%  . CATARACT EXTRACTION     bilateral  . CESAREAN SECTION  84, 86   ectopic pregnancy  . CHOLECYSTECTOMY  1998  . CORONARY ANGIOPLASTY WITH STENT PLACEMENT  10/04  . PARTIAL HYSTERECTOMY      SOCIAL HISTORY: Social History   Socioeconomic History  .  Marital status: Married    Spouse name: Not on file  . Number of children: 5   . Years of education: Not on file  . Highest education level: Not on file  Occupational History  . Occupation: Education officer, museum    Comment: Retired  Tobacco Use  . Smoking status: Former Smoker    Packs/day: 0.25    Years: 15.00    Pack years: 3.75    Quit date: 03/31/1990    Years since quitting: 29.2  . Smokeless tobacco: Never Used  Substance and Sexual Activity  . Alcohol use: No    Comment: ocassional  . Drug use: No  . Sexual activity: Not on file  Other Topics Concern  . Not on file  Social History Narrative   2 caffeine drinks daily    Social Determinants of Health   Financial Resource Strain:   . Difficulty of Paying Living Expenses:   Food Insecurity:   . Worried About Charity fundraiser in the Last Year:   . Arboriculturist in the Last Year:   Transportation Needs:   .  Lack of Transportation (Medical):   Marland Kitchen Lack of Transportation (Non-Medical):   Physical Activity:   . Days of Exercise per Week:   . Minutes of Exercise per Session:   Stress:   . Feeling of Stress :   Social Connections:   . Frequency of Communication with Friends and Family:   . Frequency of Social Gatherings with Friends and Family:   . Attends Religious Services:   . Active Member of Clubs or Organizations:   . Attends Archivist Meetings:   Marland Kitchen Marital Status:   Intimate Partner Violence:   . Fear of Current or Ex-Partner:   . Emotionally Abused:   Marland Kitchen Physically Abused:   . Sexually Abused:     FAMILY HISTORY: Family History  Problem Relation Age of Onset  . Heart disease Mother   . Heart attack Father   . Diabetes Other   . Crohn's disease Brother   . Crohn's disease Brother   . Crohn's disease Brother   . Ovarian cancer Cousin 28  . Colon cancer Neg Hx     ALLERGIES:  is allergic to penicillins; ibuprofen; lactose intolerance (gi); milk-related compounds; nyquil multi-symptom  [pseudoeph-doxylamine-dm-apap]; and latex.  MEDICATIONS:  Current Outpatient Medications  Medication Sig Dispense Refill  . acetaminophen (TYLENOL) 500 MG tablet Take 500-1,000 mg by mouth every 6 (six) hours as needed (for pain).    Marland Kitchen albuterol (PROVENTIL HFA;VENTOLIN HFA) 108 (90 Base) MCG/ACT inhaler Inhale 1-2 puffs into the lungs every 4 (four) hours as needed for wheezing or shortness of breath. 1 Inhaler 0  . aspirin 81 MG EC tablet Take 81 mg by mouth every morning.     . benzonatate (TESSALON) 200 MG capsule Take 1 capsule (200 mg total) by mouth 3 (three) times daily as needed for cough. 30 capsule 0  . Cholecalciferol (D3 HIGH POTENCY) 50 MCG (2000 UT) CAPS Take 1 capsule by mouth daily.    . ferrous sulfate 325 (65 FE) MG tablet Take 325 mg by mouth daily with breakfast.    . gabapentin (NEURONTIN) 300 MG capsule Take 300 mg by mouth 2 (two) times daily.    Marland Kitchen glipiZIDE (GLUCOTROL) 5 MG tablet Take 0.5 tablets (2.5 mg total) by mouth daily. 45 tablet 3  . lisinopril-hydrochlorothiazide (PRINZIDE,ZESTORETIC) 20-12.5 MG tablet Take 1 tablet by mouth daily.    Marland Kitchen lovastatin (MEVACOR) 10 MG tablet Take 10 mg by mouth at bedtime.    . meloxicam (MOBIC) 15 MG tablet Take 15 mg by mouth daily.  1  . Multiple Vitamin (MULTIVITAMIN) tablet Take 1 tablet by mouth daily.      Marland Kitchen MYRBETRIQ 25 MG TB24 tablet     . nitroGLYCERIN (NITROSTAT) 0.4 MG SL tablet Place 0.4 mg under the tongue every 5 (five) minutes as needed for chest pain (3 doses max).     Marland Kitchen senna (SENOKOT) 8.6 MG tablet Take 2 tablets by mouth daily as needed for constipation.     No current facility-administered medications for this visit.    PHYSICAL EXAMINATION: ECOG PERFORMANCE STATUS: 1 - Symptomatic but completely ambulatory  Vitals:   06/22/19 1256  BP: 115/79  Pulse: 88  Resp: 18  Temp: 98.3 F (36.8 C)  SpO2: 100%   Filed Weights   06/22/19 1256  Weight: 154 lb 14.4 oz (70.3 kg)    GENERAL:alert, no distress  and comfortable SKIN: skin color, texture, turgor are normal, no rashes or significant lesions EYES: normal, Conjunctiva are pink and non-injected, sclera  clear  NECK: supple, thyroid normal size, non-tender, without nodularity LYMPH:  no palpable lymphadenopathy in the cervical, axillary  LUNGS: clear to auscultation and percussion with normal breathing effort HEART: regular rate & rhythm and no murmurs and no lower extremity edema ABDOMEN:abdomen soft, non-tender and normal bowel sounds Musculoskeletal:no cyanosis of digits and no clubbing  NEURO: alert & oriented x 3 with fluent speech, no focal motor/sensory deficits BREAST: (+) Mild skin lump at right breast biopsy site. No palpable mass, nodules or adenopathy bilaterally. Breast exam benign.  LABORATORY DATA:  I have reviewed the data as listed CBC Latest Ref Rng & Units 06/22/2019 06/28/2018 04/24/2017  WBC 4.0 - 10.5 K/uL 5.6 5.8 6.0  Hemoglobin 12.0 - 15.0 g/dL 12.8 12.8 10.6(L)  Hematocrit 36.0 - 46.0 % 39.9 40.7 30.1(L)  Platelets 150 - 400 K/uL 194 137(L) 249    CMP Latest Ref Rng & Units 06/22/2019 06/28/2018 04/24/2017  Glucose 70 - 99 mg/dL 138(H) 168(H) 102(H)  BUN 8 - 23 mg/dL 21 33(H) 24  Creatinine 0.44 - 1.00 mg/dL 1.48(H) 1.93(H) 1.47(H)  Sodium 135 - 145 mmol/L 142 137 142  Potassium 3.5 - 5.1 mmol/L 3.9 4.8 4.2  Chloride 98 - 111 mmol/L 105 102 105  CO2 22 - 32 mmol/L 28 21(L) 22  Calcium 8.9 - 10.3 mg/dL 10.0 8.9 9.5  Total Protein 6.5 - 8.1 g/dL 8.0 - 7.3  Total Bilirubin 0.3 - 1.2 mg/dL 0.3 - <0.2  Alkaline Phos 38 - 126 U/L 76 - 71  AST 15 - 41 U/L 21 - 20  ALT 0 - 44 U/L 18 - 17     RADIOGRAPHIC STUDIES: I have personally reviewed the radiological images as listed and agreed with the findings in the report. No results found.  ASSESSMENT & PLAN:  Evelyn Morrison is a 79 y.o. female with a history of Arthritis, CAD, DM, diabetic retinopathy, esophageal stricture, S/p partial hysterectomy, Hiatal hernia,  HLD, HTN, H/o MI in 2004 with stent placement   1. Ductal carcinoma in situ (DCIS) of right breast, Stage 0, ER-/PR- -I discussed her breast imaging and needle biopsy results with patient and her family members in great detail. She was found to have 0.6cm right breast mass in right breast found by mammogram and biopsy showed ductal carcinoma in situ.  -She is a candidate for breast conservation surgery. She will see breast surgeon Dr. Barry Dienes today -Her DCIS will be cured by complete surgical resection. Any form of adjuvant therapy is preventive. -Given her negative ER and PR, I do not recommend antiestrogen therapy given there is no benefit. -I discussed DCIS and ER/PR negative disease is more aggressive and puts her at a higher risk of developing future breast cancer. -She will likely benefit from breast radiation if she undergo lumpectomy to decrease the risk of breast cancer. She will discuss this further with Dr. Sondra Come today.   -We also discussed that biopsy may have sampling limitation, we will review her surgical path, to see if she has any invasive carcinoma components. -We discussed breast cancer surveillance after she completes treatment, Including annual mammogram, breast exam every 6-12 months. I also discussed additional screening with yearly breast MRI if she is interested.  -Her physical exam today was benign, no breast masses palpated. Labs reviewed, CBC and CMP WNL except BG 138, Cr 1.48.  -F/u as needed in the future   2. Comorbidities: DM, HTN, Stage III CKD, CAD -She had a MI in 2004 and had stent  placed.  -Her HTN and DM is controlled and her CKD is stable.  -I encouraged her to continue medications and f/u with her physicians.    3. Arthritis, Leg cramps -She has been having leg cramps more often. She has been using OTC arthritis Tylenol.  -She was previously on oral calcium but due to her CKD was switching to Vit D.   4. Genetic Testing  -She has a cousin with uterine  cancer, 1 of her daughters had cervical pre-cancerous cell, s/p removal. 2 of her other daughters are at high risk for breast and ovarian cancer.  -She is eligible for genetic testing, will proceed.    PLAN:  -She will likely have lumpectomy soon -Send Genetic refer -F/u with her as needed  -I do not recommend adjuvant antiestrogen therapy    No orders of the defined types were placed in this encounter.   All questions were answered. The patient knows to call the clinic with any problems, questions or concerns. The total time spent in the appointment was 45 minutes.     Truitt Merle, MD 06/22/2019 10:58 PM  I, Joslyn Devon, am acting as scribe for Truitt Merle, MD.   I have reviewed the above documentation for accuracy and completeness, and I agree with the above.

## 2019-06-22 NOTE — Progress Notes (Signed)
REFERRING PROVIDER: Truitt Merle, MD [address] [place],  [place] [address]  PRIMARY PROVIDER:  Benito Mccreedy, MD  PRIMARY REASON FOR VISIT:  1. Ductal carcinoma in situ (DCIS) of right breast   2. Family history of ovarian cancer    I connected with [name] on 06/22/2019 at 3:00 PM EDT by Webex and verified that I am speaking with the correct person using two identifiers.    Patient location: Evelyn Morrison Provider location: Evelyn Morrison  HISTORY OF PRESENT ILLNESS:   Evelyn Morrison, a 79 y.o. female, was seen for a [facility] cancer genetics consultation at the request of Dr. Burr Medico due to a personal and family history of cancer.  Evelyn Morrison presents to clinic today to discuss the possibility of a hereditary predisposition to cancer, genetic testing, and to further clarify her future cancer risks, as well as potential cancer risks for family members.   In 2021, at the age of 60, [name] was diagnosed with DCIS of the right breast, ER/PR-. The treatment plan includes lumpectomy, radiation.   CANCER HISTORY:  Oncology History Overview Note  Cancer Staging Ductal carcinoma in situ (DCIS) of right breast Staging form: Breast, AJCC 8th Edition - Clinical stage from 06/08/2019: Stage 0 (cTis (DCIS), cN0, cM0, ER-, PR-, HER2: Not Assessed) - Signed by Truitt Merle, MD on 06/21/2019    Ductal carcinoma in situ (DCIS) of right breast  05/30/2019 Mammogram   Diagnostic Mammogram  IMPRESSION The developing round calcifications in the upper outer aspect posterior depth right breast  measuring 0.6cm are suspicious for malignancy. A biopsy is recommended.    06/08/2019 Cancer Staging   Staging form: Breast, AJCC 8th Edition - Clinical stage from 06/08/2019: Stage 0 (cTis (DCIS), cN0, cM0, ER-, PR-, HER2: Not Assessed) - Signed by Truitt Merle, MD on 06/21/2019   06/08/2019 Initial Biopsy   Diagnosis Breast, right, needle core biopsy, calcifications - DUCTAL CARCINOMA IN  SITU. Microscopic Comment The DCIS is of intermediate to high nuclear grade with central necrosis and calcifications. Immunohistochemistry for p63, Calponin and SMM-1 demonstrates the presence of myoepithelium; a distinct invasive component is not identified in the submitted material. Ancillary studies will be reported separately. Results reported to [facility] on 06/09/2019.   06/08/2019 Receptors her2   PROGNOSTIC INDICATORS Results: IMMUNOHISTOCHEMICAL AND MORPHOMETRIC ANALYSIS PERFORMED MANUALLY Estrogen Receptor: 0%, NEGATIVE Progesterone Receptor: 0%, NEGATIVE   06/14/2019 Initial Diagnosis   Ductal carcinoma in situ (DCIS) of right breast      RISK FACTORS:  Menarche was at age 89.  First live birth at age 61.  OCP use for approximately 4 years.  Ovaries intact: 1 ovary.  Hysterectomy: yes.  Menopausal status: postmenopausal.  HRT use: 0 years. Colonoscopy: yes; normal. Mammogram within the last year: yes.   Past Medical History:  Diagnosis Date  . Anemia   . Arthritis   . CAD (coronary artery disease)    Myoview 12/17: EF 66, apical and apical lateral defect - likely attenuation artifact, no ischemia; Low Risk // Myoview 3/19:  EF 74, no ischemia or scar; low risk   . Cataract, diabetic ([place])   . Chest pain   . Constipation   . Contusion of arm   . Diabetes mellitus   . Diabetic retinopathy   . DM (diabetes mellitus) ([place])   . Esophageal stricture   . Family history of ovarian cancer   . Fatigue   . Glaucoma   . Heart burn   . Hiatal  hernia   . History of echocardiogram    Echo 3/19:  Mild LVH, EF 60-65, no RWMA, Gr 1 DD, PASP 34  . HLD (hyperlipidemia)   . Hypertension   . Joint stiffness of hand   . Urinary incontinence   . Vitamin D deficiency     Past Surgical History:  Procedure Laterality Date  . adenosine cardillite  05/16/05   negative signif. myocardial ischemic  . CARDIAC CATHETERIZATION  10/04   s/p stent LV 45%  .  CATARACT EXTRACTION     bilateral  . CESAREAN SECTION  84, 86   ectopic pregnancy  . CHOLECYSTECTOMY  1998  . CORONARY ANGIOPLASTY WITH STENT PLACEMENT  10/04  . PARTIAL HYSTERECTOMY      Social History   Socioeconomic History  . Marital status: Married    Spouse name: Not on file  . Number of children: 5   . Years of education: Not on file  . Highest education level: Not on file  Occupational History  . Occupation: Education officer, museum    Comment: Retired  Tobacco Use  . Smoking status: Former Smoker    Packs/day: 0.25    Years: 15.00    Pack years: 3.75    Quit date: 03/31/1990    Years since quitting: 29.2  . Smokeless tobacco: Never Used  Substance and Sexual Activity  . Alcohol use: No    Comment: ocassional  . Drug use: No  . Sexual activity: Not on file  Other Topics Concern  . Not on file  Social History Narrative   2 caffeine drinks daily    Social Determinants of Health   Financial Resource Strain:   . Difficulty of Paying Living Expenses:   Food Insecurity:   . Worried About Charity fundraiser in the Last Year:   . Arboriculturist in the Last Year:   Transportation Needs:   . Film/video editor (Medical):   Marland Kitchen Lack of Transportation (Non-Medical):   Physical Activity:   . Days of Exercise per Week:   . Minutes of Exercise per Session:   Stress:   . Feeling of Stress :   Social Connections:   . Frequency of Communication with Friends and Family:   . Frequency of Social Gatherings with Friends and Family:   . Attends Religious Services:   . Active Member of Clubs or Organizations:   . Attends Archivist Meetings:   Marland Kitchen Marital Status:      FAMILY HISTORY:  We obtained a detailed, 4-generation family history.  Significant diagnoses are listed below: Family History  Problem Relation Age of Onset  . Heart disease Mother   . Heart attack Father   . Diabetes Other   . Crohn's disease Brother   . Crohn's disease Brother   . Crohn's disease  Brother   . Ovarian cancer Cousin 64  . Colon cancer Neg Hx    Evelyn Morrison had 2 sons and 3 daughters. One son passed away at 47. Patient has 5 brothers and 4 sisters, all are younger than her in their 66s-60s, no cancers.  Evelyn Morrison's father died at 61 due to heart issues. He was adopted and she has no information about his biological family.  Ms. Gladden's mother died due to heart issues at 6. Patient had 4 maternal aunts and 1 maternal uncle, no cancers. One of her maternal cousins had ovarian cancer at 65 and died at 78. Patient's maternal grandmother died in her 70s,  grandfather died older than 90.   Ms. Mchaney is unaware of previous family history of genetic testing for hereditary cancer risks. Patient's maternal ancestors are of Caucasian, [place] and [place] American descent, and paternal ancestors are of unknown descent. There is no reported Ashkenazi Jewish ancestry. There is no known consanguinity.  GENETIC COUNSELING ASSESSMENT: Ms. Shaw is a 79 y.o. female with a personal and family history of cancer which is somewhat suggestive of a hereditary cancer syndrome and predisposition to cancer. We, therefore, discussed and recommended the following at today's visit.   DISCUSSION: We discussed that 5 - 10% of breast cancer is hereditary, with most cases associated with BRCA1/BRCA2 mutations.  There are other genes that can be associated with hereditary breast cancer syndromes.  We discussed that testing is beneficial for several reasons including surgical decision-making for breast cancer, knowing how to follow individuals after completing their treatment, and understand if other family members could be at risk for cancer and allow them to undergo genetic testing.  Ms. Paden reports she feels good about her current surgical decision and will not use genetic testing to change a surgical decision.   We reviewed the characteristics, features and inheritance patterns of  hereditary cancer syndromes. We also discussed genetic testing, including the appropriate family members to test, the process of testing, insurance coverage and turn-around-time for results. We discussed the implications of a negative, positive and/or variant of uncertain significant result. We recommended Ms. Talcott pursue genetic testing for the Common Hereditary Cancers gene panel.   The Common Hereditary Cancers Panel offered by [facility] includes sequencing and/or deletion duplication testing of the following 48 genes: APC, ATM, AXIN2, BARD1, BMPR1A, BRCA1, BRCA2, BRIP1, CDH1, CDKN2A (p14ARF), CDKN2A (p16INK4a), CKD4, CHEK2, CTNNA1, DICER1, EPCAM (Deletion/duplication testing only), GREM1 (promoter region deletion/duplication testing only), KIT, MEN1, MLH1, MSH2, MSH3, MSH6, MUTYH, NBN, NF1, NHTL1, PALB2, PDGFRA, PMS2, POLD1, POLE, PTEN, RAD50, RAD51C, RAD51D, RNF43, SDHB, SDHC, SDHD, SMAD4, SMARCA4. STK11, TP53, TSC1, TSC2, and VHL.  The following genes were evaluated for sequence changes only: SDHA and HOXB13 c.251G>A variant only.  Based on Ms. Gill's personal and family history of cancer, she meets medical criteria for genetic testing. Despite that she meets criteria, she may still have an out of pocket cost.   PLAN: After considering the risks, benefits, and limitations, Ms. Colvin provided informed consent to pursue genetic testing and the blood sample was sent to [facility] for analysis of the Common Hereditary Cancers Panel. Results should be available within approximately 2-3 weeks' time, at which point they will be disclosed by telephone to Ms. Frohlich, as will any additional recommendations warranted by these results. Ms. Lizana will receive a summary of her genetic counseling visit and a copy of her results once available. This information will also be available in Epic.   Ms. Pastrana's questions were answered to her satisfaction today. Our contact information was  provided should additional questions or concerns arise. Thank you for the referral and allowing [place] to share in the care of your patient.   Faith Rogue, MS, [facility] Genetic Counselor [email or web address] Phone: [phone]  The patient was seen for a total of 15 minutes in face-to-face genetic counseling. Patient's daughter York Cerise was also present.  Drs. Magrinat, Lindi Adie and/or Burr Medico were available for discussion regarding this case.   _______________________________________________________________________ For Office Staff:  Number of people involved in session: 2 Was an Intern/ student involved with case: no

## 2019-06-22 NOTE — Progress Notes (Signed)
Clinical Social Work [facility] Psychosocial Distress Screening [place]   Patient completed distress screening protocol and scored a 10 on the Psychosocial Distress Thermometer which indicates severe distress. Clinical Social Worker met with patient and patient's daughter in [facility] to assess for distress and other psychosocial needs.  Patient stated she was feeling overwhelmed but felt better after meeting with the treatment team and getting more information on her treatment plan.    Patient has strong support from her four children who are all in their 61's & 60's and live between [place]. Current stressor is housing as she is deciding whether to continue living with her daughter, live with another child, or get a small apartment of her own.   CSW and patient discussed common feeling and emotions when being diagnosed with cancer, and the importance of support during treatment.  CSW informed patient of the support team and support services at [facility].  CSW provided contact information and encouraged patient to call with any questions or concerns.    Distress Screen: ONCBCN DISTRESS SCREENING 06/22/2019  Screening Type Initial Screening  Distress experienced in past week (1-10) 10  Practical problem type Housing  Emotional problem type Adjusting to illness;Feeling hopeless  Spiritual/Religous concerns type Facing my mortality  Information Concerns Type Lack of info about maintaining fitness  Physical Problem type Pain;Mouth sores/swallowing;Tingling hands/feet;Skin dry/itchy     [place]

## 2019-06-30 ENCOUNTER — Encounter: Payer: Self-pay | Admitting: *Deleted

## 2019-06-30 NOTE — Progress Notes (Signed)
Left vm regarding bmdc from 3.24.21. Contact information provided for questions or needs.

## 2019-07-04 ENCOUNTER — Telehealth: Payer: Self-pay | Admitting: Licensed Clinical Social Worker

## 2019-07-04 NOTE — Telephone Encounter (Signed)
Revealed negative genetic testing.  Revealed that a VUS in APC and NTHL1 was identified. This normal result is reassuring and indicates that it is unlikely Evelyn Morrison's cancer is due to a hereditary cause.  It is unlikely that there is an increased risk of another cancer due to a mutation in one of these genes.  However, genetic testing is not perfect, and cannot definitively rule out a hereditary cause.  It will be important for her to keep in contact with genetics to learn if any additional testing may be needed in the future.

## 2019-07-05 ENCOUNTER — Ambulatory Visit: Payer: Self-pay | Admitting: Licensed Clinical Social Worker

## 2019-07-05 ENCOUNTER — Encounter: Payer: Self-pay | Admitting: Licensed Clinical Social Worker

## 2019-07-05 DIAGNOSIS — D0511 Intraductal carcinoma in situ of right breast: Secondary | ICD-10-CM

## 2019-07-05 DIAGNOSIS — Z1379 Encounter for other screening for genetic and chromosomal anomalies: Secondary | ICD-10-CM

## 2019-07-05 DIAGNOSIS — Z8041 Family history of malignant neoplasm of ovary: Secondary | ICD-10-CM

## 2019-07-05 NOTE — Progress Notes (Signed)
HPI:  Evelyn Morrison was previously seen in the [facility] clinic due to a personal and family history of cancer and concerns regarding a hereditary predisposition to cancer. Please refer to our prior cancer genetics clinic note for more information regarding our discussion, assessment and recommendations, at the time. Evelyn Morrison's recent genetic test results were disclosed to her, as were recommendations warranted by these results. These results and recommendations are discussed in more detail below.  CANCER HISTORY:  Oncology History Overview Note  Cancer Staging Ductal carcinoma in situ (DCIS) of right breast Staging form: Breast, AJCC 8th Edition - Clinical stage from 06/08/2019: Stage 0 (cTis (DCIS), cN0, cM0, ER-, PR-, HER2: Not Assessed) - Signed by Truitt Merle, MD on 06/21/2019    Ductal carcinoma in situ (DCIS) of right breast  05/30/2019 Mammogram   Diagnostic Mammogram  IMPRESSION The developing round calcifications in the upper outer aspect posterior depth right breast  measuring 0.6cm are suspicious for malignancy. A biopsy is recommended.    06/08/2019 Cancer Staging   Staging form: Breast, AJCC 8th Edition - Clinical stage from 06/08/2019: Stage 0 (cTis (DCIS), cN0, cM0, ER-, PR-, HER2: Not Assessed) - Signed by Truitt Merle, MD on 06/21/2019   06/08/2019 Initial Biopsy   Diagnosis Breast, right, needle core biopsy, calcifications - DUCTAL CARCINOMA IN SITU. Microscopic Comment The DCIS is of intermediate to high nuclear grade with central necrosis and calcifications. Immunohistochemistry for p63, Calponin and SMM-1 demonstrates the presence of myoepithelium; a distinct invasive component is not identified in the submitted material. Ancillary studies will be reported separately. Results reported to [facility] on 06/09/2019.   06/08/2019 Receptors her2   PROGNOSTIC INDICATORS Results: IMMUNOHISTOCHEMICAL AND MORPHOMETRIC ANALYSIS PERFORMED MANUALLY  Estrogen Receptor: 0%, NEGATIVE Progesterone Receptor: 0%, NEGATIVE   06/14/2019 Initial Diagnosis   Ductal carcinoma in situ (DCIS) of right breast    Genetic Testing   Negative genetic testing. No pathogenic variants identified on the [facility] Common Hereditary Caners Panel. VUS in APC called c.-30476T>A (Non-coding) and VUS in [place] called c.580G>A (p.Ala194Thr) identified. The report date is 07/04/2019.  The Common Hereditary Cancers Panel offered by [facility] includes sequencing and/or deletion duplication testing of the following 48 genes: APC, ATM, AXIN2, BARD1, BMPR1A, BRCA1, BRCA2, BRIP1, CDH1, CDKN2A (p14ARF), CDKN2A (p16INK4a), CKD4, CHEK2, CTNNA1, DICER1, EPCAM (Deletion/duplication testing only), GREM1 (promoter region deletion/duplication testing only), KIT, MEN1, MLH1, MSH2, MSH3, MSH6, MUTYH, NBN, NF1, NHTL1, PALB2, PDGFRA, PMS2, POLD1, POLE, PTEN, RAD50, RAD51C, RAD51D, RNF43, SDHB, SDHC, SDHD, SMAD4, SMARCA4. STK11, TP53, TSC1, TSC2, and VHL.  The following genes were evaluated for sequence changes only: SDHA and HOXB13 c.251G>A variant only.     FAMILY HISTORY:  We obtained a detailed, 4-generation family history.  Significant diagnoses are listed below: Family History  Problem Relation Age of Onset  . Heart disease Mother   . Heart attack Father   . Diabetes Other   . Crohn's disease Brother   . Crohn's disease Brother   . Crohn's disease Brother   . Ovarian cancer Cousin 10  . Colon cancer Neg Hx    Evelyn Morrison had 2 sons and 3 daughters. One son passed away at 98. Patient has 5 brothers and 4 sisters, all are younger than her in their 66s-60s, no cancers.  Evelyn Morrison's father died at 58 due to heart issues. He was adopted and she has no information about his biological family.  [name]'s mother died due to heart issues at 26. Patient had  4 maternal aunts and 1 maternal uncle, no cancers. One of her maternal cousins had ovarian cancer at 21 and died at 43.  Patient's maternal grandmother died in her 43s, grandfather died older than 89.   Evelyn Morrison is unaware of previous family history of genetic testing for hereditary cancer risks. Patient's maternal ancestors are of Caucasian, [place] and [place] American descent, and paternal ancestors are of unknown descent. There is no reported Ashkenazi Jewish ancestry. There is no known consanguinity.   GENETIC TEST RESULTS: Genetic testing reported out on 07/04/2019 through the [facility] Common Hereditary cancer panel found no pathogenic mutations.  The Common Hereditary Cancers Panel offered by [facility] includes sequencing and/or deletion duplication testing of the following 48 genes: APC, ATM, AXIN2, BARD1, BMPR1A, BRCA1, BRCA2, BRIP1, CDH1, CDKN2A (p14ARF), CDKN2A (p16INK4a), CKD4, CHEK2, CTNNA1, DICER1, EPCAM (Deletion/duplication testing only), GREM1 (promoter region deletion/duplication testing only), KIT, MEN1, MLH1, MSH2, MSH3, MSH6, MUTYH, NBN, NF1, NHTL1, PALB2, PDGFRA, PMS2, POLD1, POLE, PTEN, RAD50, RAD51C, RAD51D, RNF43, SDHB, SDHC, SDHD, SMAD4, SMARCA4. STK11, TP53, TSC1, TSC2, and VHL.  The following genes were evaluated for sequence changes only: SDHA and HOXB13 c.251G>A variant only.  The test report has been scanned into EPIC and is located under the Molecular Pathology section of the Results Review tab.  A portion of the result report is included below for reference.      We discussed with Evelyn Morrison that because current genetic testing is not perfect, it is possible there may be a gene mutation in one of these genes that current testing cannot detect, but that chance is small.  We also discussed, that there could be another gene that has not yet been discovered, or that we have not yet tested, that is responsible for the cancer diagnoses in the family. It is also possible there is a hereditary cause for the cancer in the family that Evelyn Morrison did not inherit and therefore was not  identified in her testing.  Therefore, it is important to remain in touch with cancer genetics in the future so that we can continue to offer Evelyn Morrison the most up to date genetic testing.   Genetic testing did identify 2 Variants of uncertain significance (VUS) - one in the APC gene and a second in the [place] gene.  At this time, it is unknown if these variants are associated with increased cancer risk or if they are normal findings, but most variants such as these get reclassified to being inconsequential. They should not be used to make medical management decisions. With time, we suspect the lab will determine the significance of these variants, if any. If we do learn more about them, we will try to contact Ms. Endres to discuss it further. However, it is important to stay in touch with [place] periodically and keep the address and phone number up to date.  ADDITIONAL GENETIC TESTING: We discussed with Ms. Topor that her genetic testing was fairly extensive.  If there are genes identified to increase cancer risk that can be analyzed in the future, we would be happy to discuss and coordinate this testing at that time.    CANCER SCREENING RECOMMENDATIONS: Ms. Myung's test result is considered negative (normal).  This means that we have not identified a hereditary cause for her  personal and family history of cancer at this time. Most cancers happen by chance and this negative test suggests that her cancer may fall into this category.    While reassuring, this does  not definitively rule out a hereditary predisposition to cancer. It is still possible that there could be genetic mutations that are undetectable by current technology. There could be genetic mutations in genes that have not been tested or identified to increase cancer risk.  Therefore, it is recommended she continue to follow the cancer management and screening guidelines provided by her oncology and primary healthcare provider.   An  individual's cancer risk and medical management are not determined by genetic test results alone. Overall cancer risk assessment incorporates additional factors, including personal medical history, family history, and any available genetic information that may result in a personalized plan for cancer prevention and surveillance.  RECOMMENDATIONS FOR FAMILY MEMBERS:  Relatives in this family might be at some increased risk of developing cancer, over the general population risk, simply due to the family history of cancer.  We recommended female relatives in this family have a yearly mammogram beginning at age 78, or 25 years younger than the earliest onset of cancer, an annual clinical breast exam, and perform monthly breast self-exams. Female relatives in this family should also have a gynecological exam as recommended by their primary provider. All family members should have a colonoscopy by age 26, or as directed by their physicians.  It is also possible there is a hereditary cause for the cancer in Ms. Waszak's family that she did not inherit and therefore was not identified in her.  Based on Ms. Fennell's family history, we recommended those related to her matenral cousin who had ovarian cancer have genetic counseling and testing. Ms. Dossett will let us know if we can be of any assistance in coordinating genetic counseling and/or testing for these family members.  FOLLOW-UP: Lastly, we discussed with Ms. Cullens that cancer genetics is a rapidly advancing field and it is possible that new genetic tests will be appropriate for her and/or her family members in the future. We encouraged her to remain in contact with cancer genetics on an annual basis so we can update her personal and family histories and let her know of advances in cancer genetics that may benefit this family.   Our contact number was provided. Ms. Halberg's questions were answered to her satisfaction, and she knows she is welcome to  call us at anytime with additional questions or concerns.   Faith Rogue, MS, [facility] Genetic Counselor [email or web address] Phone: [phone]

## 2019-07-06 ENCOUNTER — Encounter: Payer: Self-pay | Admitting: *Deleted

## 2019-07-06 DIAGNOSIS — D0511 Intraductal carcinoma in situ of right breast: Secondary | ICD-10-CM

## 2019-07-11 ENCOUNTER — Encounter: Payer: Self-pay | Admitting: *Deleted

## 2019-07-25 ENCOUNTER — Encounter (HOSPITAL_BASED_OUTPATIENT_CLINIC_OR_DEPARTMENT_OTHER): Payer: Self-pay | Admitting: General Surgery

## 2019-07-25 ENCOUNTER — Other Ambulatory Visit: Payer: Self-pay

## 2019-07-26 ENCOUNTER — Other Ambulatory Visit (HOSPITAL_COMMUNITY)
Admission: RE | Admit: 2019-07-26 | Discharge: 2019-07-26 | Disposition: A | Discharge status: Home / Self Care | Payer: Medicare HMO | Source: Ambulatory Visit | Attending: General Surgery | Admitting: General Surgery

## 2019-07-26 DIAGNOSIS — Z01812 Encounter for preprocedural laboratory examination: Secondary | ICD-10-CM | POA: Insufficient documentation

## 2019-07-26 DIAGNOSIS — Z20822 Contact with and (suspected) exposure to covid-19: Secondary | ICD-10-CM | POA: Insufficient documentation

## 2019-07-26 LAB — SARS CORONAVIRUS 2 (TAT 6-24 HRS): SARS Coronavirus 2: NEGATIVE

## 2019-07-26 NOTE — H&P (Signed)
Evelyn Morrison  Location: [facility] Patient #: [id number] DOB: 11/17/1940 Undefined / Language: Evelyn Morrison / Race: Black or African American Female   History of Present Illness The patient is a 79 year old female who presents with breast cancer. Pt is a lovely 79 yo F who was diagnosed with screening detected right breast cancer 05/2019. She had screening detected calcifications in the UOQ of the right breast. She had diagnostic imaging which showed a 6 mm area of calcifications. 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   She has had a maternal cousin with uterine cancer and a maternal aunt with ovarian cancer.   dx mammogram 05/30/2019 SOLIS density B developing round calcifications in the right UOQ around 5 mm in size.    pathology 06/08/2019 Breast, right, needle core biopsy, calcifications - DUCTAL CARCINOMA IN SITU. The DCIS is of intermediate to high nuclear grade with central necrosis and calcifications. Estrogen Receptor: 0%, NEGATIVE Progesterone Receptor: 0%, NEGATIVE  CBC normal CMET Cr 1.48 (normal between 1.4 and 2)   Past Surgical History Cesarean Section - 1  Gallbladder Surgery - Laparoscopic  Hysterectomy (not due to cancer) - Partial  Valve Replacement   Diagnostic Studies History  Colonoscopy  >10 years ago Mammogram  within last year Pap Smear  >5 years ago  Medication History  Medications Reconciled  Social History  Caffeine use  Coffee. No alcohol use  No drug use  Tobacco use  Former smoker.  Family History Alcohol Abuse  Father. Heart Disease  Father, Mother. Ovarian Cancer  Family Members In General.  Pregnancy / Birth History Age at menarche  36 years. Age of menopause  [age] Contraceptive History  Intrauterine device, Oral contraceptives. Gravida  5 Irregular periods  Length (months) of breastfeeding  3-6 Maternal age  6-30 Para  5  Other Problems   Anxiety Disorder  Arthritis  Bladder Problems  Diabetes Mellitus  High blood pressure  Myocardial infarction     Review of Systems General Present- Appetite Loss. Not Present- Chills, Fatigue, Fever, Night Sweats, Weight Gain and Weight Loss. Skin Not Present- Change in Wart/Mole, Dryness, Hives, Jaundice, New Lesions, Non-Healing Wounds, Rash and Ulcer. HEENT Present- Sore Throat and Wears glasses/contact lenses. Not Present- Earache, Hearing Loss, Hoarseness, Nose Bleed, Oral Ulcers, Ringing in the Ears, Seasonal Allergies, Sinus Pain, Visual Disturbances and Yellow Eyes. Respiratory Not Present- Bloody sputum, Chronic Cough, Difficulty Breathing, Snoring and Wheezing. Breast Present- Breast Pain. Not Present- Breast Mass, Nipple Discharge and Skin Changes. Cardiovascular Present- Chest Pain and Shortness of Breath. Not Present- Difficulty Breathing Lying Down, Leg Cramps, Palpitations, Rapid Heart Rate and Swelling of Extremities. Gastrointestinal Present- Constipation. Not Present- Abdominal Pain, Bloating, Bloody Stool, Change in Bowel Habits, Chronic diarrhea, Difficulty Swallowing, Excessive gas, Gets full quickly at meals, Hemorrhoids, Indigestion, Nausea, Rectal Pain and Vomiting. Female Genitourinary Present- Urgency. Not Present- Frequency, Nocturia, Painful Urination and Pelvic Pain. Musculoskeletal Present- Joint Pain. Not Present- Back Pain, Joint Stiffness, Muscle Pain, Muscle Weakness and Swelling of Extremities. Neurological Present- Numbness. Not Present- Decreased Memory, Fainting, Headaches, Seizures, Tingling, Tremor, Trouble walking and Weakness. Psychiatric Present- Anxiety. Not Present- Bipolar, Change in Sleep Pattern, Depression, Fearful and Frequent crying. Endocrine Present- New Diabetes. Not Present- Cold Intolerance, Excessive Hunger, Hair Changes, Heat Intolerance and Hot flashes. Hematology Present- Blood Thinners. Not Present- Easy Bruising, Excessive  bleeding, Gland problems, HIV and Persistent Infections.  Vitals  Weight: 154.9 lb Height: 61in Body Surface Area: 1.69 m  Body Mass Index: 29.27 kg/m  Temp.: 98.67F  Pulse: 88 (Regular)  Resp.: 18 (Unlabored)  BP: 115/79(Sitting, Left Arm, Standard)       Physical Exam General Mental Status-Alert. General Appearance-Consistent with stated age. Hydration-Well hydrated. Voice-Normal.  Head and Neck Head-normocephalic, atraumatic with no lesions or palpable masses. Trachea-midline. Thyroid Gland Characteristics - normal size and consistency.  Eye Eyeball - Bilateral-Extraocular movements intact. Sclera/Conjunctiva - Bilateral-No scleral icterus.  Chest and Lung Exam Chest and lung exam reveals -quiet, even and easy respiratory effort with no use of accessory muscles and on auscultation, normal breath sounds, no adventitious sounds and normal vocal resonance. Inspection Chest Wall - Normal. Back - normal.  Breast Note: ptotic bilaterally. no palpable masses. faint bruising UOQ right breast. no nipple retraction or nipple discharge. no LAD.   Cardiovascular Cardiovascular examination reveals -normal heart sounds, regular rate and rhythm with no murmurs and normal pedal pulses bilaterally.  Abdomen Inspection Inspection of the abdomen reveals - No Hernias. Palpation/Percussion Palpation and Percussion of the abdomen reveal - Soft, Non Tender, No Rebound tenderness, No Rigidity (guarding) and No hepatosplenomegaly. Auscultation Auscultation of the abdomen reveals - Bowel sounds normal.  Neurologic Neurologic evaluation reveals -alert and oriented x 3 with no impairment of recent or remote memory. Mental Status-Normal.  Musculoskeletal Global Assessment -Note: no gross deformities.  Normal Exam - Left-Upper Extremity Strength Normal and Lower Extremity Strength Normal. Normal Exam - Right-Upper Extremity Strength Normal  and Lower Extremity Strength Normal.  Lymphatic Head & Neck  General Head & Neck Lymphatics: Bilateral - Description - Normal. Axillary  General Axillary Region: Bilateral - Description - Normal. Tenderness - Non Tender. Femoral & Inguinal  Generalized Femoral & Inguinal Lymphatics: Bilateral - Description - No Generalized lymphadenopathy.    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  She is a good candidate for lumpectomy. Will plan seed localized lumpectomy followed by radiation given the hormone negative and higher grade.  The surgical procedure was described to the patient. I discussed the incision type and location and that we would need radiology involved on with a wire or seed marker and/or sentinel node.  The risks and benefits of the procedure were described to the patient and she wishes to proceed.  We discussed the risks bleeding, infection, damage to other structures, need for further procedures/surgeries. We discussed the risk of seroma. The patient was advised if the area in the breast in cancer, we may need to go back to surgery for additional tissue to obtain negative margins or for a lymph node biopsy. The patient was advised that these are the most common complications, but that others can occur as well. They were advised against taking aspirin or other anti-inflammatory agents/blood thinners the week before surgery. Current Plans You are being scheduled for surgery- Our schedulers will call you.  You should hear from our office's scheduling department within 5 working days about the location, date, and time of surgery. We try to make accommodations for patient's preferences in scheduling surgery, but sometimes the OR schedule or the surgeon's schedule prevents [place] from making those accommodations.  If you  have not heard from our office [phone]) in 5 working days, call the office and ask for your surgeon's nurse.  If you have other questions about your diagnosis, plan, or surgery, call the office and ask for your surgeon's  nurse.  CORONARY ARTERY DISEASE (I25.10) Impression: I have contacted Dr. Burt Knack and no additional cardiac testing was needed.  FAMILY HISTORY OF CANCER (Z80.9) Negative, but VUS in APC and NTHL1

## 2019-07-27 ENCOUNTER — Encounter (HOSPITAL_BASED_OUTPATIENT_CLINIC_OR_DEPARTMENT_OTHER)
Admission: RE | Admit: 2019-07-27 | Discharge: 2019-07-27 | Disposition: A | Discharge status: Home / Self Care | Payer: Medicare HMO | Source: Ambulatory Visit | Attending: General Surgery | Admitting: General Surgery

## 2019-07-27 DIAGNOSIS — Z01818 Encounter for other preprocedural examination: Secondary | ICD-10-CM | POA: Insufficient documentation

## 2019-07-27 LAB — BASIC METABOLIC PANEL
Anion gap: 10 (ref 5–15)
BUN: 29 mg/dL — ABNORMAL HIGH (ref 8–23)
CO2: 25 mmol/L (ref 22–32)
Calcium: 9.5 mg/dL (ref 8.9–10.3)
Chloride: 103 mmol/L (ref 98–111)
Creatinine, Ser: 1.49 mg/dL — ABNORMAL HIGH (ref 0.44–1.00)
GFR calc Af Amer: 39 mL/min — ABNORMAL LOW (ref >60)
GFR calc non Af Amer: 34 mL/min — ABNORMAL LOW (ref >60)
Glucose, Bld: 104 mg/dL — ABNORMAL HIGH (ref 70–99)
Potassium: 3.8 mmol/L (ref 3.5–5.1)
Sodium: 138 mmol/L (ref 135–145)

## 2019-07-27 MED ORDER — ENSURE PRE-SURGERY PO LIQD: 296.0000 mL | Freq: Once | ORAL | Status: DC

## 2019-07-27 NOTE — Progress Notes (Signed)

## 2019-07-28 DIAGNOSIS — D0511 Intraductal carcinoma in situ of right breast: Secondary | ICD-10-CM | POA: Diagnosis not present

## 2019-07-28 DIAGNOSIS — C50411 Malignant neoplasm of upper-outer quadrant of right female breast: Secondary | ICD-10-CM | POA: Diagnosis not present

## 2019-07-29 ENCOUNTER — Ambulatory Visit (HOSPITAL_BASED_OUTPATIENT_CLINIC_OR_DEPARTMENT_OTHER): Payer: Medicare HMO | Admitting: Certified Registered Nurse Anesthetist

## 2019-07-29 ENCOUNTER — Other Ambulatory Visit: Payer: Self-pay

## 2019-07-29 ENCOUNTER — Encounter (HOSPITAL_BASED_OUTPATIENT_CLINIC_OR_DEPARTMENT_OTHER): Payer: Self-pay | Admitting: General Surgery

## 2019-07-29 ENCOUNTER — Ambulatory Visit (HOSPITAL_BASED_OUTPATIENT_CLINIC_OR_DEPARTMENT_OTHER)
Admission: RE | Admit: 2019-07-29 | Discharge: 2019-07-29 | Disposition: A | Discharge status: Home / Self Care | Payer: Medicare HMO | Attending: General Surgery | Admitting: General Surgery

## 2019-07-29 ENCOUNTER — Encounter (HOSPITAL_BASED_OUTPATIENT_CLINIC_OR_DEPARTMENT_OTHER)
Admission: RE | Disposition: A | Discharge status: Home / Self Care | Payer: Self-pay | Source: Home / Self Care | Attending: General Surgery

## 2019-07-29 DIAGNOSIS — I252 Old myocardial infarction: Secondary | ICD-10-CM | POA: Insufficient documentation

## 2019-07-29 DIAGNOSIS — Z8049 Family history of malignant neoplasm of other genital organs: Secondary | ICD-10-CM | POA: Insufficient documentation

## 2019-07-29 DIAGNOSIS — D0511 Intraductal carcinoma in situ of right breast: Secondary | ICD-10-CM | POA: Diagnosis not present

## 2019-07-29 DIAGNOSIS — M1990 Unspecified osteoarthritis, unspecified site: Secondary | ICD-10-CM | POA: Diagnosis not present

## 2019-07-29 DIAGNOSIS — Z952 Presence of prosthetic heart valve: Secondary | ICD-10-CM | POA: Insufficient documentation

## 2019-07-29 DIAGNOSIS — Z87891 Personal history of nicotine dependence: Secondary | ICD-10-CM | POA: Diagnosis not present

## 2019-07-29 DIAGNOSIS — I10 Essential (primary) hypertension: Secondary | ICD-10-CM | POA: Diagnosis not present

## 2019-07-29 DIAGNOSIS — Z171 Estrogen receptor negative status [ER-]: Secondary | ICD-10-CM | POA: Insufficient documentation

## 2019-07-29 DIAGNOSIS — E119 Type 2 diabetes mellitus without complications: Secondary | ICD-10-CM | POA: Diagnosis not present

## 2019-07-29 DIAGNOSIS — I2510 Atherosclerotic heart disease of native coronary artery without angina pectoris: Secondary | ICD-10-CM | POA: Diagnosis not present

## 2019-07-29 DIAGNOSIS — C50911 Malignant neoplasm of unspecified site of right female breast: Secondary | ICD-10-CM | POA: Diagnosis not present

## 2019-07-29 DIAGNOSIS — Z90711 Acquired absence of uterus with remaining cervical stump: Secondary | ICD-10-CM | POA: Diagnosis not present

## 2019-07-29 DIAGNOSIS — Z8041 Family history of malignant neoplasm of ovary: Secondary | ICD-10-CM | POA: Diagnosis not present

## 2019-07-29 DIAGNOSIS — C50311 Malignant neoplasm of lower-inner quadrant of right female breast: Secondary | ICD-10-CM | POA: Diagnosis not present

## 2019-07-29 HISTORY — DX: Dyspnea, unspecified: R06.00

## 2019-07-29 HISTORY — DX: Chronic kidney disease, unspecified: N18.9

## 2019-07-29 HISTORY — DX: Anxiety disorder, unspecified: F41.9

## 2019-07-29 HISTORY — DX: Acute myocardial infarction, unspecified: I21.9

## 2019-07-29 HISTORY — PX: BREAST LUMPECTOMY WITH RADIOACTIVE SEED LOCALIZATION: SHX6424

## 2019-07-29 LAB — GLUCOSE, CAPILLARY
Glucose-Capillary: 90 mg/dL (ref 70–99)
Glucose-Capillary: 124 mg/dL — ABNORMAL HIGH (ref 70–99)

## 2019-07-29 SURGERY — BREAST LUMPECTOMY WITH RADIOACTIVE SEED LOCALIZATION
Anesthesia: General | Site: Breast | Laterality: Right

## 2019-07-29 MED ORDER — LIDOCAINE 2% (20 MG/ML) 5 ML SYRINGE
INTRAMUSCULAR | Status: AC
  Filled 2019-07-29: qty 5

## 2019-07-29 MED ORDER — PHENYLEPHRINE 40 MCG/ML (10ML) SYRINGE FOR IV PUSH (FOR BLOOD PRESSURE SUPPORT)
PREFILLED_SYRINGE | INTRAVENOUS | Status: AC
  Filled 2019-07-29: qty 10

## 2019-07-29 MED ORDER — LIDOCAINE-EPINEPHRINE (PF) 1 %-1:200000 IJ SOLN
INTRAMUSCULAR | Status: DC | PRN
  Administered 2019-07-29: 40 mL

## 2019-07-29 MED ORDER — DEXMEDETOMIDINE HCL IN NACL 200 MCG/50ML IV SOLN
INTRAVENOUS | Status: AC
  Filled 2019-07-29: qty 50

## 2019-07-29 MED ORDER — FENTANYL CITRATE (PF) 100 MCG/2ML IJ SOLN
INTRAMUSCULAR | Status: AC
  Filled 2019-07-29: qty 2

## 2019-07-29 MED ORDER — ROCURONIUM BROMIDE 10 MG/ML (PF) SYRINGE
PREFILLED_SYRINGE | INTRAVENOUS | Status: AC
  Filled 2019-07-29: qty 20

## 2019-07-29 MED ORDER — LACTATED RINGERS IV SOLN: INTRAVENOUS | Status: DC

## 2019-07-29 MED ORDER — ONDANSETRON HCL 4 MG/2ML IJ SOLN
INTRAMUSCULAR | Status: DC | PRN
  Administered 2019-07-29: 4 mg via INTRAVENOUS

## 2019-07-29 MED ORDER — DEXAMETHASONE SODIUM PHOSPHATE 4 MG/ML IJ SOLN
INTRAMUSCULAR | Status: DC | PRN
  Administered 2019-07-29: 4 mg via INTRAVENOUS

## 2019-07-29 MED ORDER — ONDANSETRON HCL 4 MG/2ML IJ SOLN
INTRAMUSCULAR | Status: AC
  Filled 2019-07-29: qty 2

## 2019-07-29 MED ORDER — CHLORHEXIDINE GLUCONATE CLOTH 2 % EX PADS: 6.0000 | MEDICATED_PAD | Freq: Once | CUTANEOUS | Status: DC

## 2019-07-29 MED ORDER — PHENYLEPHRINE 40 MCG/ML (10ML) SYRINGE FOR IV PUSH (FOR BLOOD PRESSURE SUPPORT)
PREFILLED_SYRINGE | INTRAVENOUS | Status: DC | PRN
  Administered 2019-07-29 (×3): 80 ug via INTRAVENOUS
  Administered 2019-07-29: 120 ug via INTRAVENOUS

## 2019-07-29 MED ORDER — FENTANYL CITRATE (PF) 100 MCG/2ML IJ SOLN: 50.0000 ug | INTRAMUSCULAR | Status: DC | PRN

## 2019-07-29 MED ORDER — FENTANYL CITRATE (PF) 100 MCG/2ML IJ SOLN
INTRAMUSCULAR | Status: DC | PRN
  Administered 2019-07-29 (×2): 25 ug via INTRAVENOUS
  Administered 2019-07-29: 50 ug via INTRAVENOUS

## 2019-07-29 MED ORDER — PROPOFOL 10 MG/ML IV BOLUS
INTRAVENOUS | Status: DC | PRN
  Administered 2019-07-29: 160 mg via INTRAVENOUS

## 2019-07-29 MED ORDER — EPHEDRINE SULFATE-NACL 50-0.9 MG/10ML-% IV SOSY
PREFILLED_SYRINGE | INTRAVENOUS | Status: DC | PRN
  Administered 2019-07-29: 10 mg via INTRAVENOUS
  Administered 2019-07-29: 5 mg via INTRAVENOUS

## 2019-07-29 MED ORDER — CIPROFLOXACIN IN D5W 400 MG/200ML IV SOLN
INTRAVENOUS | Status: AC
  Filled 2019-07-29: qty 200

## 2019-07-29 MED ORDER — FENTANYL CITRATE (PF) 100 MCG/2ML IJ SOLN: 25.0000 ug | INTRAMUSCULAR | Status: DC | PRN

## 2019-07-29 MED ORDER — LIDOCAINE 2% (20 MG/ML) 5 ML SYRINGE
INTRAMUSCULAR | Status: DC | PRN
  Administered 2019-07-29: 60 mg via INTRAVENOUS

## 2019-07-29 MED ORDER — DEXAMETHASONE SODIUM PHOSPHATE 10 MG/ML IJ SOLN
INTRAMUSCULAR | Status: AC
  Filled 2019-07-29: qty 1

## 2019-07-29 MED ORDER — ACETAMINOPHEN 500 MG PO TABS
1000.0000 mg | ORAL_TABLET | ORAL | Status: AC
  Administered 2019-07-29: 1000 mg via ORAL

## 2019-07-29 MED ORDER — GLYCOPYRROLATE PF 0.2 MG/ML IJ SOSY
PREFILLED_SYRINGE | INTRAMUSCULAR | Status: AC
  Filled 2019-07-29: qty 2

## 2019-07-29 MED ORDER — ACETAMINOPHEN 500 MG PO TABS
ORAL_TABLET | ORAL | Status: AC
  Filled 2019-07-29: qty 2

## 2019-07-29 MED ORDER — LIDOCAINE 2% (20 MG/ML) 5 ML SYRINGE
INTRAMUSCULAR | Status: AC
  Filled 2019-07-29: qty 15

## 2019-07-29 MED ORDER — CIPROFLOXACIN IN D5W 400 MG/200ML IV SOLN
400.0000 mg | INTRAVENOUS | Status: AC
  Administered 2019-07-29: 400 mg via INTRAVENOUS

## 2019-07-29 MED ORDER — MIDAZOLAM HCL 2 MG/2ML IJ SOLN: 1.0000 mg | INTRAMUSCULAR | Status: DC | PRN

## 2019-07-29 MED ORDER — EPHEDRINE 5 MG/ML INJ
INTRAVENOUS | Status: AC
  Filled 2019-07-29: qty 30

## 2019-07-29 MED ORDER — OXYCODONE HCL 5 MG PO TABS
2.5000 mg | ORAL_TABLET | Freq: Four times a day (QID) | ORAL | 0 refills | Status: DC | PRN
Start: 2019-07-29 — End: 2019-08-31

## 2019-07-29 SURGICAL SUPPLY — 56 items
ADH SKN CLS APL DERMABOND .7 (GAUZE/BANDAGES/DRESSINGS) ×1
APL PRP STRL LF DISP 70% ISPRP (MISCELLANEOUS) ×1
BINDER BREAST LRG (GAUZE/BANDAGES/DRESSINGS) IMPLANT
BINDER BREAST MEDIUM (GAUZE/BANDAGES/DRESSINGS) IMPLANT
BINDER BREAST XLRG (GAUZE/BANDAGES/DRESSINGS) IMPLANT
BINDER BREAST XXLRG (GAUZE/BANDAGES/DRESSINGS) IMPLANT
BLADE SURG 10 STRL SS (BLADE) ×3 IMPLANT
BLADE SURG 15 STRL LF DISP TIS (BLADE) IMPLANT
BLADE SURG 15 STRL SS (BLADE)
CANISTER SUC SOCK COL 7IN (MISCELLANEOUS) IMPLANT
CANISTER SUCT 1200ML W/VALVE (MISCELLANEOUS) IMPLANT
CHLORAPREP W/TINT 26 (MISCELLANEOUS) ×3 IMPLANT
CLIP VESOCCLUDE LG 6/CT (CLIP) ×3 IMPLANT
CLIP VESOCCLUDE MED 6/CT (CLIP) IMPLANT
CLOSURE WOUND 1/2 X4 (GAUZE/BANDAGES/DRESSINGS) ×1
COVER BACK TABLE 60X90IN (DRAPES) ×3 IMPLANT
COVER MAYO STAND STRL (DRAPES) ×3 IMPLANT
COVER PROBE W GEL 5X96 (DRAPES) ×3 IMPLANT
COVER WAND RF STERILE (DRAPES) IMPLANT
DECANTER SPIKE VIAL GLASS SM (MISCELLANEOUS) IMPLANT
DERMABOND ADVANCED (GAUZE/BANDAGES/DRESSINGS) ×2
DERMABOND ADVANCED .7 DNX12 (GAUZE/BANDAGES/DRESSINGS) ×1 IMPLANT
DRAPE LAPAROSCOPIC ABDOMINAL (DRAPES) ×3 IMPLANT
DRAPE UTILITY XL STRL (DRAPES) ×3 IMPLANT
DRSG PAD ABDOMINAL 8X10 ST (GAUZE/BANDAGES/DRESSINGS) ×3 IMPLANT
ELECT COATED BLADE 2.86 ST (ELECTRODE) ×3 IMPLANT
ELECT REM PT RETURN 9FT ADLT (ELECTROSURGICAL) ×3
ELECTRODE REM PT RTRN 9FT ADLT (ELECTROSURGICAL) ×1 IMPLANT
GAUZE SPONGE 4X4 12PLY STRL LF (GAUZE/BANDAGES/DRESSINGS) ×5 IMPLANT
GLOVE BIO SURGEON STRL SZ 6 (GLOVE) IMPLANT
GLOVE BIOGEL PI IND STRL 6.5 (GLOVE) ×1 IMPLANT
GLOVE BIOGEL PI INDICATOR 6.5 (GLOVE) ×2
GOWN STRL REUS W/ TWL LRG LVL3 (GOWN DISPOSABLE) ×1 IMPLANT
GOWN STRL REUS W/TWL 2XL LVL3 (GOWN DISPOSABLE) ×3 IMPLANT
GOWN STRL REUS W/TWL LRG LVL3 (GOWN DISPOSABLE) ×3
KIT MARKER MARGIN INK (KITS) ×3 IMPLANT
LIGHT WAVEGUIDE WIDE FLAT (MISCELLANEOUS) IMPLANT
NEEDLE HYPO 25X1 1.5 SAFETY (NEEDLE) ×3 IMPLANT
NS IRRIG 1000ML POUR BTL (IV SOLUTION) ×3 IMPLANT
PENCIL SMOKE EVACUATOR (MISCELLANEOUS) ×3 IMPLANT
SET BASIN DAY SURGERY F.S. (CUSTOM PROCEDURE TRAY) ×3 IMPLANT
SLEEVE SCD COMPRESS KNEE MED (MISCELLANEOUS) ×3 IMPLANT
SPONGE LAP 18X18 RF (DISPOSABLE) ×3 IMPLANT
STRIP CLOSURE SKIN 1/2X4 (GAUZE/BANDAGES/DRESSINGS) ×2 IMPLANT
SUT MNCRL AB 4-0 PS2 18 (SUTURE) ×3 IMPLANT
SUT SILK 2 0 SH (SUTURE) IMPLANT
SUT VIC AB 2-0 SH 27 (SUTURE) ×3
SUT VIC AB 2-0 SH 27XBRD (SUTURE) ×1 IMPLANT
SUT VIC AB 3-0 SH 27 (SUTURE) ×3
SUT VIC AB 3-0 SH 27X BRD (SUTURE) ×1 IMPLANT
SYR CONTROL 10ML LL (SYRINGE) ×3 IMPLANT
TOWEL GREEN STERILE FF (TOWEL DISPOSABLE) ×3 IMPLANT
TRAY FAXITRON CT DISP (TRAY / TRAY PROCEDURE) ×6 IMPLANT
TUBE CONNECTING 20'X1/4 (TUBING)
TUBE CONNECTING 20X1/4 (TUBING) IMPLANT
YANKAUER SUCT BULB TIP NO VENT (SUCTIONS) IMPLANT

## 2019-07-29 NOTE — Transfer of Care (Signed)
Immediate Anesthesia Transfer of Care Note  Patient: Evelyn Morrison  Procedure(s) Performed: RIGHT BREAST LUMPECTOMY WITH RADIOACTIVE SEED LOCALIZATION (Right Breast)  Patient Location: PACU  Anesthesia Type:General  Level of Consciousness: awake, alert  and oriented  Airway & Oxygen Therapy: Patient Spontanous Breathing and Patient connected to face mask oxygen  Post-op Assessment: Report given to RN and Post -op Vital signs reviewed and stable  Post vital signs: Reviewed and stable  Last Vitals:  Vitals Value Taken Time  BP 116/67 07/29/19 1450  Temp    Pulse 106 07/29/19 1451  Resp 18 07/29/19 1451  SpO2 100 % 07/29/19 1451  Vitals shown include unvalidated device data.  Last Pain:  Vitals:   07/29/19 1304  TempSrc: Temporal  PainSc: 0-No pain      Patients Stated Pain Goal: 0 (36/[id number]/83 [id number])  Complications: No apparent anesthesia complications

## 2019-07-29 NOTE — Discharge Instructions (Addendum)
[place] Office Phone Number [phone]  BREAST BIOPSY/ PARTIAL MASTECTOMY: POST OP INSTRUCTIONS  Always review your discharge instruction sheet given to you by the facility where your surgery was performed.  IF YOU HAVE DISABILITY OR FAMILY LEAVE FORMS, YOU MUST BRING THEM TO THE OFFICE FOR PROCESSING.  DO NOT GIVE THEM TO YOUR DOCTOR.  1. A prescription for pain medication may be given to you upon discharge.  Take your pain medication as prescribed, if needed.  If narcotic pain medicine is not needed, then you may take acetaminophen (Tylenol) or ibuprofen (Advil) as needed. *You had 1000 mg of tylenol at 1:15pm 2. Take your usually prescribed medications unless otherwise directed 3. If you need a refill on your pain medication, please contact your pharmacy.  They will contact our office to request authorization.  Prescriptions will not be filled after 5pm or on week-ends. 4. You should eat very light the first 24 hours after surgery, such as soup, crackers, pudding, etc.  Resume your normal diet the day after surgery. 5. Most patients will experience some swelling and bruising in the breast.  Ice packs and a good support bra will help.  Swelling and bruising can take several days to resolve.  6. It is common to experience some constipation if taking pain medication after surgery.  Increasing fluid intake and taking a stool softener will usually help or prevent this problem from occurring.  A mild laxative (Milk of Magnesia or Miralax) should be taken according to package directions if there are no bowel movements after 48 hours. 7. Unless discharge instructions indicate otherwise, you may remove your bandages 48 hours after surgery, and you may shower at that time.  You may have steri-strips (small skin tapes) in place directly over the incision.  These strips should be left on the skin for 7-10 days.   Any sutures or staples will be removed at the office during your follow-up  visit. 8. ACTIVITIES:  You may resume regular daily activities (gradually increasing) beginning the next day.  Wearing a good support bra or sports bra (or the breast binder) minimizes pain and swelling.  You may have sexual intercourse when it is comfortable. a. You may drive when you no longer are taking prescription pain medication, you can comfortably wear a seatbelt, and you can safely maneuver your car and apply brakes. b. RETURN TO WORK:  __________1 week_______________ 9. You should see your doctor in the office for a follow-up appointment approximately two weeks after your surgery.  Your doctor's nurse will typically make your follow-up appointment when she calls you with your pathology report.  Expect your pathology report 2-3 business days after your surgery.  You may call to check if you do not hear from [place] after three days.   WHEN TO CALL YOUR DOCTOR: 1. Fever over 101.0 2. Nausea and/or vomiting. 3. Extreme swelling or bruising. 4. Continued bleeding from incision. 5. Increased pain, redness, or drainage from the incision.  The clinic staff is available to answer your questions during regular business hours.  Please don't hesitate to call and ask to speak to one of the nurses for clinical concerns.  If you have a medical emergency, go to the nearest emergency room or call 911.  A surgeon from [facility] is always on call at the hospital.  For further questions, please visit [email or web address]    Post Anesthesia Home Care Instructions  Activity: Get plenty of rest for the remainder of the day. A  responsible individual must stay with you for 24 hours following the procedure.  For the next 24 hours, DO NOT: -Drive a car -Paediatric nurse -Drink alcoholic beverages -Take any medication unless instructed by your physician -Make any legal decisions or sign important papers.  Meals: Start with liquid foods such as gelatin or soup. Progress to regular foods  as tolerated. Avoid greasy, spicy, heavy foods. If nausea and/or vomiting occur, drink only clear liquids until the nausea and/or vomiting subsides. Call your physician if vomiting continues.  Special Instructions/Symptoms: Your throat may feel dry or sore from the anesthesia or the breathing tube placed in your throat during surgery. If this causes discomfort, gargle with warm salt water. The discomfort should disappear within 24 hours.  If you had a scopolamine patch placed behind your ear for the management of post- operative nausea and/or vomiting:  1. The medication in the patch is effective for 72 hours, after which it should be removed.  Wrap patch in a tissue and discard in the trash. Wash hands thoroughly with soap and water. 2. You may remove the patch earlier than 72 hours if you experience unpleasant side effects which may include dry mouth, dizziness or visual disturbances. 3. Avoid touching the patch. Wash your hands with soap and water after contact with the patch.

## 2019-07-29 NOTE — Interval H&P Note (Signed)
History and Physical Interval Note:  07/29/2019 1:22 PM  Evelyn Morrison  has presented today for surgery, with the diagnosis of RIGHT BREAST CANCER.  The various methods of treatment have been discussed with the patient and family. After consideration of risks, benefits and other options for treatment, the patient has consented to  Procedure(s): RIGHT BREAST LUMPECTOMY WITH RADIOACTIVE SEED LOCALIZATION (Right) as a surgical intervention.  The patient's history has been reviewed, patient examined, no change in status, stable for surgery.  I have reviewed the patient's chart and labs.  Questions were answered to the patient's satisfaction.     [name]

## 2019-07-29 NOTE — Anesthesia Preprocedure Evaluation (Signed)
Anesthesia Evaluation  Patient identified by MRN, date of birth, ID band Patient awake    Reviewed: Allergy & Precautions, NPO status , Patient's Chart, lab work & pertinent test results  Airway Mallampati: II  TM Distance: >3 FB     Dental   Pulmonary neg pulmonary ROS, former smoker,    breath sounds clear to auscultation       Cardiovascular hypertension, + CAD and + Past MI   Rhythm:Regular Rate:Normal     Neuro/Psych    GI/Hepatic Neg liver ROS, hiatal hernia, GERD  ,  Endo/Other  diabetes  Renal/GU Renal disease     Musculoskeletal   Abdominal   Peds  Hematology   Anesthesia Other Findings   Reproductive/Obstetrics                             Anesthesia Physical Anesthesia Plan  ASA: III  Anesthesia Plan: General   Post-op Pain Management:    Induction: Intravenous  PONV Risk Score and Plan: 3 and Ondansetron, Dexamethasone and Midazolam  Airway Management Planned: LMA  Additional Equipment:   Intra-op Plan:   Post-operative Plan: Extubation in OR  Informed Consent:     Dental advisory given  Plan Discussed with: CRNA and Anesthesiologist  Anesthesia Plan Comments:         Anesthesia Quick Evaluation

## 2019-07-29 NOTE — Anesthesia Postprocedure Evaluation (Signed)
Anesthesia Post Note  Patient: Evelyn Morrison  Procedure(s) Performed: RIGHT BREAST LUMPECTOMY WITH RADIOACTIVE SEED LOCALIZATION (Right Breast)     Patient location during evaluation: PACU Anesthesia Type: General Level of consciousness: awake Pain management: pain level controlled Vital Signs Assessment: post-procedure vital signs reviewed and stable Respiratory status: spontaneous breathing Cardiovascular status: stable Postop Assessment: no apparent nausea or vomiting Anesthetic complications: no    Last Vitals:  Vitals:   07/29/19 1304 07/29/19 1450  BP: 117/76 116/67  Pulse: 88 (!) 106  Resp: 18 18  Temp: 36.4 C (!) 36.4 C  SpO2: 99% 100%    Last Pain:  Vitals:   07/29/19 1450  TempSrc:   PainSc: Asleep                 Jacelynn Hayton

## 2019-07-29 NOTE — Anesthesia Procedure Notes (Signed)
Procedure Name: LMA Insertion Date/Time: 07/29/2019 1:47 PM Performed by: Genelle Bal, CRNA Pre-anesthesia Checklist: Patient identified, Emergency Drugs available, Suction available and Patient being monitored Patient Re-evaluated:Patient Re-evaluated prior to induction Oxygen Delivery Method: Circle system utilized Preoxygenation: Pre-oxygenation with 100% oxygen Induction Type: IV induction Ventilation: Mask ventilation without difficulty LMA: LMA inserted LMA Size: 4.0 Number of attempts: 1 Airway Equipment and Method: Bite block Placement Confirmation: positive ETCO2 Tube secured with: Tape Dental Injury: Teeth and Oropharynx as per pre-operative assessment

## 2019-07-29 NOTE — Op Note (Signed)
Right Breast Radioactive seed localized lumpectomy  Indications: This patient presents with history of right breast cancer, cTis quadrant, UOQ receptors ER/PR negative  Pre-operative Diagnosis: right breast cancef  Post-operative Diagnosis: Same  Surgeon: [name]   Anesthesia: General endotracheal anesthesia  ASA Class: 3  Procedure Details  The patient was seen in the Holding Room. The risks, benefits, complications, treatment options, and expected outcomes were discussed with the patient. The possibilities of bleeding, infection, the need for additional procedures, failure to diagnose a condition, and creating a complication requiring other procedures or operations were discussed with the patient. The patient concurred with the proposed plan, giving informed consent.  The site of surgery properly noted/marked. The patient was taken to Operating Room # 8, identified, and the procedure verified as right breast seed localized lumpectomy.  The right breast and chest were prepped and draped in standard fashion. A transverse lateral incision was made near the previously placed radioactive seed.  Dissection was carried down around the point of maximum signal intensity. The first seed was superficial and was carried back with the needle close to the skin.  The cautery was used to perform the dissection.   The specimen was inked with the margin marker paint kit.    Specimen radiography confirmed inclusion of the seed.  Deeper in the breast, the seed located near the cancer was present.  This tissue was dissected out with the cautery.  The specimen was marked with the paint kit and the specimen mammogram contained the mammographic lesion the clip, and the seed.  The background signal in the breast was zero.  Hemostasis was achieved with cautery.  The cavity was marked with clips on each border other than the anterior border.  Local was injected into the breast.  The deeper aspect of the breast was  closed with 2-0 vicryl.  The wound was irrigated and closed with 3-0 vicryl interrupted deep dermal sutures and 4-0 monocryl running subcuticular suture.      Sterile dressings were applied. At the end of the operation, all sponge, instrument, and needle counts were correct.   Findings: Seed in small amount of breast tissue for the superficial seed.  The deep seed and clip in the specimen mammogram.  Deep margin is pectoralis.     Estimated Blood Loss:  min         Specimens: left breast tissue with seed         Complications:  None; patient tolerated the procedure well.         Disposition: PACU - hemodynamically stable.         Condition: stable

## 2019-08-01 ENCOUNTER — Encounter: Payer: Self-pay | Admitting: *Deleted

## 2019-08-02 LAB — SURGICAL PATHOLOGY

## 2019-08-04 ENCOUNTER — Encounter: Payer: Self-pay | Admitting: *Deleted

## 2019-08-05 ENCOUNTER — Telehealth: Payer: Self-pay | Admitting: General Surgery

## 2019-08-05 ENCOUNTER — Other Ambulatory Visit: Payer: Self-pay | Admitting: General Surgery

## 2019-08-05 NOTE — Telephone Encounter (Signed)
Left message on machine with patient.  Also talked to daughter.  Reviewed need for reexcision.

## 2019-08-06 ENCOUNTER — Other Ambulatory Visit (HOSPITAL_COMMUNITY): Payer: Medicare HMO

## 2019-08-10 ENCOUNTER — Encounter: Payer: Self-pay | Admitting: *Deleted

## 2019-08-15 ENCOUNTER — Ambulatory Visit: Payer: Medicare HMO

## 2019-08-15 ENCOUNTER — Ambulatory Visit: Payer: Medicare HMO | Admitting: Radiation Oncology

## 2019-08-15 ENCOUNTER — Encounter: Payer: Self-pay | Admitting: *Deleted

## 2019-08-22 ENCOUNTER — Encounter: Payer: Self-pay | Admitting: *Deleted

## 2019-08-24 NOTE — Progress Notes (Addendum)
[id number] [place], [place] [address] [place] [address] Phone: [phone] Fax: [phone]      Your procedure is scheduled on June 2  Report to [facility] Main Entrance "A" at 0700 A.M., and check in at the Admitting office.  Call this number if you have problems the morning of surgery:  [phone]  Call [phone] if you have any questions prior to your surgery date Monday-Friday 8am-4pm    Remember:  Do not eat  after midnight the night before your surgery  You may drink clear liquids until 0600 the morning of your surgery.   Clear liquids allowed are: Water, Non-Citrus Juices (without pulp), Carbonated Beverages, Clear Tea, Black Coffee Only, and Gatorade  Please complete your PRE-SURGERY ENSURE that was provided to you by 0600 am the morning of surgery.  Please, if able, drink it in one setting. DO NOT SIP.     Take these medicines the morning of surgery with A SIP OF WATER  acetaminophen (TYLENOL) if needed albuterol (PROVENTIL HFA;VENTOLIN HFA) Please bring all inhalers with you the day of surgery.  gabapentin (NEURONTIN) MYRBETRIQ  Follow your surgeon's instructions on when to stop Aspirin.  If no instructions were given by your surgeon then you will need to call the office to get those instructions.    As of today, STOP taking any Aleve, Naproxen, Ibuprofen, Motrin, Advil, Goody's, BC's, all herbal medications, fish oil, and all vitamins.    WHAT DO I DO ABOUT MY DIABETES MEDICATION?   Marland Kitchen Do not take oral diabetes medicines (pills) the morning of surgery.glipiZIDE (GLUCOTROL   HOW TO MANAGE YOUR DIABETES BEFORE AND AFTER SURGERY  Why is it important to control my blood sugar before and after surgery? . Improving blood sugar levels before and after surgery helps healing and can limit problems. . A way of improving blood sugar control is eating a healthy diet by: o   Eating less sugar and carbohydrates o  Increasing activity/exercise o  Talking with your doctor about reaching your blood sugar goals . High blood sugars (greater than 180 mg/dL) can raise your risk of infections and slow your recovery, so you will need to focus on controlling your diabetes during the weeks before surgery. . Make sure that the doctor who takes care of your diabetes knows about your planned surgery including the date and location.  How do I manage my blood sugar before surgery? . Check your blood sugar at least 4 times a day, starting 2 days before surgery, to make sure that the level is not too high or low. . Check your blood sugar the morning of your surgery when you wake up and every 2 hours until you get to the Short Stay unit. o If your blood sugar is less than 70 mg/dL, you will need to treat for low blood sugar: - Do not take insulin. - Treat a low blood sugar (less than 70 mg/dL) with  cup of clear juice (cranberry or apple), 4 glucose tablets, OR glucose gel. - Recheck blood sugar in 15 minutes after treatment (to make sure it is greater than 70 mg/dL). If your blood sugar is not greater than 70 mg/dL on recheck, call [phone] for further instructions. . Report your blood sugar to the short stay nurse when you get to Short Stay.  . If you are admitted to the hospital after surgery: o Your blood sugar will be checked  by the staff and you will probably be given insulin after surgery (instead of oral diabetes medicines) to make sure you have good blood sugar levels. o The goal for blood sugar control after surgery is 80-180 mg/dL.                     Do not wear jewelry, make up, or nail polish            Do not wear lotions, powders, perfumes/colognes, or deodorant.            Do not shave 48 hours prior to surgery.  Men may shave face and neck.            Do not bring valuables to the hospital.            [facility] is not responsible for any belongings or  valuables.  Do NOT Smoke (Tobacco/Vapping) or drink Alcohol 24 hours prior to your procedure If you use a CPAP at night, you may bring all equipment for your overnight stay.   Contacts, glasses, dentures or bridgework may not be worn into surgery.      For patients admitted to the hospital, discharge time will be determined by your treatment team.   Patients discharged the day of surgery will not be allowed to drive home, and someone needs to stay with them for 24 hours.    Special instructions:   [place]- Preparing For Surgery  Before surgery, you can play an important role. Because skin is not sterile, your skin needs to be as free of germs as possible. You can reduce the number of germs on your skin by washing with CHG (chlorahexidine gluconate) Soap before surgery.  CHG is an antiseptic cleaner which kills germs and bonds with the skin to continue killing germs even after washing.    Oral Hygiene is also important to reduce your risk of infection.  Remember - BRUSH YOUR TEETH THE MORNING OF SURGERY WITH YOUR REGULAR TOOTHPASTE  Please do not use if you have an allergy to CHG or antibacterial soaps. If your skin becomes reddened/irritated stop using the CHG.  Do not shave (including legs and underarms) for at least 48 hours prior to first CHG shower. It is OK to shave your face.  Please follow these instructions carefully.   1. Shower the NIGHT BEFORE SURGERY and the MORNING OF SURGERY with CHG Soap.   2. If you chose to wash your hair, wash your hair first as usual with your normal shampoo.  3. After you shampoo, rinse your hair and body thoroughly to remove the shampoo.  4. Use CHG as you would any other liquid soap. You can apply CHG directly to the skin and wash gently with a scrungie or a clean washcloth.   5. Apply the CHG Soap to your body ONLY FROM THE NECK DOWN.  Do not use on open wounds or open sores. Avoid contact with your eyes, ears, mouth and genitals (private  parts). Wash Face and genitals (private parts)  with your normal soap.   6. Wash thoroughly, paying special attention to the area where your surgery will be performed.  7. Thoroughly rinse your body with warm water from the neck down.  8. DO NOT shower/wash with your normal soap after using and rinsing off the CHG Soap.  9. Pat yourself dry with a CLEAN TOWEL.  10. Wear CLEAN PAJAMAS to bed the night before surgery, wear comfortable clothes the morning  of surgery  11. Place CLEAN SHEETS on your bed the night of your first shower and DO NOT SLEEP WITH PETS.   Day of Surgery:   Do not apply any deodorants/lotions.  Please wear clean clothes to the hospital/surgery center.   Remember to brush your teeth WITH YOUR REGULAR TOOTHPASTE.   Please read over the following fact sheets that you were given.

## 2019-08-25 ENCOUNTER — Encounter (HOSPITAL_COMMUNITY): Payer: Self-pay

## 2019-08-25 ENCOUNTER — Encounter: Payer: Self-pay | Admitting: *Deleted

## 2019-08-25 ENCOUNTER — Encounter (HOSPITAL_COMMUNITY)
Admission: RE | Admit: 2019-08-25 | Discharge: 2019-08-25 | Disposition: A | Discharge status: Home / Self Care | Payer: Medicare HMO | Source: Ambulatory Visit | Attending: General Surgery | Admitting: General Surgery

## 2019-08-25 ENCOUNTER — Other Ambulatory Visit: Payer: Self-pay

## 2019-08-25 DIAGNOSIS — Z01812 Encounter for preprocedural laboratory examination: Secondary | ICD-10-CM | POA: Diagnosis not present

## 2019-08-25 LAB — BASIC METABOLIC PANEL
Anion gap: 9 (ref 5–15)
BUN: 18 mg/dL (ref 8–23)
CO2: 24 mmol/L (ref 22–32)
Calcium: 9.6 mg/dL (ref 8.9–10.3)
Chloride: 108 mmol/L (ref 98–111)
Creatinine, Ser: 1.31 mg/dL — ABNORMAL HIGH (ref 0.44–1.00)
GFR calc Af Amer: 45 mL/min — ABNORMAL LOW (ref >60)
GFR calc non Af Amer: 39 mL/min — ABNORMAL LOW (ref >60)
Glucose, Bld: 110 mg/dL — ABNORMAL HIGH (ref 70–99)
Potassium: 3.7 mmol/L (ref 3.5–5.1)
Sodium: 141 mmol/L (ref 135–145)

## 2019-08-25 LAB — HEMOGLOBIN A1C
Hgb A1c MFr Bld: 6.8 % — ABNORMAL HIGH (ref 4.8–5.6)
Mean Plasma Glucose: 148.46 mg/dL

## 2019-08-25 LAB — CBC
HCT: 36.9 % (ref 36.0–46.0)
Hemoglobin: 11.7 g/dL — ABNORMAL LOW (ref 12.0–15.0)
MCH: 29.8 pg (ref 26.0–34.0)
MCHC: 31.7 g/dL (ref 30.0–36.0)
MCV: 94.1 fL (ref 80.0–100.0)
Platelets: 195 10*3/uL (ref 150–400)
RBC: 3.92 MIL/uL (ref 3.87–5.11)
RDW: 14.2 % (ref 11.5–15.5)
WBC: 5 10*3/uL (ref 4.0–10.5)
nRBC: 0 % (ref 0.0–0.2)

## 2019-08-25 LAB — GLUCOSE, CAPILLARY: Glucose-Capillary: 106 mg/dL — ABNORMAL HIGH (ref 70–99)

## 2019-08-25 NOTE — Progress Notes (Addendum)
PCP: Dr. Vista Lawman Cardiologist: Dr. Burt Knack   EKG: 07/27/19 CXR: n/a ECHO: 05/29/2017 Stress Test: 05/29/2017 Cardiac Cath: 12/2002  Fasting Blood Sugar- 65-95 Checks Blood Sugar 3  times a day CBG today: 106  Patient denies shortness of breath, fever, cough, and chest pain at PAT appointment.  Patient verbalized understanding of instructions provided today at the PAT appointment.  Patient asked to review instructions at home and day of surgery.   Chart forwarded to anesthesia  Labs ordered under anesthesia protocol during downtime procedures

## 2019-08-25 NOTE — Progress Notes (Signed)
[id number] [place],  - [place] [address]  [place] [address] Phone: [phone] Fax: [phone]      Your procedure is scheduled on August 31, 2019.  Report to [facility] Main Entrance "A" at 07:00 A.M., and check in at the Admitting office.  Call this number if you have problems the morning of surgery:  [phone]  Call [phone] if you have any questions prior to your surgery date Monday-Friday 8am-4pm    Remember:  Do not eat after midnight the night before your surgery  You may drink clear liquids until 06:00 am the morning of your surgery.   Clear liquids allowed are: Water, Non-Citrus Juices (without pulp), Carbonated Beverages, Clear Tea, Black Coffee Only, and Gatorade  Please complete your G2 Gatorade that was provided to you by 0600 am the morning of surgery.  Please, if able, drink it in one setting. DO NOT SIP.     Take these medicines the morning of surgery with A SIP OF WATER: gabapentin (NEURONTIN) MYRBETRIQ acetaminophen (TYLENOL) if needed albuterol (PROVENTIL HFA;VENTOLIN HFA) if needed Please bring all inhalers with you the day of surgery.  Nitroglycerin (NITROSTAT) if needed  Follow your surgeon's instructions on when to stop Aspirin.  If no instructions were given by your surgeon then you will need to call the office to get those instructions.    As of today, STOP taking any Aleve, Naproxen, Ibuprofen, Motrin, Advil, Goody's, BC's, all herbal medications, fish oil, and all vitamins.   WHAT DO I DO ABOUT MY DIABETES MEDICATION?  Marland Kitchen Do not take oral diabetes medicines (pills) the morning of surgery. DO NOT TAKE glipiZIDE (GLUCOTROL) the morning of surgery   HOW TO MANAGE YOUR DIABETES BEFORE AND AFTER SURGERY  Why is it important to control my blood sugar before and after surgery? . Improving blood sugar levels before and after surgery helps healing and can limit  problems. . A way of improving blood sugar control is eating a healthy diet by: o  Eating less sugar and carbohydrates o  Increasing activity/exercise o  Talking with your doctor about reaching your blood sugar goals . High blood sugars (greater than 180 mg/dL) can raise your risk of infections and slow your recovery, so you will need to focus on controlling your diabetes during the weeks before surgery. . Make sure that the doctor who takes care of your diabetes knows about your planned surgery including the date and location.  How do I manage my blood sugar before surgery? . Check your blood sugar at least 4 times a day, starting 2 days before surgery, to make sure that the level is not too high or low. . Check your blood sugar the morning of your surgery when you wake up and every 2 hours until you get to the Short Stay unit. o If your blood sugar is less than 70 mg/dL, you will need to treat for low blood sugar: - Do not take insulin. - Treat a low blood sugar (less than 70 mg/dL) with  cup of clear juice (cranberry or apple), 4 glucose tablets, OR glucose gel. - Recheck blood sugar in 15 minutes after treatment (to make sure it is greater than 70 mg/dL). If your blood sugar is not greater than 70 mg/dL on recheck, call [phone] for further instructions. . Report your blood sugar to the short stay nurse when you get to Short Stay.  . If you are admitted  to the hospital after surgery: o Your blood sugar will be checked by the staff and you will probably be given insulin after surgery (instead of oral diabetes medicines) to make sure you have good blood sugar levels. o The goal for blood sugar control after surgery is 80-180 mg/dL.                     Do not wear jewelry, make up, or nail polish            Do not wear lotions, powders, perfumes/colognes, or deodorant.            Do not shave 48 hours prior to surgery.              Do not bring valuables to the hospital.             [facility] is not responsible for any belongings or valuables.  Do NOT Smoke (Tobacco/Vapping) or drink Alcohol 24 hours prior to your procedure If you use a CPAP at night, you may bring all equipment for your overnight stay.   Contacts, glasses, dentures or bridgework may not be worn into surgery.      For patients admitted to the hospital, discharge time will be determined by your treatment team.   Patients discharged the day of surgery will not be allowed to drive home, and someone needs to stay with them for 24 hours.    Special instructions:   [place]- Preparing For Surgery  Before surgery, you can play an important role. Because skin is not sterile, your skin needs to be as free of germs as possible. You can reduce the number of germs on your skin by washing with CHG (chlorahexidine gluconate) Soap before surgery.  CHG is an antiseptic cleaner which kills germs and bonds with the skin to continue killing germs even after washing.    Oral Hygiene is also important to reduce your risk of infection.  Remember - BRUSH YOUR TEETH THE MORNING OF SURGERY WITH YOUR REGULAR TOOTHPASTE  Please do not use if you have an allergy to CHG or antibacterial soaps. If your skin becomes reddened/irritated stop using the CHG.  Do not shave (including legs and underarms) for at least 48 hours prior to first CHG shower. It is OK to shave your face.  Please follow these instructions carefully.   1. Shower the NIGHT BEFORE SURGERY and the MORNING OF SURGERY with CHG Soap.   2. If you chose to wash your hair, wash your hair first as usual with your normal shampoo.  3. After you shampoo, rinse your hair and body thoroughly to remove the shampoo.  4. Use CHG as you would any other liquid soap. You can apply CHG directly to the skin and wash gently with a scrungie or a clean washcloth.   5. Apply the CHG Soap to your body ONLY FROM THE NECK DOWN.  Do not use on open wounds or open sores. Avoid  contact with your eyes, ears, mouth and genitals (private parts). Wash Face and genitals (private parts)  with your normal soap.   6. Wash thoroughly, paying special attention to the area where your surgery will be performed.  7. Thoroughly rinse your body with warm water from the neck down.  8. DO NOT shower/wash with your normal soap after using and rinsing off the CHG Soap.  9. Pat yourself dry with a CLEAN TOWEL.  10. Wear CLEAN PAJAMAS to bed the night  before surgery, wear comfortable clothes the morning of surgery  11. Place CLEAN SHEETS on your bed the night of your first shower and DO NOT SLEEP WITH PETS.   Day of Surgery:   Do not apply any deodorants/lotions.  Please wear clean clothes to the hospital/surgery center.   Remember to brush your teeth WITH YOUR REGULAR TOOTHPASTE.   Please read over the following fact sheets that you were given.

## 2019-08-26 NOTE — Anesthesia Preprocedure Evaluation (Addendum)
Anesthesia Evaluation  Patient identified by MRN, date of birth, ID band Patient awake    Reviewed: Allergy & Precautions, NPO status , Patient's Chart, lab work & pertinent test results  History of Anesthesia Complications Negative for: history of anesthetic complications  Airway Mallampati: II  TM Distance: >3 FB Neck ROM: Full    Dental  (+) Edentulous Upper, Partial Lower, Dental Advisory Given   Pulmonary former smoker,    Pulmonary exam normal        Cardiovascular hypertension, Pt. on medications + CAD, + Past MI and + Cardiac Stents  Normal cardiovascular exam   '19 Myoperfusion - Nuclear stress EF: 74%. There was no ST segment deviation noted during stress. The study is normal. This is a low risk study. The LVEF is hyperdynamic, >65%    Neuro/Psych PSYCHIATRIC DISORDERS Anxiety negative neurological ROS     GI/Hepatic Neg liver ROS, hiatal hernia, GERD  Controlled, IBS    Endo/Other  diabetes, Type 2, Oral Hypoglycemic Agents Obesity   Renal/GU CRFRenal disease  Female GU complaint     Musculoskeletal  (+) Arthritis ,   Abdominal   Peds  Hematology negative hematology ROS (+)   Anesthesia Other Findings Covid neg 5/29  Reproductive/Obstetrics                           Anesthesia Physical Anesthesia Plan  ASA: III  Anesthesia Plan: General   Post-op Pain Management:    Induction: Intravenous  PONV Risk Score and Plan: 3 and Treatment may vary due to age or medical condition, Ondansetron and Dexamethasone  Airway Management Planned: LMA  Additional Equipment: None  Intra-op Plan:   Post-operative Plan: Extubation in OR  Informed Consent: I have reviewed the patients History and Physical, chart, labs and discussed the procedure including the risks, benefits and alternatives for the proposed anesthesia with the patient or authorized representative who has  indicated his/her understanding and acceptance.     Dental advisory given  Plan Discussed with: CRNA and Anesthesiologist  Anesthesia Plan Comments:       Anesthesia Quick Evaluation

## 2019-08-26 NOTE — Progress Notes (Signed)
Anesthesia Chart Review:   Case: [id number] Date/Time: 08/31/19 0845   Procedure: RE-EXCISION OF RIGHT BREAST LUMPECTOMY (Right Breast)   Anesthesia type: General   Pre-op diagnosis: right breast cancer   Location: [facility] OR ROOM 08 / [place]   Surgeons: [name], MD      DISCUSSION:  - Pt is a 79 year old with hx CAD (MI, PCI 2004), HTN, DM, CKD   VS: BP 137/87   Pulse 70   Temp 36.9 C (Oral)   Resp 20   Ht 5' (1.524 m)   Wt 71.4 kg   SpO2 100%   BMI 30.74 kg/m    PROVIDERS: - PCP is Benito Mccreedy, MD - Cardiologist is Sherren Mocha, MD. Last office visit 01/19/19 with Daune Perch, NP - [facility] for nephrology care   LABS:  - CBC and BMP were not auto-populated into chart below.  - BMP showed Cr 1.31 (baseline ~1.5), BUN 18; glucose 110; otherwise normal - CBC showed hgb 11.7, otherwise normal  (all labs ordered are listed, but only abnormal results are displayed)  Labs Reviewed  HEMOGLOBIN A1C - Abnormal; Notable for the following components:      Result Value   Hgb A1c MFr Bld 6.8 (*)    All other components within normal limits  GLUCOSE, CAPILLARY - Abnormal; Notable for the following components:   Glucose-Capillary 106 (*)    All other components within normal limits     EKG 07/27/19: Sinus rhythm with occasional PVCs. Minimal voltage criteria for LVH, may be normal variant   CV:  Nuclear stress test 05/29/17:   Nuclear stress EF: 74%.  There was no ST segment deviation noted during stress.  The study is normal.  This is a low risk study.  The left ventricular ejection fraction is hyperdynamic (>65%).   Echo 05/29/17:  - Left ventricle: The cavity size was normal. Wall thickness was increased in a pattern of mild LVH. Systolic function was normal. The estimated ejection fraction was in the range of 60% to 65%. Wall motion was normal; there were no regional wall motion abnormalities. Doppler parameters are consistent with abnormal  left ventricular relaxation (grade 1 diastolic dysfunction).  - Aortic valve: There was no stenosis.  - Mitral valve: There was no significant regurgitation.  - Right ventricle: The cavity size was normal. Systolic function was normal.  - Tricuspid valve: Peak RV-RA gradient (S): 31 mm Hg.  - Pulmonary arteries: PA peak pressure: 34 mm Hg (S).  - Inferior vena cava: The vessel was normal in size. The respirophasic diameter changes were in the normal range (>= 50%), consistent with normal central venous pressure.  - Impressions:  Normal LV size with mild LV hypertrophy. EF 60-65%. Normal RV size and systolic function. No significant valvular abnormalities.    Past Medical History:  Diagnosis Date  . Anemia   . Anxiety   . Arthritis   . CAD (coronary artery disease)    Myoview 12/17: EF 66, apical and apical lateral defect - likely attenuation artifact, no ischemia; Low Risk // Myoview 3/19:  EF 74, no ischemia or scar; low risk   . Cataract, diabetic ([place])   . Chest pain   . Chronic kidney disease    stage 3  . Constipation   . Contusion of arm   . Diabetes mellitus   . Diabetic retinopathy   . DM (diabetes mellitus) ([place])   . Dyspnea    SOb on exertion  .  Esophageal stricture   . Family history of ovarian cancer   . Fatigue   . Glaucoma   . Heart burn   . Hiatal hernia   . History of echocardiogram    Echo 3/19:  Mild LVH, EF 60-65, no RWMA, Gr 1 DD, PASP 34  . HLD (hyperlipidemia)   . Hypertension   . Joint stiffness of hand   . Myocardial infarction ([place])    2004, stent  . Urinary incontinence   . Vitamin D deficiency     Past Surgical History:  Procedure Laterality Date  . adenosine cardillite  05/16/05   negative signif. myocardial ischemic  . BREAST LUMPECTOMY WITH RADIOACTIVE SEED LOCALIZATION Right 07/29/2019   Procedure: RIGHT BREAST LUMPECTOMY WITH RADIOACTIVE SEED LOCALIZATION;  Surgeon: [name], MD;  Location: [place];  Service:  General;  Laterality: Right;  . CARDIAC CATHETERIZATION  10/04   s/p stent LV 45%  . CATARACT EXTRACTION     bilateral  . CESAREAN SECTION  84, 86   ectopic pregnancy  . CHOLECYSTECTOMY  1998  . CORONARY ANGIOPLASTY WITH STENT PLACEMENT  10/04  . PARTIAL HYSTERECTOMY      MEDICATIONS: . acetaminophen (TYLENOL) 500 MG tablet  . albuterol (PROVENTIL HFA;VENTOLIN HFA) 108 (90 Base) MCG/ACT inhaler  . aspirin 81 MG EC tablet  . Cholecalciferol (D3 HIGH POTENCY) 50 MCG (2000 UT) CAPS  . ferrous sulfate 325 (65 FE) MG tablet  . gabapentin (NEURONTIN) 300 MG capsule  . glipiZIDE (GLUCOTROL) 5 MG tablet  . lisinopril-hydrochlorothiazide (PRINZIDE,ZESTORETIC) 20-12.5 MG tablet  . lovastatin (MEVACOR) 10 MG tablet  . MYRBETRIQ 25 MG TB24 tablet  . nitroGLYCERIN (NITROSTAT) 0.4 MG SL tablet  . oxyCODONE (OXY IR/ROXICODONE) 5 MG immediate release tablet  . TURMERIC PO   No current facility-administered medications for this encounter.    If no changes, I anticipate pt can proceed with surgery as scheduled.   Willeen Cass, FNP-BC [facility] Short Stay Surgical Center/Anesthesiology Phone: [phone] 08/26/2019 10:34 AM

## 2019-08-27 ENCOUNTER — Other Ambulatory Visit (HOSPITAL_COMMUNITY)
Admission: RE | Admit: 2019-08-27 | Discharge: 2019-08-27 | Disposition: A | Discharge status: Home / Self Care | Payer: Medicare HMO | Source: Ambulatory Visit | Attending: General Surgery | Admitting: General Surgery

## 2019-08-27 DIAGNOSIS — Z20822 Contact with and (suspected) exposure to covid-19: Secondary | ICD-10-CM | POA: Insufficient documentation

## 2019-08-27 DIAGNOSIS — Z01812 Encounter for preprocedural laboratory examination: Secondary | ICD-10-CM | POA: Insufficient documentation

## 2019-08-27 LAB — SARS CORONAVIRUS 2 (TAT 6-24 HRS): SARS Coronavirus 2: NEGATIVE

## 2019-08-30 NOTE — H&P (Signed)
  Evelyn Morrison Appointment: 08/19/2019 10:30 AM Location: [facility] Patient #: [id number] DOB: [birth year] Married / Language: English / Race: Black or African American Female   History of Present Illness [name] MD; 08/19/2019 11:13 AM) The patient is a 79 year old female who presents for a follow-up for Breast cancer. Pt is a lovely 79 yo F who was diagnosed with screening detected right breast cancer 05/2019. She had screening detected calcifications in the UOQ of the right breast. She had diagnostic imaging which showed a 6 mm area of calcifications. 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   She has had a maternal cousin with uterine cancer and a maternal aunt with ovarian cancer.   Pt had lumpectomy x 2 07/29/2019. Lateral margin is positive. She has some swelling, but no significant pain. She is doing well otherwise.   pathology 07/29/2019 FINAL MICROSCOPIC DIAGNOSIS:  A. BREAST, RIGHT SUPERFICIAL, LUMPECTOMY: - No residual carcinoma identified  B. BREAST, RIGHT, LUMPECTOMY: - Ductal carcinoma in situ, high grade, 1.2 cm - Margins involved by carcinoma (lateral, broadly) - Previous biopsy site changes - See oncology table and comment below below      Allergies Emeline Gins, CMA; 08/19/2019 10:33 AM) Penicillins  Hives. Palpitations Ibuprofen *ANALGESICS - ANTI-INFLAMMATORY*  Made the patient shake and reacts with some of her meds Lactose *PHARMACEUTICAL ADJUVANTS*  Stomach Pain Latex Exam Gloves *MEDICAL DEVICES AND SUPPLIES*  Rash. No "black" latex Vicks DayQuil/NyQuil Cough *COUGH/COLD/ALLERGY*  Made the patient shake Allergies Reconciled   Medication History Emeline Gins, CMA; 08/19/2019 10:34 AM) Aspirin (81MG  Tablet DR, Oral) Active. Multiple Vitamin (1 (one) Oral) Active. Gabapentin (300MG  Capsule, Oral) Active. glipiZIDE (5MG  Tablet, Oral) Active. Myrbetriq (25MG  Tablet ER  24HR, Oral) Active. Lovastatin (20MG  Tablet, Oral) Active. Nitroglycerin (0.4MG  Tab Sublingual, Sublingual) Active. Lisinopril-hydroCHLOROthiazide (20-12.5MG  Tablet, Oral) Active. Albuterol Sulfate HFA (108 (90 Base)MCG/ACT Aerosol Soln, Inhalation) Active. Cholecalciferol (50 MCG(2000 UT) Capsule, Oral) Active. Ferrous Sulfate (325 (65 Fe)MG Tablet, Oral) Active. Senokot (8.6MG  Tablet, Oral) Active. amLODIPine Besylate (10MG  Tablet, Oral) Active. Lovastatin (10MG  Tablet, Oral) Active. Medications Reconciled    Review of Systems [name] MD; 08/19/2019 11:13 AM) All other systems negative  Vitals Emeline Gins CMA; 08/19/2019 10:33 AM) 08/19/2019 10:33 AM Weight: 155.2 lb Height: 61in Body Surface Area: 1.7 m Body Mass Index: 29.32 kg/m  Temp.: 97.76F  Pulse: 111 (Regular)  BP: 136/82(Sitting, Left Arm, Standard)       Physical Exam [name] MD; 08/19/2019 11:13 AM) Breast Note: mild swelling/hematoma on right.     Assessment & Plan [name] MD; 08/19/2019 11:11 AM) MALIGNANT NEOPLASM OF UPPER-OUTER QUADRANT OF RIGHT BREAST IN FEMALE, ESTROGEN RECEPTOR NEGATIVE (C50.411) Impression: Pt has reexcision scheduled 6/2. Discussed.  Will need to follow up post op.  probably no radiation if we can get margin clear. Current Plans Instructed to keep follow-up appointment as scheduled   Signed electronically by [name], MD (08/19/2019 11:14 AM)

## 2019-08-31 ENCOUNTER — Encounter: Payer: Self-pay | Admitting: *Deleted

## 2019-08-31 ENCOUNTER — Other Ambulatory Visit: Payer: Self-pay

## 2019-08-31 ENCOUNTER — Encounter (HOSPITAL_COMMUNITY): Payer: Self-pay | Admitting: General Surgery

## 2019-08-31 ENCOUNTER — Ambulatory Visit (HOSPITAL_COMMUNITY): Payer: Medicare HMO | Admitting: Emergency Medicine

## 2019-08-31 ENCOUNTER — Ambulatory Visit (HOSPITAL_COMMUNITY)
Admission: RE | Admit: 2019-08-31 | Discharge: 2019-08-31 | Disposition: A | Discharge status: Home / Self Care | Payer: Medicare HMO | Attending: General Surgery | Admitting: General Surgery

## 2019-08-31 ENCOUNTER — Encounter (HOSPITAL_COMMUNITY)
Admission: RE | Disposition: A | Discharge status: Home / Self Care | Payer: Self-pay | Source: Home / Self Care | Attending: General Surgery

## 2019-08-31 ENCOUNTER — Ambulatory Visit (HOSPITAL_COMMUNITY): Payer: Medicare HMO | Admitting: Anesthesiology

## 2019-08-31 DIAGNOSIS — Z886 Allergy status to analgesic agent status: Secondary | ICD-10-CM | POA: Insufficient documentation

## 2019-08-31 DIAGNOSIS — K589 Irritable bowel syndrome without diarrhea: Secondary | ICD-10-CM | POA: Insufficient documentation

## 2019-08-31 DIAGNOSIS — K219 Gastro-esophageal reflux disease without esophagitis: Secondary | ICD-10-CM | POA: Diagnosis not present

## 2019-08-31 DIAGNOSIS — E739 Lactose intolerance, unspecified: Secondary | ICD-10-CM | POA: Diagnosis not present

## 2019-08-31 DIAGNOSIS — Z803 Family history of malignant neoplasm of breast: Secondary | ICD-10-CM | POA: Diagnosis not present

## 2019-08-31 DIAGNOSIS — Z91040 Latex allergy status: Secondary | ICD-10-CM | POA: Insufficient documentation

## 2019-08-31 DIAGNOSIS — Z888 Allergy status to other drugs, medicaments and biological substances status: Secondary | ICD-10-CM | POA: Diagnosis not present

## 2019-08-31 DIAGNOSIS — Z171 Estrogen receptor negative status [ER-]: Secondary | ICD-10-CM | POA: Insufficient documentation

## 2019-08-31 DIAGNOSIS — Z8049 Family history of malignant neoplasm of other genital organs: Secondary | ICD-10-CM | POA: Insufficient documentation

## 2019-08-31 DIAGNOSIS — K449 Diaphragmatic hernia without obstruction or gangrene: Secondary | ICD-10-CM | POA: Insufficient documentation

## 2019-08-31 DIAGNOSIS — Z955 Presence of coronary angioplasty implant and graft: Secondary | ICD-10-CM | POA: Diagnosis not present

## 2019-08-31 DIAGNOSIS — Z8041 Family history of malignant neoplasm of ovary: Secondary | ICD-10-CM | POA: Diagnosis not present

## 2019-08-31 DIAGNOSIS — I2510 Atherosclerotic heart disease of native coronary artery without angina pectoris: Secondary | ICD-10-CM | POA: Insufficient documentation

## 2019-08-31 DIAGNOSIS — I252 Old myocardial infarction: Secondary | ICD-10-CM | POA: Diagnosis not present

## 2019-08-31 DIAGNOSIS — E1136 Type 2 diabetes mellitus with diabetic cataract: Secondary | ICD-10-CM | POA: Diagnosis not present

## 2019-08-31 DIAGNOSIS — Z880 Allergy status to penicillin: Secondary | ICD-10-CM | POA: Diagnosis not present

## 2019-08-31 DIAGNOSIS — F419 Anxiety disorder, unspecified: Secondary | ICD-10-CM | POA: Diagnosis not present

## 2019-08-31 DIAGNOSIS — D0511 Intraductal carcinoma in situ of right breast: Secondary | ICD-10-CM | POA: Diagnosis not present

## 2019-08-31 DIAGNOSIS — M1990 Unspecified osteoarthritis, unspecified site: Secondary | ICD-10-CM | POA: Insufficient documentation

## 2019-08-31 DIAGNOSIS — Z7984 Long term (current) use of oral hypoglycemic drugs: Secondary | ICD-10-CM | POA: Insufficient documentation

## 2019-08-31 DIAGNOSIS — E669 Obesity, unspecified: Secondary | ICD-10-CM | POA: Insufficient documentation

## 2019-08-31 DIAGNOSIS — C50911 Malignant neoplasm of unspecified site of right female breast: Secondary | ICD-10-CM | POA: Diagnosis not present

## 2019-08-31 DIAGNOSIS — E119 Type 2 diabetes mellitus without complications: Secondary | ICD-10-CM | POA: Insufficient documentation

## 2019-08-31 DIAGNOSIS — Z6830 Body mass index (BMI) 30.0-30.9, adult: Secondary | ICD-10-CM | POA: Insufficient documentation

## 2019-08-31 DIAGNOSIS — Z87891 Personal history of nicotine dependence: Secondary | ICD-10-CM | POA: Insufficient documentation

## 2019-08-31 DIAGNOSIS — I10 Essential (primary) hypertension: Secondary | ICD-10-CM | POA: Insufficient documentation

## 2019-08-31 HISTORY — PX: RE-EXCISION OF BREAST LUMPECTOMY: SHX6048

## 2019-08-31 LAB — GLUCOSE, CAPILLARY
Glucose-Capillary: 113 mg/dL — ABNORMAL HIGH (ref 70–99)
Glucose-Capillary: 133 mg/dL — ABNORMAL HIGH (ref 70–99)

## 2019-08-31 SURGERY — EXCISION, LESION, BREAST
Anesthesia: General | Site: Breast | Laterality: Right

## 2019-08-31 MED ORDER — EPHEDRINE SULFATE-NACL 50-0.9 MG/10ML-% IV SOSY
PREFILLED_SYRINGE | INTRAVENOUS | Status: DC | PRN
  Administered 2019-08-31: 15 mg via INTRAVENOUS
  Administered 2019-08-31: 10 mg via INTRAVENOUS
  Administered 2019-08-31: 5 mg via INTRAVENOUS

## 2019-08-31 MED ORDER — ONDANSETRON HCL 4 MG/2ML IJ SOLN
INTRAMUSCULAR | Status: DC | PRN
  Administered 2019-08-31: 4 mg via INTRAVENOUS

## 2019-08-31 MED ORDER — ENSURE PRE-SURGERY PO LIQD: 296.0000 mL | Freq: Once | ORAL | Status: DC

## 2019-08-31 MED ORDER — CHLORHEXIDINE GLUCONATE CLOTH 2 % EX PADS: 6.0000 | MEDICATED_PAD | Freq: Once | CUTANEOUS | Status: DC

## 2019-08-31 MED ORDER — ONDANSETRON HCL 4 MG/2ML IJ SOLN: 4.0000 mg | Freq: Once | INTRAMUSCULAR | Status: DC | PRN

## 2019-08-31 MED ORDER — PHENYLEPHRINE HCL (PRESSORS) 10 MG/ML IV SOLN
INTRAVENOUS | Status: DC | PRN
Start: 2019-08-31 — End: 2019-08-31
  Administered 2019-08-31: 80 ug via INTRAVENOUS
  Administered 2019-08-31: 160 ug via INTRAVENOUS
  Administered 2019-08-31: 240 ug via INTRAVENOUS
  Administered 2019-08-31: 160 ug via INTRAVENOUS

## 2019-08-31 MED ORDER — LACTATED RINGERS IV SOLN: INTRAVENOUS | Status: DC

## 2019-08-31 MED ORDER — PROPOFOL 10 MG/ML IV BOLUS
INTRAVENOUS | Status: DC | PRN
  Administered 2019-08-31: 150 mg via INTRAVENOUS

## 2019-08-31 MED ORDER — LIDOCAINE HCL (PF) 1 % IJ SOLN
INTRAMUSCULAR | Status: AC
  Filled 2019-08-31: qty 30

## 2019-08-31 MED ORDER — OXYCODONE HCL 5 MG PO TABS: 5.0000 mg | ORAL_TABLET | Freq: Once | ORAL | Status: DC | PRN

## 2019-08-31 MED ORDER — CIPROFLOXACIN IN D5W 400 MG/200ML IV SOLN
400.0000 mg | INTRAVENOUS | Status: AC
  Administered 2019-08-31: 400 mg via INTRAVENOUS
  Filled 2019-08-31: qty 200

## 2019-08-31 MED ORDER — PROPOFOL 10 MG/ML IV BOLUS
INTRAVENOUS | Status: AC
  Filled 2019-08-31: qty 40

## 2019-08-31 MED ORDER — FENTANYL CITRATE (PF) 250 MCG/5ML IJ SOLN
INTRAMUSCULAR | Status: AC
  Filled 2019-08-31: qty 5

## 2019-08-31 MED ORDER — FENTANYL CITRATE (PF) 100 MCG/2ML IJ SOLN
INTRAMUSCULAR | Status: DC | PRN
  Administered 2019-08-31: 25 ug via INTRAVENOUS
  Administered 2019-08-31: 100 ug via INTRAVENOUS
  Administered 2019-08-31: 25 ug via INTRAVENOUS

## 2019-08-31 MED ORDER — LIDOCAINE 2% (20 MG/ML) 5 ML SYRINGE
INTRAMUSCULAR | Status: DC | PRN
  Administered 2019-08-31: 60 mg via INTRAVENOUS

## 2019-08-31 MED ORDER — DEXAMETHASONE SODIUM PHOSPHATE 10 MG/ML IJ SOLN
INTRAMUSCULAR | Status: DC | PRN
Start: 2019-08-31 — End: 2019-08-31
  Administered 2019-08-31: 4 mg via INTRAVENOUS

## 2019-08-31 MED ORDER — CHLORHEXIDINE GLUCONATE 0.12 % MT SOLN: 15.0000 mL | Freq: Once | OROMUCOSAL | Status: AC

## 2019-08-31 MED ORDER — OXYCODONE HCL 5 MG PO TABS
2.5000 mg | ORAL_TABLET | Freq: Four times a day (QID) | ORAL | 0 refills | Status: DC | PRN
Start: 2019-08-31 — End: 2020-01-20

## 2019-08-31 MED ORDER — CHLORHEXIDINE GLUCONATE 0.12 % MT SOLN
OROMUCOSAL | Status: AC
  Administered 2019-08-31: 15 mL via OROMUCOSAL
  Filled 2019-08-31: qty 15

## 2019-08-31 MED ORDER — ACETAMINOPHEN 500 MG PO TABS
1000.0000 mg | ORAL_TABLET | ORAL | Status: AC
  Administered 2019-08-31: 1000 mg via ORAL
  Filled 2019-08-31: qty 2

## 2019-08-31 MED ORDER — MIDAZOLAM HCL 2 MG/2ML IJ SOLN
INTRAMUSCULAR | Status: AC
  Filled 2019-08-31: qty 2

## 2019-08-31 MED ORDER — FENTANYL CITRATE (PF) 100 MCG/2ML IJ SOLN: 25.0000 ug | INTRAMUSCULAR | Status: DC | PRN

## 2019-08-31 MED ORDER — OXYCODONE HCL 5 MG/5ML PO SOLN: 5.0000 mg | Freq: Once | ORAL | Status: DC | PRN

## 2019-08-31 MED ORDER — LIDOCAINE HCL 1 % IJ SOLN
INTRAMUSCULAR | Status: DC | PRN
  Administered 2019-08-31: 40 mL

## 2019-08-31 MED ORDER — 0.9 % SODIUM CHLORIDE (POUR BTL) OPTIME
TOPICAL | Status: DC | PRN
  Administered 2019-08-31: 60 mL

## 2019-08-31 MED ORDER — ORAL CARE MOUTH RINSE: 15.0000 mL | Freq: Once | OROMUCOSAL | Status: AC

## 2019-08-31 SURGICAL SUPPLY — 45 items
ADH SKN CLS APL DERMABOND .7 (GAUZE/BANDAGES/DRESSINGS) ×1
APL PRP STRL LF DISP 70% ISPRP (MISCELLANEOUS) ×1
BINDER BREAST LRG (GAUZE/BANDAGES/DRESSINGS) ×2 IMPLANT
BINDER BREAST XLRG (GAUZE/BANDAGES/DRESSINGS) IMPLANT
CANISTER SUCT 3000ML PPV (MISCELLANEOUS) ×3 IMPLANT
CHLORAPREP W/TINT 26 (MISCELLANEOUS) ×3 IMPLANT
CLIP VESOCCLUDE LG 6/CT (CLIP) IMPLANT
CLOSURE WOUND 1/2 X4 (GAUZE/BANDAGES/DRESSINGS) ×1
CNTNR URN SCR LID CUP LEK RST (MISCELLANEOUS) ×1 IMPLANT
CONT SPEC 4OZ STRL OR WHT (MISCELLANEOUS) ×3
COVER SURGICAL LIGHT HANDLE (MISCELLANEOUS) ×3 IMPLANT
COVER WAND RF STERILE (DRAPES) ×3 IMPLANT
DERMABOND ADVANCED (GAUZE/BANDAGES/DRESSINGS) ×2
DERMABOND ADVANCED .7 DNX12 (GAUZE/BANDAGES/DRESSINGS) ×1 IMPLANT
DRAPE CHEST BREAST 15X10 FENES (DRAPES) ×3 IMPLANT
DRSG PAD ABDOMINAL 8X10 ST (GAUZE/BANDAGES/DRESSINGS) ×3 IMPLANT
ELECT REM PT RETURN 9FT ADLT (ELECTROSURGICAL) ×3
ELECTRODE REM PT RTRN 9FT ADLT (ELECTROSURGICAL) ×1 IMPLANT
GAUZE SPONGE 4X4 12PLY STRL (GAUZE/BANDAGES/DRESSINGS) ×3 IMPLANT
GLOVE BIO SURGEON STRL SZ 6 (GLOVE) ×3 IMPLANT
GLOVE INDICATOR 6.5 STRL GRN (GLOVE) ×3 IMPLANT
GOWN STRL REUS W/ TWL LRG LVL3 (GOWN DISPOSABLE) ×1 IMPLANT
GOWN STRL REUS W/TWL 2XL LVL3 (GOWN DISPOSABLE) ×3 IMPLANT
GOWN STRL REUS W/TWL LRG LVL3 (GOWN DISPOSABLE) ×3
ILLUMINATOR WAVEGUIDE N/F (MISCELLANEOUS) IMPLANT
KIT BASIN OR (CUSTOM PROCEDURE TRAY) ×3 IMPLANT
KIT MARKER MARGIN INK (KITS) IMPLANT
KIT TURNOVER KIT B (KITS) ×3 IMPLANT
LIGHT WAVEGUIDE WIDE FLAT (MISCELLANEOUS) IMPLANT
NDL HYPO 25GX1X1/2 BEV (NEEDLE) ×1 IMPLANT
NEEDLE HYPO 25GX1X1/2 BEV (NEEDLE) ×3 IMPLANT
NS IRRIG 1000ML POUR BTL (IV SOLUTION) ×3 IMPLANT
PACK GENERAL/GYN (CUSTOM PROCEDURE TRAY) ×3 IMPLANT
PAD ARMBOARD 7.5X6 YLW CONV (MISCELLANEOUS) ×3 IMPLANT
PENCIL SMOKE EVACUATOR (MISCELLANEOUS) ×3 IMPLANT
STRIP CLOSURE SKIN 1/2X4 (GAUZE/BANDAGES/DRESSINGS) ×2 IMPLANT
SUT MNCRL AB 4-0 PS2 18 (SUTURE) ×3 IMPLANT
SUT SILK 2 0 PERMA HAND 18 BK (SUTURE) IMPLANT
SUT VIC AB 2-0 SH 27 (SUTURE) ×3
SUT VIC AB 2-0 SH 27X BRD (SUTURE) IMPLANT
SUT VIC AB 3-0 SH 27 (SUTURE) ×3
SUT VIC AB 3-0 SH 27X BRD (SUTURE) ×1 IMPLANT
SYR CONTROL 10ML LL (SYRINGE) ×3 IMPLANT
TOWEL GREEN STERILE (TOWEL DISPOSABLE) ×3 IMPLANT
TOWEL GREEN STERILE FF (TOWEL DISPOSABLE) ×3 IMPLANT

## 2019-08-31 NOTE — Anesthesia Postprocedure Evaluation (Signed)
Anesthesia Post Note  Patient: Evelyn Morrison  Procedure(s) Performed: RE-EXCISION OF RIGHT BREAST LUMPECTOMY (Right Breast)     Patient location during evaluation: PACU Anesthesia Type: General Level of consciousness: awake and alert Pain management: pain level controlled Vital Signs Assessment: post-procedure vital signs reviewed and stable Respiratory status: spontaneous breathing, nonlabored ventilation and respiratory function stable Cardiovascular status: blood pressure returned to baseline and stable Postop Assessment: no apparent nausea or vomiting Anesthetic complications: no    Last Vitals:  Vitals:   08/31/19 1043 08/31/19 1044  BP: 126/87   Pulse:  90  Resp: 20 19  Temp:    SpO2:  100%    Last Pain:  Vitals:   08/31/19 0814  PainSc: 0-No pain                 Audry Pili

## 2019-08-31 NOTE — Transfer of Care (Signed)
Immediate Anesthesia Transfer of Care Note  Patient: Evelyn Morrison  Procedure(s) Performed: RE-EXCISION OF RIGHT BREAST LUMPECTOMY (Right Breast)  Patient Location: PACU  Anesthesia Type:General  Level of Consciousness: awake, alert  and oriented  Airway & Oxygen Therapy: Patient Spontanous Breathing and Patient connected to nasal cannula oxygen  Post-op Assessment: Report given to RN, Post -op Vital signs reviewed and stable and Patient moving all extremities  Post vital signs: Reviewed and stable  Last Vitals:  Vitals Value Taken Time  BP 126/87 08/31/19 1043  Temp 36.3 C 08/31/19 1013  Pulse 91 08/31/19 1045  Resp 24 08/31/19 1045  SpO2 100 % 08/31/19 1045  Vitals shown include unvalidated device data.  Last Pain:  Vitals:   08/31/19 0814  PainSc: 0-No pain      Patients Stated Pain Goal: 3 (29/47/65 4650)  Complications: No apparent anesthesia complications

## 2019-08-31 NOTE — Discharge Instructions (Addendum)
[facility] °Office Phone Number [phone] ° °BREAST BIOPSY/ PARTIAL MASTECTOMY: POST OP INSTRUCTIONS ° °Always review your discharge instruction sheet given to you by the facility where your surgery was performed. ° °IF YOU HAVE DISABILITY OR FAMILY LEAVE FORMS, YOU MUST BRING THEM TO THE OFFICE FOR PROCESSING.  DO NOT GIVE THEM TO YOUR DOCTOR. ° °1. A prescription for pain medication may be given to you upon discharge.  Take your pain medication as prescribed, if needed.  If narcotic pain medicine is not needed, then you may take acetaminophen (Tylenol) or ibuprofen (Advil) as needed. °2. Take your usually prescribed medications unless otherwise directed °3. If you need a refill on your pain medication, please contact your pharmacy.  They will contact our office to request authorization.  Prescriptions will not be filled after 5pm or on week-ends. °4. You should eat very light the first 24 hours after surgery, such as soup, crackers, pudding, etc.  Resume your normal diet the day after surgery. °5. Most patients will experience some swelling and bruising in the breast.  Ice packs and a good support bra will help.  Swelling and bruising can take several days to resolve.  °6. It is common to experience some constipation if taking pain medication after surgery.  Increasing fluid intake and taking a stool softener will usually help or prevent this problem from occurring.  A mild laxative (Milk of Magnesia or Miralax) should be taken according to package directions if there are no bowel movements after 48 hours. °7. Unless discharge instructions indicate otherwise, you may remove your bandages 48 hours after surgery, and you may shower at that time.  You may have steri-strips (small skin tapes) in place directly over the incision.  These strips should be left on the skin for 7-10 days.   Any sutures or staples will be removed at the office during your follow-up visit. °8. ACTIVITIES:  You may resume  regular daily activities (gradually increasing) beginning the next day.  Wearing a good support bra or sports bra (or the breast binder) minimizes pain and swelling.  You may have sexual intercourse when it is comfortable. °a. You may drive when you no longer are taking prescription pain medication, you can comfortably wear a seatbelt, and you can safely maneuver your car and apply brakes. °b. RETURN TO WORK:  __________1 week_______________ °9. You should see your doctor in the office for a follow-up appointment approximately two weeks after your surgery.  Your doctor’s nurse will typically make your follow-up appointment when she calls you with your pathology report.  Expect your pathology report 2-3 business days after your surgery.  You may call to check if you do not hear from us after three days. ° ° °WHEN TO CALL YOUR DOCTOR: °1. Fever over 101.0 °2. Nausea and/or vomiting. °3. Extreme swelling or bruising. °4. Continued bleeding from incision. °5. Increased pain, redness, or drainage from the incision. ° °The clinic staff is available to answer your questions during regular business hours.  Please don’t hesitate to call and ask to speak to one of the nurses for clinical concerns.  If you have a medical emergency, go to the nearest emergency room or call 911.  A surgeon from [facility] is always on call at the hospital. ° °For further questions, please visit [email or web address]  ° °

## 2019-08-31 NOTE — Anesthesia Procedure Notes (Signed)
Procedure Name: LMA Insertion Date/Time: 08/31/2019 9:08 AM Performed by: Leonor Liv, CRNA Pre-anesthesia Checklist: Patient identified, Emergency Drugs available, Suction available and Patient being monitored Patient Re-evaluated:Patient Re-evaluated prior to induction Oxygen Delivery Method: Circle System Utilized Preoxygenation: Pre-oxygenation with 100% oxygen Induction Type: IV induction Ventilation: Mask ventilation without difficulty LMA: LMA inserted LMA Size: 4.0 Number of attempts: 1 Placement Confirmation: positive ETCO2 Tube secured with: Tape Dental Injury: Teeth and Oropharynx as per pre-operative assessment

## 2019-08-31 NOTE — Interval H&P Note (Signed)
History and Physical Interval Note:  08/31/2019 8:45 AM  Evelyn Morrison  has presented today for surgery, with the diagnosis of right breast cancer.  The various methods of treatment have been discussed with the patient and family. After consideration of risks, benefits and other options for treatment, the patient has consented to  Procedure(s): RE-EXCISION OF RIGHT BREAST LUMPECTOMY (Right) as a surgical intervention.  The patient's history has been reviewed, patient examined, no change in status, stable for surgery.  I have reviewed the patient's chart and labs.  Questions were answered to the patient's satisfaction.     [name]

## 2019-08-31 NOTE — Op Note (Signed)
Re-excisional right Breast Lumpectomy   Indications: This patient presents with history of positive margins after partial mastectomy for right breast cancer, cTis  Pre-operative Diagnosis: right breast cancer   Post-operative Diagnosis: right breast cancer   Surgeon: [name]   Assistants: Carlena Hurl, PA-C  Anesthesia: General anesthesia and Local anesthesia   Procedure Details  The patient was seen in the Holding Room. The risks, benefits, complications, treatment options, and expected outcomes were discussed with the patient. The possibilities of reaction to medication, pulmonary aspiration, bleeding, infection, the need for additional procedures, failure to diagnose a condition, and creating a complication requiring transfusion or operation were discussed with the patient. The patient concurred with the proposed plan, giving informed consent. The site of surgery properly noted/marked. The patient was taken to Operating Room # 6, identified, and the procedure verified as re-excision of right breast cancer.  After induction of anesthesia, the right breast and chest were prepped and draped in standard fashion.  The lumpectomy was performed by reopening the prior incision. Seroma was aspirated. The mastopexy sutures were removed. Additional margins were taken at the lateral border of the partial mastectomy cavity.  Orientation sutures were placed in the specimens. Hemostasis was achieved with cautery. The wound was irrigated and closed with a 3-0 Vicryl deep dermal interrupted and a 4-0 Monocryl subcuticular closure in layers.  Sterile dressings were applied. At the end of the operation, all sponge, instrument, and needle counts were correct.   Findings:  grossly clear surgical margins, lateral margin now skin.    Estimated Blood Loss: Minimal   Drains: none   Specimens: additional lateral margins.   Complications: None; patient tolerated the procedure well.   Disposition: PACU -  hemodynamically stable.   Condition: stable

## 2019-09-01 LAB — SURGICAL PATHOLOGY

## 2019-09-05 ENCOUNTER — Telehealth: Payer: Self-pay | Admitting: *Deleted

## 2019-09-05 ENCOUNTER — Telehealth: Payer: Self-pay | Admitting: Emergency Medicine

## 2019-09-05 ENCOUNTER — Encounter: Payer: Self-pay | Admitting: *Deleted

## 2019-09-05 NOTE — Telephone Encounter (Signed)
Received call from pt's daughter requesting f/u on recent procedure and what the plan of care is from here on out.  Advised her that Nurse [place] would be contacted regarding her questions and would f/u with her.  Info left with Dawn's office phone.

## 2019-09-05 NOTE — Telephone Encounter (Signed)
Spoke to pt daughter Arma Heading regarding final pathology from re-excision and next steps. Discussed margins are clear per pathology report, no residual carcinoma. Discussed next step will be to discuss xrt with Dr. Sondra Come. Gave Desiray appt time for 09/28/19 at 3:30 per time slot held per rad onc scheduler. Informed scheduler pt will come to appt. No further needs voiced at this time.

## 2019-09-08 ENCOUNTER — Encounter: Payer: Self-pay | Admitting: *Deleted

## 2019-09-22 NOTE — Progress Notes (Signed)
Patient here for a new consult with Dr. Sondra Come.  08/31/2019  Evelyn Klein, MD  Physician  Surgery  Op Note     Signed  Date of Service:  08/31/2019 9:01 AM          Signed       Re-excisional right Breast Lumpectomy   Indications: This patient presents with history of positive margins after partial mastectomy for right breast cancer, cTis  Pre-operative Diagnosis: right breast cancer   Post-operative Diagnosis: right breast cancer   Surgeon: Evelyn Morrison   Assistants: Evelyn Hurl, PA-C  Anesthesia: General anesthesia and Local anesthesia   Procedure Details  The patient was seen in the Holding Room. The risks, benefits, complications, treatment options, and expected outcomes were discussed with the patient. The possibilities of reaction to medication, pulmonary aspiration, bleeding, infection, the need for additional procedures, failure to diagnose a condition, and creating a complication requiring transfusion or operation were discussed with the patient. The patient concurred with the proposed plan, giving informed consent. The site of surgery properly noted/marked. The patient was taken to Operating Room # 6, identified, and the procedure verified as re-excision of right breast cancer.  After induction of anesthesia, the right breast and chest were prepped and draped in standard fashion.  The lumpectomy was performed by reopening the prior incision. Seroma was aspirated. The mastopexy sutures were removed. Additional margins were taken at the lateral border of the partial mastectomy cavity.  Orientation sutures were placed in the specimens. Hemostasis was achieved with cautery. The wound was irrigated and closed with a 3-0 Vicryl deep dermal interrupted and a 4-0 Monocryl subcuticular closure in layers.  Sterile dressings were applied. At the end of the operation, all sponge, instrument, and needle counts were correct.   Findings:  grossly clear surgical margins,  lateral margin now skin.    Estimated Blood Loss: Minimal   Drains: none   Specimens: additional lateral margins.   Complications: None; patient tolerated the procedure well.   Disposition: PACU - hemodynamically stable.   Condition: stable          Electronically signed by Evelyn Morrison,   Evelyn Morrison  Location: [facility] Patient #: [id number] DOB: 03-15-1941 Undefined / Language: Evelyn Morrison / Race: Black or African American Female   History of Present Illness The patient is a 79 year old female who presents with breast cancer. Pt is a lovely 79 yo F who was diagnosed with screening detected right breast cancer 05/2019. She had screening detected calcifications in the UOQ of the right breast. She had diagnostic imaging which showed a 6 mm area of calcifications. 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   She has had a maternal cousin with uterine cancer and a maternal aunt with ovarian cancer.   dx mammogram 05/30/2019 SOLIS density B developing round calcifications in the right UOQ around 5 mm in size.    pathology 06/08/2019 Breast, right, needle core biopsy, calcifications - DUCTAL CARCINOMA IN SITU. The DCIS is of intermediate to high nuclear grade with central necrosis and calcifications. Estrogen Receptor: 0%, NEGATIVE Progesterone Receptor: 0%, NEGATIVE  CBC normal CMET Cr 1.48 (normal between 1.4 and 2)   Past Surgical History Cesarean Section - 1  Gallbladder Surgery - Laparoscopic  Hysterectomy (not due to cancer) - Partial  Valve Replacement   Diagnostic Studies History  Colonoscopy  >10 years ago Mammogram  within last year Pap  Smear  >5 years ago  Medication History  Medications Reconciled  Social History  Caffeine use  Coffee. No alcohol use  No drug use  Tobacco use  Former smoker.  Family History Alcohol Abuse   Father. Heart Disease  Father, Mother. Ovarian Cancer  Family Members In General.  Pregnancy / Birth History Age at menarche  11 years. Age of menopause  51-55 Contraceptive History  Intrauterine device, Oral contraceptives. Gravida  5 Irregular periods  Length (months) of breastfeeding  3-6 Maternal age  66-30 Para  5  Other Problems  Anxiety Disorder  Arthritis  Bladder Problems  Diabetes Mellitus  High blood pressure  Myocardial infarction     Review of Systems General Present- Appetite Loss. Not Present- Chills, Fatigue, Fever, Night Sweats, Weight Gain and Weight Loss. Skin Not Present- Change in Wart/Mole, Dryness, Hives, Jaundice, New Lesions, Non-Healing Wounds, Rash and Ulcer. HEENT Present- Sore Throat and Wears glasses/contact lenses. Not Present- Earache, Hearing Loss, Hoarseness, Nose Bleed, Oral Ulcers, Ringing in the Ears, Seasonal Allergies, Sinus Pain, Visual Disturbances and Yellow Eyes. Respiratory Not Present- Bloody sputum, Chronic Cough, Difficulty Breathing, Snoring and Wheezing. Breast Present- Breast Pain. Not Present- Breast Mass, Nipple Discharge and Skin Changes. Cardiovascular Present- Chest Pain and Shortness of Breath. Not Present- Difficulty Breathing Lying Down, Leg Cramps, Palpitations, Rapid Heart Rate and Swelling of Extremities. Gastrointestinal Present- Constipation. Not Present- Abdominal Pain, Bloating, Bloody Stool, Change in Bowel Habits, Chronic diarrhea, Difficulty Swallowing, Excessive gas, Gets full quickly at meals, Hemorrhoids, Indigestion, Nausea, Rectal Pain and Vomiting. Female Genitourinary Present- Urgency. Not Present- Frequency, Nocturia, Painful Urination and Pelvic Pain. Musculoskeletal Present- Joint Pain. Not Present- Back Pain, Joint Stiffness, Muscle Pain, Muscle Weakness and Swelling of Extremities. Neurological Present- Numbness. Not Present- Decreased Memory, Fainting, Headaches, Seizures, Tingling,  Tremor, Trouble walking and Weakness. Psychiatric Present- Anxiety. Not Present- Bipolar, Change in Sleep Pattern, Depression, Fearful and Frequent crying. Endocrine Present- New Diabetes. Not Present- Cold Intolerance, Excessive Hunger, Hair Changes, Heat Intolerance and Hot flashes. Hematology Present- Blood Thinners. Not Present- Easy Bruising, Excessive bleeding, Gland problems, HIV and Persistent Infections.  Vitals  Weight: 154.9 lb Height: 61in Body Surface Area: 1.69 m Body Mass Index: 29.27 kg/m  Temp.: 98.41F  Pulse: 88 (Regular)  Resp.: 18 (Unlabored)  BP: 115/79(Sitting, Left Arm, Standard)       Physical Exam General Mental Status-Alert. General Appearance-Consistent with stated age. Hydration-Well hydrated. Voice-Normal.  Head and Neck Head-normocephalic, atraumatic with no lesions or palpable masses. Trachea-midline. Thyroid Gland Characteristics - normal size and consistency.  Eye Eyeball - Bilateral-Extraocular movements intact. Sclera/Conjunctiva - Bilateral-No scleral icterus.  Chest and Lung Exam Chest and lung exam reveals -quiet, even and easy respiratory effort with no use of accessory muscles and on auscultation, normal breath sounds, no adventitious sounds and normal vocal resonance. Inspection Chest Wall - Normal. Back - normal.  Breast Note: ptotic bilaterally. no palpable masses. faint bruising UOQ right breast. no nipple retraction or nipple discharge. no LAD.   Cardiovascular Cardiovascular examination reveals -normal heart sounds, regular rate and rhythm with no murmurs and normal pedal pulses bilaterally.  Abdomen Inspection Inspection of the abdomen reveals - No Hernias. Palpation/Percussion Palpation and Percussion of the abdomen reveal - Soft, Non Tender, No Rebound tenderness, No Rigidity (guarding) and No hepatosplenomegaly. Auscultation Auscultation of the abdomen reveals -  Bowel sounds normal.  Neurologic Neurologic evaluation reveals -alert and oriented x 3 with no impairment of recent or remote memory. Mental Status-Normal.  Musculoskeletal Global  Assessment -Note: no gross deformities.  Normal Exam - Left-Upper Extremity Strength Normal and Lower Extremity Strength Normal. Normal Exam - Right-Upper Extremity Strength Normal and Lower Extremity Strength Normal.  Lymphatic Head & Neck  General Head & Neck Lymphatics: Bilateral - Description - Normal. Axillary  General Axillary Region: Bilateral - Description - Normal. Tenderness - Non Tender. Femoral & Inguinal  Generalized Femoral & Inguinal Lymphatics: Bilateral - Description - No Generalized lymphadenopathy.    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  She is a good candidate for lumpectomy. Will plan seed localized lumpectomy followed by radiation given the hormone negative and higher grade.  The surgical procedure was described to the patient. I discussed the incision type and location and that we would need radiology involved on with a wire or seed marker and/or sentinel node.  The risks and benefits of the procedure were described to the patient and she wishes to proceed.  We discussed the risks bleeding, infection, damage to other structures, need for further procedures/surgeries. We discussed the risk of seroma. The patient was advised if the area in the breast in cancer, we may need to go back to surgery for additional tissue to obtain negative margins or for a lymph node biopsy. The patient was advised that these are the most common complications, but that others can occur as well. They were advised against taking aspirin or other anti-inflammatory agents/blood thinners the week before surgery. Current  Plans You are being scheduled for surgery- Our schedulers will call you.  You should hear from our office's scheduling department within 5 working days about the location, date, and time of surgery. We try to make accommodations for patient's preferences in scheduling surgery, but sometimes the OR schedule or the surgeon's schedule prevents [place] from making those accommodations.  If you have not heard from our office [phone]) in 5 working days, call the office and ask for your surgeon's nurse.  If you have other questions about your diagnosis, plan, or surgery, call the office and ask for your surgeon's nurse.  CORONARY ARTERY DISEASE (I25.10) Impression: I have contacted Dr. Burt Knack and no additional cardiac testing was needed.  FAMILY HISTORY OF CANCER (Z80.9) Negative, but VUS in APC and NTHL1            Electronically signed by Evelyn Morrison,    Past/Anticipated interventions by surgeon, if [facility]:NOBS  Past/Anticipated interventions by medical oncology, if any: none  Lymphedema issues, if any:  none  Pain issues, if any: Pelvic pain daily for 2 weeks  SAFETY ISSUES:  Prior radiation? no  Pacemaker/ICD? no  Possible current pregnancy? postmenopausal  Is the patient on methotrexate? no  Current Complaints / other details:  Pt. In the midst of moving and is stressed about this.  BP 121/83 (BP Location: Left Arm, Patient Position: Sitting, Cuff Size: Normal)   Pulse 91   Temp 97.7 F (36.5 C)   Resp 20   Ht 5' (1.524 m)   Wt 155 lb (70.3 kg)   SpO2 100%   BMI 30.27 kg/m   Wt Readings from Last 3 Encounters:  09/28/19 155 lb (70.3 kg)  08/31/19 157 lb 6.5 oz (71.4 kg)  08/25/19 157 lb 6.5 oz (71.4 kg)      De Burrs, RN 09/22/2019,1:52 PM

## 2019-09-28 ENCOUNTER — Encounter: Payer: Self-pay | Admitting: Radiation Oncology

## 2019-09-28 ENCOUNTER — Ambulatory Visit
Admission: RE | Admit: 2019-09-28 | Discharge: 2019-09-28 | Disposition: A | Discharge status: Home / Self Care | Payer: Medicare HMO | Source: Ambulatory Visit | Attending: Radiation Oncology | Admitting: Radiation Oncology

## 2019-09-28 ENCOUNTER — Ambulatory Visit: Payer: Medicare HMO

## 2019-09-28 ENCOUNTER — Other Ambulatory Visit: Payer: Self-pay

## 2019-09-28 ENCOUNTER — Encounter: Payer: Self-pay | Admitting: Licensed Clinical Social Worker

## 2019-09-28 DIAGNOSIS — D0511 Intraductal carcinoma in situ of right breast: Secondary | ICD-10-CM | POA: Insufficient documentation

## 2019-09-28 DIAGNOSIS — Z79899 Other long term (current) drug therapy: Secondary | ICD-10-CM | POA: Diagnosis not present

## 2019-09-28 DIAGNOSIS — Z171 Estrogen receptor negative status [ER-]: Secondary | ICD-10-CM | POA: Diagnosis not present

## 2019-09-28 DIAGNOSIS — Z7984 Long term (current) use of oral hypoglycemic drugs: Secondary | ICD-10-CM | POA: Diagnosis not present

## 2019-09-28 DIAGNOSIS — Z7982 Long term (current) use of aspirin: Secondary | ICD-10-CM | POA: Insufficient documentation

## 2019-09-28 DIAGNOSIS — Z9889 Other specified postprocedural states: Secondary | ICD-10-CM | POA: Diagnosis not present

## 2019-09-28 NOTE — Progress Notes (Signed)
[place] Psychosocial Distress Screening Clinical Social Work  Clinical Social Work was referred by distress screening protocol.  The patient scored a 7 on the Psychosocial Distress Thermometer which indicates moderate distress. Patient declined call from Education officer, museum. Has previously been given contact information for social work as well as information on support services.   [facility] DISTRESS SCREENING 09/28/2019  Screening Type Initial Screening  Distress experienced in past week (1-10) 7  Practical problem type   Emotional problem type   Spiritual/Religous concerns type   Information Concerns Type   Physical Problem type   Referral to clinical social work No  Other Declines to be called by social worker   [place] 06/22/2019  Screening Type Initial Screening  Distress experienced in past week (1-10) 10  Practical problem type Housing  Emotional problem type Adjusting to illness;Feeling hopeless  Spiritual/Religous concerns type Facing my mortality  Information Concerns Type Lack of info about maintaining fitness  Physical Problem type Pain;Mouth sores/swallowing;Tingling hands/feet;Skin dry/itchy  Referral to clinical social work   Other     Holiday representative follow up needed: No.  If yes, follow up plan:  Philana Younis, [place], LCSW

## 2019-09-28 NOTE — Progress Notes (Signed)
Radiation Oncology         [phone]) [phone] ________________________________  Name: Evelyn Morrison MRN: [id number]  Date: 09/28/2019  DOB: [birth year]  Re-Evaluation Note  CC: Evelyn Mccreedy, MD  Evelyn Merle, MD    ICD-10-CM   1. Ductal carcinoma in situ (DCIS) of right breast  D05.11     Diagnosis: Stage 0 (pTis, pNX) Right Breast UOQ, DCIS, ER- / PR-, Grade 3  Narrative:  The patient returns today to discuss radiation treatment options. She was seen in the multidisciplinary breast clinic on 06/22/2019. At that time, it was recommended that she proceed with genetics testing, right lumpectomy, and adjuvant radiation therapy.  Since consultation, she underwent genetic testing on 06/22/2019. Results were negative with no pathogenic variants identified.  She underwent right breast lumpectomy on 07/29/2019 performed by Dr. Barry Morrison. Pathology from the procedure revealed high-grade ductal carcinoma in situ of the right breast with the lateral margin broadly involved. There was no residual carcinoma of the right superficial breast.  She then underwent a re-excisional right breast lumpectomy on 08/31/2019 performed by Dr. Barry Morrison. Pathology from the procedure did now show any residual carcinoma of the right lateral breast. However, there was noted to be some previous surgical site changes with granulation tissue and vessels with organizing thrombi.  On review of systems, the patient reports ongoing pelvic pain for the last two weeks and increased stress related to moving houses. She denies lymphedema and any other symptoms.  She has minimal discomfort in the breast at this time.  She denies any nipple discharge or bleeding   Allergies:  is allergic to penicillins, ibuprofen, lactose intolerance (gi), milk-related compounds, nyquil multi-symptom [pseudoeph-doxylamine-dm-apap], and latex.  Meds: Current Outpatient Medications  Medication Sig Dispense Refill  . acetaminophen (TYLENOL) 500 MG  tablet Take 500-1,000 mg by mouth every 6 (six) hours as needed for moderate pain.     Marland Kitchen albuterol (PROVENTIL HFA;VENTOLIN HFA) 108 (90 Base) MCG/ACT inhaler Inhale 1-2 puffs into the lungs every 4 (four) hours as needed for wheezing or shortness of breath. 1 Inhaler 0  . aspirin 81 MG EC tablet Take 81 mg by mouth every morning.     . Cholecalciferol (D3 HIGH POTENCY) 50 MCG (2000 UT) CAPS Take 2,000 Units by mouth daily.     . ferrous sulfate 325 (65 FE) MG tablet Take 325 mg by mouth daily with breakfast.    . gabapentin (NEURONTIN) 300 MG capsule Take 300 mg by mouth 2 (two) times daily.    Marland Kitchen glipiZIDE (GLUCOTROL) 5 MG tablet Take 0.5 tablets (2.5 mg total) by mouth daily. 45 tablet 3  . lisinopril-hydrochlorothiazide (PRINZIDE,ZESTORETIC) 20-12.5 MG tablet Take 1 tablet by mouth daily.    Marland Kitchen lovastatin (MEVACOR) 10 MG tablet Take 10 mg by mouth at bedtime.    Marland Kitchen MYRBETRIQ 25 MG TB24 tablet Take 25 mg by mouth daily.     . nitroGLYCERIN (NITROSTAT) 0.4 MG SL tablet Place 0.4 mg under the tongue every 5 (five) minutes as needed for chest pain (3 doses max).     . TURMERIC PO Take 1 capsule by mouth daily as needed (arthritis pain).    Marland Kitchen oxyCODONE (OXY IR/ROXICODONE) 5 MG immediate release tablet Take 0.5-1 tablets (2.5-5 mg total) by mouth every 6 (six) hours as needed for severe pain. (Patient not taking: Reported on 09/28/2019) 10 tablet 0   No current facility-administered medications for this encounter.    Physical Findings: The patient is in no acute distress. Patient is alert  and oriented.  height is 5' (1.524 m) and weight is 155 lb (70.3 kg). Her temperature is 97.7 F (36.5 C). Her blood pressure is 121/83 and her pulse is 91. Her respiration is 20 and oxygen saturation is 100%.  No significant changes. Lungs are clear to auscultation bilaterally. Heart has regular rate and rhythm. No palpable cervical, supraclavicular, or axillary adenopathy. Abdomen soft, non-tender, normal bowel  sounds. Left breast: no palpable mass, nipple discharge or bleeding. Right breast: Well-healing scar in the lateral aspect of the breast at approximately the 9 o'clock position.  Lab Findings: Lab Results  Component Value Date   WBC 5.0 08/25/2019   HGB 11.7 (L) 08/25/2019   HCT 36.9 08/25/2019   MCV 94.1 08/25/2019   PLT 195 08/25/2019    Radiographic Findings: No results found.  Impression: Stage 0 (pTis, pNX) Right Breast UOQ, DCIS, ER- / PR-, Grade 3  She Is now ready to proceed with adjuvant radiation therapy.  Surgical margins cleared with reexcision. I discussed the general course of treatment side effects and potential toxicities of radiation therapy in the situation with the patient and her daughter.  The patient appears to understand and wishes to proceed with planned course of treatment  Plan:  Patient is scheduled for CT simulation tomorrow with radiation treatment to begin next week.  Anticipate the patient will be a good candidate for a hypofractionated course of radiation therapy between 3-1/2 in 4 weeks.   Total time spent in this encounter was 25 minutes which included reviewing the patient's most recent lumpectomy/re-excision, surgical pathology reports, physical examination, and documentation.  -----------------------------------  Evelyn Promise, PhD, MD  This document serves as a record of services personally performed by Evelyn Pray, MD. It was created on his behalf by Evelyn Morrison, a trained medical scribe. The creation of this record is based on the scribe's personal observations and the provider's statements to them. This document has been checked and approved by the attending provider.

## 2019-09-29 ENCOUNTER — Other Ambulatory Visit: Payer: Self-pay

## 2019-09-29 ENCOUNTER — Ambulatory Visit
Admission: RE | Admit: 2019-09-29 | Discharge: 2019-09-29 | Disposition: A | Discharge status: Home / Self Care | Payer: Medicare HMO | Source: Ambulatory Visit | Attending: Radiation Oncology | Admitting: Radiation Oncology

## 2019-09-29 DIAGNOSIS — Z510 Encounter for antineoplastic radiation therapy: Secondary | ICD-10-CM | POA: Diagnosis not present

## 2019-09-29 DIAGNOSIS — D0511 Intraductal carcinoma in situ of right breast: Secondary | ICD-10-CM | POA: Diagnosis not present

## 2019-09-29 NOTE — Progress Notes (Signed)
  Radiation Oncology         [phone] ________________________________  Name: Evelyn Morrison MRN: [id number]  Date: 09/29/2019  DOB: 1940-10-24  SIMULATION AND TREATMENT PLANNING NOTE    ICD-10-CM   1. Ductal carcinoma in situ (DCIS) of right breast  D05.11     DIAGNOSIS: Stage0 (pTis, pNX) RightBreast UOQ,DCIS,ER-/ PR-, Grade 3  NARRATIVE:  The patient was brought to the [facility].  Identity was confirmed.  All relevant records and images related to the planned course of therapy were reviewed.  The patient freely provided informed written consent to proceed with treatment after reviewing the details related to the planned course of therapy. The consent form was witnessed and verified by the simulation staff.  Then, the patient was set-up in a stable reproducible  supine position for radiation therapy.  CT images were obtained.  Surface markings were placed.  The CT images were loaded into the planning software.  Then the target and avoidance structures were contoured.  Treatment planning then occurred.  The radiation prescription was entered and confirmed.  Then, I designed and supervised the construction of a total of 5 medically necessary complex treatment devices.  I have requested : 3D Simulation  I have requested a DVH of the following structures: Lumpectomy cavity, heart, lungs.  I have ordered:dose calc.  PLAN:  The patient will receive 40.05 Gy in 15 fractions followed by a boost to the lumpectomy cavity of 10 Gray in 5 fractions.    Optical Surface Tracking Plan:  Since intensity modulated radiotherapy (IMRT) and 3D conformal radiation treatment methods are predicated on accurate and precise positioning for treatment, intrafraction motion monitoring is medically necessary to ensure accurate and safe treatment delivery.  The ability to quantify intrafraction motion without excessive ionizing radiation dose can only be performed with optical surface tracking.  Accordingly, surface imaging offers the opportunity to obtain 3D measurements of patient position throughout IMRT and 3D treatments without excessive radiation exposure.  I am ordering optical surface tracking for this patient's upcoming course of radiotherapy. ________________________________    Blair Promise, PhD, MD  This document serves as a record of services personally performed by Gery Pray, MD. It was created on his behalf by Clerance Lav, a trained medical scribe. The creation of this record is based on the scribe's personal observations and the provider's statements to them. This document has been checked and approved by the attending provider.

## 2019-09-30 ENCOUNTER — Ambulatory Visit
Admission: RE | Admit: 2019-09-30 | Discharge: 2019-09-30 | Disposition: A | Discharge status: Home / Self Care | Payer: Medicare HMO | Source: Ambulatory Visit | Attending: Radiation Oncology | Admitting: Radiation Oncology

## 2019-09-30 ENCOUNTER — Other Ambulatory Visit: Payer: Self-pay

## 2019-10-04 ENCOUNTER — Encounter: Payer: Self-pay | Admitting: *Deleted

## 2019-10-04 ENCOUNTER — Telehealth: Payer: Self-pay | Admitting: Hematology

## 2019-10-04 DIAGNOSIS — D0511 Intraductal carcinoma in situ of right breast: Secondary | ICD-10-CM | POA: Diagnosis not present

## 2019-10-04 DIAGNOSIS — Z510 Encounter for antineoplastic radiation therapy: Secondary | ICD-10-CM | POA: Diagnosis not present

## 2019-10-04 NOTE — Telephone Encounter (Signed)
Scheduled appt per 7/6 sch message - mailed reminder letter with apt date and time

## 2019-10-05 NOTE — Progress Notes (Signed)
  Radiation Oncology         [phone] ________________________________  Name: Evelyn Morrison MRN: [id number]  Date: 10/06/2019  DOB: [birth year]  Simulation Verification Note    ICD-10-CM   1. Ductal carcinoma in situ (DCIS) of right breast  D05.11     NARRATIVE: The patient was brought to the treatment unit and placed in the planned treatment position. The clinical setup was verified. Then port films were obtained and uploaded to the radiation oncology medical record software.  The treatment beams were carefully compared against the planned radiation fields. The position location and shape of the radiation fields was reviewed. The targeted volume of tissue appears to be appropriately covered by the radiation beams. Organs at risk appear to be excluded as planned.  Based on my personal review, I approved the simulation verification. The patient's treatment will proceed as planned.  -----------------------------------  Blair Promise, PhD, MD  This document serves as a record of services personally performed by Gery Pray, MD. It was created on his behalf by Clerance Lav, a trained medical scribe. The creation of this record is based on the scribe's personal observations and the provider's statements to them. This document has been checked and approved by the attending provider.

## 2019-10-06 ENCOUNTER — Other Ambulatory Visit: Payer: Self-pay

## 2019-10-06 ENCOUNTER — Ambulatory Visit
Admission: RE | Admit: 2019-10-06 | Discharge: 2019-10-06 | Disposition: A | Discharge status: Home / Self Care | Payer: Medicare HMO | Source: Ambulatory Visit | Attending: Radiation Oncology | Admitting: Radiation Oncology

## 2019-10-06 DIAGNOSIS — D0511 Intraductal carcinoma in situ of right breast: Secondary | ICD-10-CM

## 2019-10-06 DIAGNOSIS — Z510 Encounter for antineoplastic radiation therapy: Secondary | ICD-10-CM | POA: Diagnosis not present

## 2019-10-07 ENCOUNTER — Other Ambulatory Visit: Payer: Self-pay

## 2019-10-07 ENCOUNTER — Ambulatory Visit
Admission: RE | Admit: 2019-10-07 | Discharge: 2019-10-07 | Disposition: A | Discharge status: Home / Self Care | Payer: Medicare HMO | Source: Ambulatory Visit | Attending: Radiation Oncology | Admitting: Radiation Oncology

## 2019-10-07 DIAGNOSIS — Z510 Encounter for antineoplastic radiation therapy: Secondary | ICD-10-CM | POA: Diagnosis not present

## 2019-10-07 DIAGNOSIS — D0511 Intraductal carcinoma in situ of right breast: Secondary | ICD-10-CM | POA: Diagnosis not present

## 2019-10-08 ENCOUNTER — Ambulatory Visit: Payer: Medicare HMO

## 2019-10-09 ENCOUNTER — Ambulatory Visit: Payer: Medicare HMO

## 2019-10-10 ENCOUNTER — Other Ambulatory Visit: Payer: Self-pay

## 2019-10-10 ENCOUNTER — Ambulatory Visit
Admission: RE | Admit: 2019-10-10 | Discharge: 2019-10-10 | Disposition: A | Discharge status: Home / Self Care | Payer: Medicare HMO | Source: Ambulatory Visit | Attending: Radiation Oncology | Admitting: Radiation Oncology

## 2019-10-10 DIAGNOSIS — D0511 Intraductal carcinoma in situ of right breast: Secondary | ICD-10-CM | POA: Diagnosis not present

## 2019-10-10 DIAGNOSIS — Z510 Encounter for antineoplastic radiation therapy: Secondary | ICD-10-CM | POA: Diagnosis not present

## 2019-10-11 ENCOUNTER — Other Ambulatory Visit: Payer: Self-pay

## 2019-10-11 ENCOUNTER — Ambulatory Visit
Admission: RE | Admit: 2019-10-11 | Discharge: 2019-10-11 | Disposition: A | Discharge status: Home / Self Care | Payer: Medicare HMO | Source: Ambulatory Visit | Attending: Radiation Oncology | Admitting: Radiation Oncology

## 2019-10-11 DIAGNOSIS — D0511 Intraductal carcinoma in situ of right breast: Secondary | ICD-10-CM

## 2019-10-11 DIAGNOSIS — Z510 Encounter for antineoplastic radiation therapy: Secondary | ICD-10-CM | POA: Diagnosis not present

## 2019-10-11 MED ORDER — ALRA NON-METALLIC DEODORANT (RAD-ONC)
1.0000 "application " | Freq: Once | TOPICAL | Status: AC
  Administered 2019-10-11: 1 via TOPICAL

## 2019-10-11 MED ORDER — SONAFINE EX EMUL
1.0000 "application " | Freq: Two times a day (BID) | CUTANEOUS | Status: DC
  Administered 2019-10-11: 1 via TOPICAL

## 2019-10-11 NOTE — Progress Notes (Signed)

## 2019-10-12 ENCOUNTER — Ambulatory Visit
Admission: RE | Admit: 2019-10-12 | Discharge: 2019-10-12 | Disposition: A | Discharge status: Home / Self Care | Payer: Medicare HMO | Source: Ambulatory Visit | Attending: Radiation Oncology | Admitting: Radiation Oncology

## 2019-10-12 DIAGNOSIS — D0511 Intraductal carcinoma in situ of right breast: Secondary | ICD-10-CM | POA: Diagnosis not present

## 2019-10-12 DIAGNOSIS — Z510 Encounter for antineoplastic radiation therapy: Secondary | ICD-10-CM | POA: Diagnosis not present

## 2019-10-13 ENCOUNTER — Ambulatory Visit
Admission: RE | Admit: 2019-10-13 | Discharge: 2019-10-13 | Disposition: A | Discharge status: Home / Self Care | Payer: Medicare HMO | Source: Ambulatory Visit | Attending: Radiation Oncology | Admitting: Radiation Oncology

## 2019-10-13 ENCOUNTER — Other Ambulatory Visit: Payer: Self-pay

## 2019-10-13 DIAGNOSIS — Z510 Encounter for antineoplastic radiation therapy: Secondary | ICD-10-CM | POA: Diagnosis not present

## 2019-10-13 DIAGNOSIS — D0511 Intraductal carcinoma in situ of right breast: Secondary | ICD-10-CM | POA: Diagnosis not present

## 2019-10-14 ENCOUNTER — Ambulatory Visit
Admission: RE | Admit: 2019-10-14 | Discharge: 2019-10-14 | Disposition: A | Discharge status: Home / Self Care | Payer: Medicare HMO | Source: Ambulatory Visit | Attending: Radiation Oncology | Admitting: Radiation Oncology

## 2019-10-14 ENCOUNTER — Other Ambulatory Visit: Payer: Self-pay

## 2019-10-14 DIAGNOSIS — Z510 Encounter for antineoplastic radiation therapy: Secondary | ICD-10-CM | POA: Diagnosis not present

## 2019-10-14 DIAGNOSIS — D0511 Intraductal carcinoma in situ of right breast: Secondary | ICD-10-CM | POA: Diagnosis not present

## 2019-10-17 ENCOUNTER — Ambulatory Visit
Admission: RE | Admit: 2019-10-17 | Discharge: 2019-10-17 | Disposition: A | Discharge status: Home / Self Care | Payer: Medicare HMO | Source: Ambulatory Visit | Attending: Radiation Oncology | Admitting: Radiation Oncology

## 2019-10-17 ENCOUNTER — Other Ambulatory Visit: Payer: Self-pay

## 2019-10-17 DIAGNOSIS — D0511 Intraductal carcinoma in situ of right breast: Secondary | ICD-10-CM | POA: Diagnosis not present

## 2019-10-17 DIAGNOSIS — Z510 Encounter for antineoplastic radiation therapy: Secondary | ICD-10-CM | POA: Diagnosis not present

## 2019-10-18 ENCOUNTER — Other Ambulatory Visit: Payer: Self-pay

## 2019-10-18 ENCOUNTER — Ambulatory Visit
Admission: RE | Admit: 2019-10-18 | Discharge: 2019-10-18 | Disposition: A | Discharge status: Home / Self Care | Payer: Medicare HMO | Source: Ambulatory Visit | Attending: Radiation Oncology | Admitting: Radiation Oncology

## 2019-10-18 DIAGNOSIS — D0511 Intraductal carcinoma in situ of right breast: Secondary | ICD-10-CM | POA: Diagnosis not present

## 2019-10-18 DIAGNOSIS — Z510 Encounter for antineoplastic radiation therapy: Secondary | ICD-10-CM | POA: Diagnosis not present

## 2019-10-19 ENCOUNTER — Other Ambulatory Visit: Payer: Self-pay

## 2019-10-19 ENCOUNTER — Ambulatory Visit
Admission: RE | Admit: 2019-10-19 | Discharge: 2019-10-19 | Disposition: A | Discharge status: Home / Self Care | Payer: Medicare HMO | Source: Ambulatory Visit | Attending: Radiation Oncology | Admitting: Radiation Oncology

## 2019-10-19 DIAGNOSIS — Z510 Encounter for antineoplastic radiation therapy: Secondary | ICD-10-CM | POA: Diagnosis not present

## 2019-10-19 DIAGNOSIS — D0511 Intraductal carcinoma in situ of right breast: Secondary | ICD-10-CM | POA: Diagnosis not present

## 2019-10-20 ENCOUNTER — Ambulatory Visit
Admission: RE | Admit: 2019-10-20 | Discharge: 2019-10-20 | Disposition: A | Discharge status: Home / Self Care | Payer: Medicare HMO | Source: Ambulatory Visit | Attending: Radiation Oncology | Admitting: Radiation Oncology

## 2019-10-20 ENCOUNTER — Other Ambulatory Visit: Payer: Self-pay

## 2019-10-20 DIAGNOSIS — Z510 Encounter for antineoplastic radiation therapy: Secondary | ICD-10-CM | POA: Diagnosis not present

## 2019-10-20 DIAGNOSIS — D0511 Intraductal carcinoma in situ of right breast: Secondary | ICD-10-CM | POA: Diagnosis not present

## 2019-10-21 ENCOUNTER — Ambulatory Visit
Admission: RE | Admit: 2019-10-21 | Discharge: 2019-10-21 | Disposition: A | Discharge status: Home / Self Care | Payer: Medicare HMO | Source: Ambulatory Visit | Attending: Radiation Oncology | Admitting: Radiation Oncology

## 2019-10-21 DIAGNOSIS — Z510 Encounter for antineoplastic radiation therapy: Secondary | ICD-10-CM | POA: Diagnosis not present

## 2019-10-21 DIAGNOSIS — D0511 Intraductal carcinoma in situ of right breast: Secondary | ICD-10-CM | POA: Diagnosis not present

## 2019-10-24 ENCOUNTER — Other Ambulatory Visit: Payer: Self-pay

## 2019-10-24 ENCOUNTER — Ambulatory Visit
Admission: RE | Admit: 2019-10-24 | Discharge: 2019-10-24 | Disposition: A | Discharge status: Home / Self Care | Payer: Medicare HMO | Source: Ambulatory Visit | Attending: Radiation Oncology | Admitting: Radiation Oncology

## 2019-10-24 DIAGNOSIS — Z510 Encounter for antineoplastic radiation therapy: Secondary | ICD-10-CM | POA: Diagnosis not present

## 2019-10-24 DIAGNOSIS — D0511 Intraductal carcinoma in situ of right breast: Secondary | ICD-10-CM | POA: Diagnosis not present

## 2019-10-25 ENCOUNTER — Ambulatory Visit
Admission: RE | Admit: 2019-10-25 | Discharge: 2019-10-25 | Disposition: A | Discharge status: Home / Self Care | Payer: Medicare HMO | Source: Ambulatory Visit | Attending: Radiation Oncology | Admitting: Radiation Oncology

## 2019-10-25 ENCOUNTER — Other Ambulatory Visit: Payer: Self-pay

## 2019-10-25 DIAGNOSIS — Z510 Encounter for antineoplastic radiation therapy: Secondary | ICD-10-CM | POA: Diagnosis not present

## 2019-10-25 DIAGNOSIS — D0511 Intraductal carcinoma in situ of right breast: Secondary | ICD-10-CM | POA: Diagnosis not present

## 2019-10-26 ENCOUNTER — Other Ambulatory Visit: Payer: Self-pay

## 2019-10-26 ENCOUNTER — Ambulatory Visit
Admission: RE | Admit: 2019-10-26 | Discharge: 2019-10-26 | Disposition: A | Discharge status: Home / Self Care | Payer: Medicare HMO | Source: Ambulatory Visit | Attending: Radiation Oncology | Admitting: Radiation Oncology

## 2019-10-26 DIAGNOSIS — Z510 Encounter for antineoplastic radiation therapy: Secondary | ICD-10-CM | POA: Diagnosis not present

## 2019-10-26 DIAGNOSIS — D0511 Intraductal carcinoma in situ of right breast: Secondary | ICD-10-CM | POA: Diagnosis not present

## 2019-10-26 NOTE — Progress Notes (Signed)
[place]   [phone] [phone]   Clinic Follow up Note   Patient Care Team: Benito Mccreedy, MD as PCP - General (Internal Medicine) Sherren Mocha, MD as PCP - Cardiology (Cardiology) Mauro Kaufmann, RN as Oncology Nurse Navigator Rockwell Germany, RN as Oncology Nurse Navigator [name], MD as Consulting Physician (General Surgery) Truitt Merle, MD as Consulting Physician (Hematology) Gery Pray, MD as Consulting Physician (Radiation Oncology)  Date of Service:  10/31/2019  CHIEF COMPLAINT: F/u of right breast DCIS  SUMMARY OF ONCOLOGIC HISTORY: Oncology History Overview Note  Cancer Staging Ductal carcinoma in situ (DCIS) of right breast Staging form: Breast, AJCC 8th Edition - Clinical stage from 06/08/2019: Stage 0 (cTis (DCIS), cN0, cM0, ER-, PR-, HER2: Not Assessed) - Signed by Truitt Merle, MD on 06/21/2019    Ductal carcinoma in situ (DCIS) of right breast  05/30/2019 Mammogram   Diagnostic Mammogram  IMPRESSION The developing round calcifications in the upper outer aspect posterior depth right breast  measuring 0.6cm are suspicious for malignancy. A biopsy is recommended.    06/08/2019 Cancer Staging   Staging form: Breast, AJCC 8th Edition - Clinical stage from 06/08/2019: Stage 0 (cTis (DCIS), cN0, cM0, ER-, PR-, HER2: Not Assessed) - Signed by Truitt Merle, MD on 06/21/2019   06/08/2019 Initial Biopsy   Diagnosis Breast, right, needle core biopsy, calcifications - DUCTAL CARCINOMA IN SITU. Microscopic Comment The DCIS is of intermediate to high nuclear grade with central necrosis and calcifications. Immunohistochemistry for p63, Calponin and SMM-1 demonstrates the presence of myoepithelium; a distinct invasive component is not identified in the submitted material. Ancillary studies will be reported separately. Results reported to [facility] on 06/09/2019.   06/08/2019 Receptors her2   PROGNOSTIC  INDICATORS Results: IMMUNOHISTOCHEMICAL AND MORPHOMETRIC ANALYSIS PERFORMED MANUALLY Estrogen Receptor: 0%, NEGATIVE Progesterone Receptor: 0%, NEGATIVE   06/14/2019 Initial Diagnosis   Ductal carcinoma in situ (DCIS) of right breast    Genetic Testing   Negative genetic testing. No pathogenic variants identified on the [facility] Common Hereditary Caners Panel. VUS in [place] called c.-30476T>A (Non-coding) and VUS in [place] called c.580G>A (p.Ala194Thr) identified. The report date is 07/04/2019.  The Common Hereditary Cancers Panel offered by [facility] includes sequencing and/or deletion duplication testing of the following 48 genes: APC, ATM, AXIN2, BARD1, BMPR1A, BRCA1, BRCA2, BRIP1, CDH1, CDKN2A (p14ARF), CDKN2A (p16INK4a), CKD4, CHEK2, CTNNA1, DICER1, EPCAM (Deletion/duplication testing only), GREM1 (promoter region deletion/duplication testing only), KIT, MEN1, MLH1, MSH2, MSH3, MSH6, MUTYH, NBN, NF1, NHTL1, PALB2, PDGFRA, PMS2, POLD1, POLE, PTEN, RAD50, RAD51C, RAD51D, RNF43, SDHB, SDHC, SDHD, SMAD4, SMARCA4. STK11, TP53, TSC1, TSC2, and VHL.  The following genes were evaluated for sequence changes only: SDHA and HOXB13 c.251G>A variant only.   07/29/2019 Surgery   RIGHT BREAST LUMPECTOMY WITH RADIOACTIVE SEED LOCALIZATION by Dr Barry Dienes    07/29/2019 Pathology Results   FINAL MICROSCOPIC DIAGNOSIS:   A. BREAST, RIGHT SUPERFICIAL, LUMPECTOMY:  -  No residual carcinoma identified   B. BREAST, RIGHT, LUMPECTOMY:  -  Ductal carcinoma in situ, high grade, 1.2 cm  -  Margins involved by carcinoma (lateral, broadly)  -  Previous biopsy site changes  -  See oncology table and comment below below    08/31/2019 Surgery   RE-EXCISION OF RIGHT BREAST LUMPECTOMY by Dr Barry Dienes    08/31/2019 Pathology Results   FINAL MICROSCOPIC DIAGNOSIS:   A. BREAST, RIGHT LATERAL ASPECT, RE-EXCISION:  -  Previous surgical site changes with granulation tissue  -  Vessels  with organizing thrombi  -  No residual carcinoma  identified    10/06/2019 - 11/02/2019 Radiation Therapy   Adjuvant Radiation with Dr Sondra Come       CURRENT THERAPY:  Adjuvant Radiation with Dr Sondra Come 10/06/19-11/02/19   INTERVAL HISTORY:  [name] is here for a follow up. She presents to the clinic alone. She notes she is doing well. She notes she is tolerating RT well. She has been using topical cream for skin irritation. She has no real pain from this. She notes after re-excision surgery she only has mild right breast pain, controlled with Tylenol. She notes she has limited right shoulder ROM.  She notes she ran out of certain medications that she cannot get refilled until she sees her PCP again. She notes she will be SOB when going up and down her steps, she lives in 3 floors.     REVIEW OF SYSTEMS:   Constitutional: Denies fevers, chills or abnormal weight loss Eyes: Denies blurriness of vision Ears, nose, mouth, throat, and face: Denies mucositis or sore throat Respiratory: Denies cough, dyspnea or wheezes Cardiovascular: Denies palpitation, chest discomfort or lower extremity swelling Gastrointestinal:  Denies nausea, heartburn or change in bowel habits Skin: Denies abnormal skin rashes (+) Right breast and axilla skin irritation from RT.  Lymphatics: Denies new lymphadenopathy or easy bruising MSK: (+) Limited right shoulder ROM Neurological:Denies numbness, tingling or new weaknesses Behavioral/Psych: Mood is stable, no new changes  All other systems were reviewed with the patient and are negative.  MEDICAL HISTORY:  Past Medical History:  Diagnosis Date  . Anemia   . Anxiety   . Arthritis   . CAD (coronary artery disease)    Myoview 12/17: EF 66, apical and apical lateral defect - likely attenuation artifact, no ischemia; Low Risk // Myoview 3/19:  EF 74, no ischemia or scar; low risk   . Cataract, diabetic ([place])   . Chest pain   . Chronic kidney disease    stage 3  . Constipation   . Contusion of arm   .  Diabetes mellitus   . Diabetic retinopathy   . DM (diabetes mellitus) ([place])   . Dyspnea    SOb on exertion  . Esophageal stricture   . Family history of ovarian cancer   . Fatigue   . Glaucoma   . Heart burn   . Hiatal hernia   . History of echocardiogram    Echo 3/19:  Mild LVH, EF 60-65, no RWMA, Gr 1 DD, PASP 34  . HLD (hyperlipidemia)   . Hypertension   . Joint stiffness of hand   . Myocardial infarction ([place])    2004, stent  . Urinary incontinence   . Vitamin D deficiency     SURGICAL HISTORY: Past Surgical History:  Procedure Laterality Date  . adenosine cardillite  05/16/05   negative signif. myocardial ischemic  . BREAST LUMPECTOMY WITH RADIOACTIVE SEED LOCALIZATION Right 07/29/2019   Procedure: RIGHT BREAST LUMPECTOMY WITH RADIOACTIVE SEED LOCALIZATION;  Surgeon: [name], MD;  Location: [place];  Service: General;  Laterality: Right;  . CARDIAC CATHETERIZATION  10/04   s/p stent LV 45%  . CATARACT EXTRACTION     bilateral  . CESAREAN SECTION  84, 86   ectopic pregnancy  . CHOLECYSTECTOMY  1998  . CORONARY ANGIOPLASTY WITH STENT PLACEMENT  10/04  . PARTIAL HYSTERECTOMY    . RE-EXCISION OF BREAST LUMPECTOMY Right 08/31/2019   Procedure: RE-EXCISION OF RIGHT BREAST LUMPECTOMY;  Surgeon: [name], MD;  Location: [place];  Service: General;  Laterality: Right;    I have reviewed the social history and family history with the patient and they are unchanged from previous note.  ALLERGIES:  is allergic to penicillins, ibuprofen, lactose intolerance (gi), milk-related compounds, nyquil multi-symptom [pseudoeph-doxylamine-dm-apap], and latex.  MEDICATIONS:  Current Outpatient Medications  Medication Sig Dispense Refill  . acetaminophen (TYLENOL) 500 MG tablet Take 500-1,000 mg by mouth every 6 (six) hours as needed for moderate pain.     Marland Kitchen albuterol (PROVENTIL HFA;VENTOLIN HFA) 108 (90 Base) MCG/ACT inhaler Inhale 1-2 puffs into the lungs every  4 (four) hours as needed for wheezing or shortness of breath. 1 Inhaler 0  . aspirin 81 MG EC tablet Take 81 mg by mouth every morning.     . Cholecalciferol (D3 HIGH POTENCY) 50 MCG (2000 UT) CAPS Take 2,000 Units by mouth daily.     . ferrous sulfate 325 (65 FE) MG tablet Take 325 mg by mouth daily with breakfast.    . gabapentin (NEURONTIN) 300 MG capsule Take 300 mg by mouth 2 (two) times daily.    Marland Kitchen glipiZIDE (GLUCOTROL) 5 MG tablet Take 0.5 tablets (2.5 mg total) by mouth daily. 45 tablet 3  . lisinopril-hydrochlorothiazide (PRINZIDE,ZESTORETIC) 20-12.5 MG tablet Take 1 tablet by mouth daily.    Marland Kitchen lovastatin (MEVACOR) 10 MG tablet Take 10 mg by mouth at bedtime.    Marland Kitchen MYRBETRIQ 25 MG TB24 tablet Take 25 mg by mouth daily.     . nitroGLYCERIN (NITROSTAT) 0.4 MG SL tablet Place 0.4 mg under the tongue every 5 (five) minutes as needed for chest pain (3 doses max).     Marland Kitchen oxyCODONE (OXY IR/ROXICODONE) 5 MG immediate release tablet Take 0.5-1 tablets (2.5-5 mg total) by mouth every 6 (six) hours as needed for severe pain. (Patient not taking: Reported on 09/28/2019) 10 tablet 0  . TURMERIC PO Take 1 capsule by mouth daily as needed (arthritis pain).     No current facility-administered medications for this visit.    PHYSICAL EXAMINATION: ECOG PERFORMANCE STATUS: 1 - Symptomatic but completely ambulatory  Vitals:   10/31/19 1004  BP: (!) 130/86  Pulse: 86  Resp: 18  Temp: 97.9 F (36.6 C)  SpO2: 100%   Filed Weights   10/31/19 1004  Weight: 154 lb 9.6 oz (70.1 kg)    GENERAL:alert, no distress and comfortable SKIN: skin color, texture, turgor are normal, no rashes or significant lesions EYES: normal, Conjunctiva are pink and non-injected, sclera clear  NECK: supple, thyroid normal size, non-tender, without nodularity LYMPH:  no palpable lymphadenopathy in the cervical, axillary  LUNGS: clear to auscultation and percussion with normal breathing effort HEART: regular rate & rhythm  and no murmurs and no lower extremity edema ABDOMEN:abdomen soft, non-tender and normal bowel sounds Musculoskeletal:no cyanosis of digits and no clubbing  NEURO: alert & oriented x 3 with fluent speech, no focal motor/sensory deficits BREAST: S/p right lumpectomy: Surgical incision healed well with mild scar tissue (+) Skin hyperpigmentation of right breast from RT. No palpable mass, nodules or adenopathy bilaterally. Breast exam benign.   LABORATORY DATA:  I have reviewed the data as listed CBC Latest Ref Rng & Units 08/25/2019 06/22/2019 06/28/2018  WBC 4.0 - 10.5 K/uL 5.0 5.6 5.8  Hemoglobin 12.0 - 15.0 g/dL 11.7(L) 12.8 12.8  Hematocrit 36 - 46 % 36.9 39.9 40.7  Platelets 150 - 400 K/uL 195 194 137(L)     CMP Latest  Ref Rng & Units 08/25/2019 07/27/2019 06/22/2019  Glucose 70 - 99 mg/dL 110(H) 104(H) 138(H)  BUN 8 - 23 mg/dL 18 29(H) 21  Creatinine 0.44 - 1.00 mg/dL 1.31(H) 1.49(H) 1.48(H)  Sodium 135 - 145 mmol/L 141 138 142  Potassium 3.5 - 5.1 mmol/L 3.7 3.8 3.9  Chloride 98 - 111 mmol/L 108 103 105  CO2 22 - 32 mmol/L _0 Calcium 8.9 - 10.3 mg/dL 9.6 9.5 10.0  Total Protein 6.5 - 8.1 g/dL - - 8.0  Total Bilirubin 0.3 - 1.2 mg/dL - - 0.3  Alkaline Phos 38 - 126 U/L - - 76  AST 15 - 41 U/L - - 21  ALT 0 - 44 U/L - - 18      RADIOGRAPHIC STUDIES: I have personally reviewed the radiological images as listed and agreed with the findings in the report. No results found.   ASSESSMENT & PLAN:  Evelyn Morrison is a 79 y.o. female with    1. Ductal carcinoma in situ (DCIS) of right breast, Stage 0, ER-/PR- -She was diagnosed in 05/2019. She was found to have 0.6cm right breast mass in right breast found by mammogram and biopsy showed ductal carcinoma in situ.  -She underwent right lumpectomy on 07/29/19 with Dr Barry Dienes. Path showed positive margins of DCIS so she underwent re-excision surgery on 08/31/19. Pathology showed no residual carcinoma.  -Surgery was curative so any  further treatment is preventative to reduce her risk of future breast cancer.  -She is undergoing adjuvant Radiation with Dr Sondra Come since 10/06/19. Plan to complete 11/02/19. She is tolerating well with manageable skin irritation.  -Given her negative ER and PR, I do not recommend antiestrogen therapy given there is no benefit. -I discussed DCIS and ER/PR negative disease is more aggressive and puts her at a higher risk of developing future breast cancer.  -We also discussed the breast cancer surveillance after her surgery. She will continue annual screening mammogram, self exams, and a routine office visit and exam with [place]. I discussed the option of additional screening with annual breast MRIs. I also discussed Abbreviated MRIs which have $400 out-of-pocket cost if insurance does not cover standard MRI. She is interested.  -Next mammogram in 05/2020 and MRI breast in 10/2020, then continue yearly.  -Survivorship clinic in 3 months with NP Lacie. F/u with me in 1 year.    2. Comorbidities: DM, HTN, Stage III CKD, CAD -She had a MI in 2004 and had stent placed.  -Her HTN and DM is controlled and her CKD is stable.  -I encouraged her to continue medications and f/u with her physicians.    3. Arthritis, Leg cramps -She has been having leg cramps more often. She has been using OTC arthritis Tylenol.  -She was previously on oral calcium but due to her CKD was switching to Vit D.    4. Genetic Testing was negative for pathogenetic mutations    5. Right breast pain, limited ROM of right shoulder -She notes after second breast surgery her right shoulder ROM further decreased. I recommend PT to help her. She is agreeable, I will send referral.  -She also notes right breast pain after second breast surgery. Pain manageable and controlled on Tylenol.    PLAN:  -Send PT referral  -Survivorship clinic in 2-3 months with NP Lacie  -Mammogram in 05/2020 -MRI in 10/2020 -F/u in 1 year after breast  MRI   No problem-specific Assessment & Plan notes found for this encounter.  Orders Placed This Encounter  Procedures  . MM DIAG BREAST TOMO BILATERAL    Standing Status:   Future    Standing Expiration Date:   10/30/2020    Scheduling Instructions:     [facility]    Order Specific Question:   Reason for Exam (SYMPTOM  OR DIAGNOSIS REQUIRED)    Answer:   screening    Order Specific Question:   Preferred imaging location?    Answer:   External  . MR BREAST BILATERAL W WO CONTRAST INC CAD    Standing Status:   Future    Standing Expiration Date:   10/30/2020    Order Specific Question:   If indicated for the ordered procedure, I authorize the administration of contrast media per Radiology protocol    Answer:   Yes    Order Specific Question:   What is the patient's sedation requirement?    Answer:   No Sedation    Order Specific Question:   Does the patient have a pacemaker or implanted devices?    Answer:   No    Order Specific Question:   Radiology Contrast Protocol - do NOT remove file path    Answer:   [email or web address]    Order Specific Question:   Preferred imaging location?    Answer:   [facility] (table limit-550lbs)  . Ambulatory referral to Physical Therapy    Referral Priority:   Routine    Referral Type:   Physical Medicine    Referral Reason:   Specialty Services Required    Requested Specialty:   Physical Therapy    Number of Visits Requested:   1   All questions were answered. The patient knows to call the clinic with any problems, questions or concerns. No barriers to learning was detected. The total time spent in the appointment was 30 minutes.     Truitt Merle, MD 10/31/2019   I, Joslyn Devon, am acting as scribe for Truitt Merle, MD.   I have reviewed the above documentation for accuracy and completeness, and I agree with the above.

## 2019-10-27 ENCOUNTER — Ambulatory Visit
Admission: RE | Admit: 2019-10-27 | Discharge: 2019-10-27 | Disposition: A | Discharge status: Home / Self Care | Payer: Medicare HMO | Source: Ambulatory Visit | Attending: Radiation Oncology | Admitting: Radiation Oncology

## 2019-10-27 ENCOUNTER — Other Ambulatory Visit: Payer: Self-pay

## 2019-10-27 DIAGNOSIS — D0511 Intraductal carcinoma in situ of right breast: Secondary | ICD-10-CM | POA: Diagnosis not present

## 2019-10-27 DIAGNOSIS — Z510 Encounter for antineoplastic radiation therapy: Secondary | ICD-10-CM | POA: Diagnosis not present

## 2019-10-28 ENCOUNTER — Ambulatory Visit
Admission: RE | Admit: 2019-10-28 | Discharge: 2019-10-28 | Disposition: A | Discharge status: Home / Self Care | Payer: Medicare HMO | Source: Ambulatory Visit | Attending: Radiation Oncology | Admitting: Radiation Oncology

## 2019-10-28 ENCOUNTER — Other Ambulatory Visit: Payer: Self-pay

## 2019-10-28 DIAGNOSIS — D0511 Intraductal carcinoma in situ of right breast: Secondary | ICD-10-CM | POA: Diagnosis not present

## 2019-10-28 DIAGNOSIS — Z510 Encounter for antineoplastic radiation therapy: Secondary | ICD-10-CM | POA: Diagnosis not present

## 2019-10-31 ENCOUNTER — Encounter: Payer: Self-pay | Admitting: Hematology

## 2019-10-31 ENCOUNTER — Ambulatory Visit
Admission: RE | Admit: 2019-10-31 | Discharge: 2019-10-31 | Disposition: A | Discharge status: Home / Self Care | Payer: Medicare HMO | Source: Ambulatory Visit | Attending: Radiation Oncology | Admitting: Radiation Oncology

## 2019-10-31 ENCOUNTER — Other Ambulatory Visit: Payer: Self-pay

## 2019-10-31 ENCOUNTER — Inpatient Hospital Stay: Payer: Medicare HMO | Attending: Hematology | Admitting: Hematology

## 2019-10-31 VITALS — BP 130/86 | HR 86 | Temp 97.9°F | Resp 18 | Ht 61.0 in | Wt 154.6 lb

## 2019-10-31 DIAGNOSIS — Z7982 Long term (current) use of aspirin: Secondary | ICD-10-CM | POA: Insufficient documentation

## 2019-10-31 DIAGNOSIS — I10 Essential (primary) hypertension: Secondary | ICD-10-CM | POA: Insufficient documentation

## 2019-10-31 DIAGNOSIS — D0511 Intraductal carcinoma in situ of right breast: Secondary | ICD-10-CM | POA: Insufficient documentation

## 2019-10-31 DIAGNOSIS — E119 Type 2 diabetes mellitus without complications: Secondary | ICD-10-CM | POA: Insufficient documentation

## 2019-10-31 DIAGNOSIS — Z510 Encounter for antineoplastic radiation therapy: Secondary | ICD-10-CM | POA: Diagnosis not present

## 2019-10-31 DIAGNOSIS — Z79899 Other long term (current) drug therapy: Secondary | ICD-10-CM | POA: Insufficient documentation

## 2019-10-31 DIAGNOSIS — Z923 Personal history of irradiation: Secondary | ICD-10-CM | POA: Diagnosis not present

## 2019-10-31 DIAGNOSIS — N183 Chronic kidney disease, stage 3 unspecified: Secondary | ICD-10-CM | POA: Diagnosis not present

## 2019-10-31 DIAGNOSIS — Z7984 Long term (current) use of oral hypoglycemic drugs: Secondary | ICD-10-CM | POA: Insufficient documentation

## 2019-10-31 DIAGNOSIS — Z9049 Acquired absence of other specified parts of digestive tract: Secondary | ICD-10-CM | POA: Insufficient documentation

## 2019-11-01 ENCOUNTER — Encounter: Payer: Self-pay | Admitting: Radiation Oncology

## 2019-11-01 ENCOUNTER — Other Ambulatory Visit: Payer: Self-pay

## 2019-11-01 ENCOUNTER — Encounter: Payer: Self-pay | Admitting: *Deleted

## 2019-11-01 ENCOUNTER — Ambulatory Visit
Admission: RE | Admit: 2019-11-01 | Discharge: 2019-11-01 | Disposition: A | Discharge status: Home / Self Care | Payer: Medicare HMO | Source: Ambulatory Visit | Attending: Radiation Oncology | Admitting: Radiation Oncology

## 2019-11-01 ENCOUNTER — Telehealth: Payer: Self-pay | Admitting: Hematology

## 2019-11-01 DIAGNOSIS — Z510 Encounter for antineoplastic radiation therapy: Secondary | ICD-10-CM | POA: Diagnosis not present

## 2019-11-01 DIAGNOSIS — D0511 Intraductal carcinoma in situ of right breast: Secondary | ICD-10-CM | POA: Diagnosis not present

## 2019-11-01 NOTE — Telephone Encounter (Signed)
Scheduled per 8/2 los. Pt is aware of appt time and date. 

## 2019-11-02 ENCOUNTER — Encounter: Payer: Self-pay | Admitting: Radiation Oncology

## 2019-11-02 ENCOUNTER — Ambulatory Visit
Admission: RE | Admit: 2019-11-02 | Discharge: 2019-11-02 | Disposition: A | Discharge status: Home / Self Care | Payer: Medicare HMO | Source: Ambulatory Visit | Attending: Radiation Oncology | Admitting: Radiation Oncology

## 2019-11-02 ENCOUNTER — Other Ambulatory Visit: Payer: Self-pay

## 2019-11-02 DIAGNOSIS — D0511 Intraductal carcinoma in situ of right breast: Secondary | ICD-10-CM | POA: Diagnosis not present

## 2019-11-02 DIAGNOSIS — Z510 Encounter for antineoplastic radiation therapy: Secondary | ICD-10-CM | POA: Diagnosis not present

## 2019-11-07 NOTE — Progress Notes (Incomplete)
  Patient Name: Evelyn Morrison MRN: [id number] DOB: February 20, 1941 Referring Physician: Benito Mccreedy (Profile Not Attached) Date of Service: 11/02/2019 [facility] [place]                                                        End Of Treatment Note  Diagnoses: D05.11-Intraductal carcinoma in situ of right breast  Cancer Staging: Stage0(pTis, pNX)RightBreast UOQ,DCIS,ER-/ PR-, Grade 3  Intent: Curative  Radiation Treatment Dates: 10/06/2019 through 11/02/2019 Site Technique Total Dose (Gy) Dose per Fx (Gy) Completed Fx Beam Energies  Breast, Right: Breast_Rt 3D 40.05/40.05 2.67 15/15 6X, 10X  Breast, Right: Breast_Rt_Bst 3D 10/10 2 5/5 6X, 10X   Narrative: The patient tolerated radiation therapy relatively well. She did report mild to moderate fatigue, difficulty lifting right arm above her head or reaching behind herself, mild swelling/heavy feeling in the right breast, and intermittent, sharp, shooting right breast pain that was relieved with Tylenol. She denied chest pain and shortness of breath. The right breast area was noted to have some hyperpigmentation changes without skin breakdown. The skin remained intact but she did develop some pink coloration along the right clavicle and hyperpigmentation under the right axilla.  Plan: The patient will follow-up with radiation oncology in one month.  ________________________________________________   Blair Promise, PhD, MD  This document serves as a record of services personally performed by Gery Pray, MD. It was created on his behalf by Clerance Lav, a trained medical scribe. The creation of this record is based on the scribe's personal observations and the provider's statements to them. This document has been checked and approved by the attending provider.

## 2019-11-30 DIAGNOSIS — Z20822 Contact with and (suspected) exposure to covid-19: Secondary | ICD-10-CM | POA: Diagnosis not present

## 2019-12-07 NOTE — Progress Notes (Signed)
Radiation Oncology         [phone] ________________________________  Name: Evelyn Morrison MRN: [id number]  Date: 12/08/2019  DOB: October 25, 1940  Follow-Up Visit Note  CC: Benito Mccreedy, MD  Benito Mccreedy, MD    ICD-10-CM   1. Ductal carcinoma in situ (DCIS) of right breast  D05.11     Diagnosis: Stage0(pTis, pNX)RightBreast UOQ,DCIS,ER-/ PR-, Grade 3  Interval Since Last Radiation: One month and five days.  Radiation Treatment Dates: 10/06/2019 through 11/02/2019 Site Technique Total Dose (Gy) Dose per Fx (Gy) Completed Fx Beam Energies  Breast, Right: Breast_Rt 3D 40.05/40.05 2.67 15/15 6X, 10X  Breast, Right: Breast_Rt_Bst 3D 10/10 2 5/5 6X, 10X    Narrative:  The patient returns today for routine follow-up. She was last seen by Dr. Burr Medico on 10/31/2019, during which time she was referred to PT and survivorship clinic. Her next mammogram is due in February of 2022 and her next breast MRI is due in August of 2022.  On review of systems, she reports mild discomfort in the right breast but no actual pain. She denies swelling in her right arm or hand.  Physical therapy is helping with her right arm and shoulder mobility.   She has minimal fatigue at this time                       ALLERGIES:  is allergic to penicillins, ibuprofen, lactose intolerance (gi), milk-related compounds, nyquil multi-symptom [pseudoeph-doxylamine-dm-apap], and latex.  Meds: Current Outpatient Medications  Medication Sig Dispense Refill  . acetaminophen (TYLENOL) 500 MG tablet Take 500-1,000 mg by mouth every 6 (six) hours as needed for moderate pain.     Marland Kitchen albuterol (PROVENTIL HFA;VENTOLIN HFA) 108 (90 Base) MCG/ACT inhaler Inhale 1-2 puffs into the lungs every 4 (four) hours as needed for wheezing or shortness of breath. 1 Inhaler 0  . aspirin 81 MG EC tablet Take 81 mg by mouth every morning.     . Cholecalciferol (D3 HIGH POTENCY) 50 MCG (2000 UT) CAPS Take 2,000 Units by mouth daily.       . ferrous sulfate 325 (65 FE) MG tablet Take 325 mg by mouth daily with breakfast.    . gabapentin (NEURONTIN) 300 MG capsule Take 300 mg by mouth 2 (two) times daily.    Marland Kitchen lisinopril-hydrochlorothiazide (PRINZIDE,ZESTORETIC) 20-12.5 MG tablet Take 1 tablet by mouth daily.    Marland Kitchen lovastatin (MEVACOR) 10 MG tablet Take 10 mg by mouth at bedtime.    Marland Kitchen MYRBETRIQ 25 MG TB24 tablet Take 25 mg by mouth daily.     . nitroGLYCERIN (NITROSTAT) 0.4 MG SL tablet Place 0.4 mg under the tongue every 5 (five) minutes as needed for chest pain (3 doses max).     Marland Kitchen oxyCODONE (OXY IR/ROXICODONE) 5 MG immediate release tablet Take 0.5-1 tablets (2.5-5 mg total) by mouth every 6 (six) hours as needed for severe pain. 10 tablet 0  . TURMERIC PO Take 1 capsule by mouth daily as needed (arthritis pain).    Marland Kitchen glipiZIDE (GLUCOTROL) 5 MG tablet Take 0.5 tablets (2.5 mg total) by mouth daily. (Patient not taking: Reported on 12/08/2019) 45 tablet 3   No current facility-administered medications for this encounter.    Physical Findings: The patient is in no acute distress. Patient is alert and oriented.  height is 5\' 1"  (1.549 m) and weight is 155 lb 3.2 oz (70.4 kg). Her temperature is 97.8 F (36.6 C). Her blood pressure is 136/93 (abnormal) and her  pulse is 91. Her respiration is 20 and oxygen saturation is 100%.  No significant changes. Lungs are clear to auscultation bilaterally. Heart has regular rate and rhythm. No palpable cervical, supraclavicular, or axillary adenopathy. Abdomen soft, non-tender, normal bowel sounds. Left breast: No palpable mass, nipple discharge, or bleeding.  Right breast: Mild hyperpigmentation changes noted.  Skin is healed well.  Minimal edema in the breast.  No dominant mass appreciated breast nipple discharge or bleeding  Lab Findings: Lab Results  Component Value Date   WBC 5.0 08/25/2019   HGB 11.7 (L) 08/25/2019   HCT 36.9 08/25/2019   MCV 94.1 08/25/2019   PLT 195 08/25/2019     Radiographic Findings: No results found.  Impression: Stage0(pTis, pNX)RightBreast UOQ,DCIS,ER-/ PR-, Grade 3  The patient is recovering from the effects of radiation.  Overall she tolerated radiation therapy well  Plan: The patient is scheduled to see Cira Rue, NP, on 01/02/2020 for survivorship clinic. She is scheduled to follow-up with Dr. Burr Medico on 11/05/2020.  Patient's close follow-up with medical oncology I have not scheduled her for formal follow-up appointment but would be glad to see her as needed.  T  ____________________________________   Blair Promise, PhD, MD  This document serves as a record of services personally performed by Gery Pray, MD. It was created on his behalf by Clerance Lav, a trained medical scribe. The creation of this record is based on the scribe's personal observations and the provider's statements to them. This document has been checked and approved by the attending provider.

## 2019-12-08 ENCOUNTER — Other Ambulatory Visit: Payer: Self-pay

## 2019-12-08 ENCOUNTER — Encounter: Payer: Self-pay | Admitting: Radiation Oncology

## 2019-12-08 ENCOUNTER — Ambulatory Visit
Admission: RE | Admit: 2019-12-08 | Discharge: 2019-12-08 | Disposition: A | Discharge status: Home / Self Care | Payer: Medicare HMO | Source: Ambulatory Visit | Attending: Radiation Oncology | Admitting: Radiation Oncology

## 2019-12-08 DIAGNOSIS — Z79899 Other long term (current) drug therapy: Secondary | ICD-10-CM | POA: Insufficient documentation

## 2019-12-08 DIAGNOSIS — Z7982 Long term (current) use of aspirin: Secondary | ICD-10-CM | POA: Insufficient documentation

## 2019-12-08 DIAGNOSIS — Z171 Estrogen receptor negative status [ER-]: Secondary | ICD-10-CM | POA: Insufficient documentation

## 2019-12-08 DIAGNOSIS — D0511 Intraductal carcinoma in situ of right breast: Secondary | ICD-10-CM | POA: Insufficient documentation

## 2019-12-08 DIAGNOSIS — R5383 Other fatigue: Secondary | ICD-10-CM | POA: Insufficient documentation

## 2019-12-08 DIAGNOSIS — Z923 Personal history of irradiation: Secondary | ICD-10-CM | POA: Insufficient documentation

## 2019-12-08 NOTE — Progress Notes (Signed)
Patient here for a 1 month f/u visit with Dr. Sondra Come. Patient reports intermittent pain in her right breast.Reports has difficulty lifting her right arm over her head. Reports right pelvic pain in the mornings. Reports skin intact.  BP (!) 136/93 (BP Location: Right Arm, Patient Position: Sitting, Cuff Size: Normal)   Pulse 91   Temp 97.8 F (36.6 C)   Resp 20   Ht 5\' 1"  (1.549 m)   Wt 155 lb 3.2 oz (70.4 kg)   SpO2 100%   BMI 29.32 kg/m    Wt Readings from Last 3 Encounters:  12/08/19 155 lb 3.2 oz (70.4 kg)  10/31/19 154 lb 9.6 oz (70.1 kg)  09/28/19 155 lb (70.3 kg)

## 2019-12-20 ENCOUNTER — Ambulatory Visit: Payer: Medicare HMO | Attending: Hematology | Admitting: Physical Therapy

## 2019-12-20 ENCOUNTER — Other Ambulatory Visit: Payer: Self-pay

## 2019-12-20 DIAGNOSIS — N183 Chronic kidney disease, stage 3 unspecified: Secondary | ICD-10-CM | POA: Diagnosis not present

## 2019-12-20 DIAGNOSIS — M6281 Muscle weakness (generalized): Secondary | ICD-10-CM | POA: Diagnosis not present

## 2019-12-20 DIAGNOSIS — L599 Disorder of the skin and subcutaneous tissue related to radiation, unspecified: Secondary | ICD-10-CM | POA: Insufficient documentation

## 2019-12-20 DIAGNOSIS — Z483 Aftercare following surgery for neoplasm: Secondary | ICD-10-CM

## 2019-12-20 DIAGNOSIS — M25611 Stiffness of right shoulder, not elsewhere classified: Secondary | ICD-10-CM | POA: Insufficient documentation

## 2019-12-20 DIAGNOSIS — N189 Chronic kidney disease, unspecified: Secondary | ICD-10-CM | POA: Diagnosis not present

## 2019-12-20 DIAGNOSIS — M25511 Pain in right shoulder: Secondary | ICD-10-CM | POA: Diagnosis not present

## 2019-12-20 NOTE — Patient Instructions (Signed)
.  SHOULDER: Flexion - Supine (Cane)        Cancer Rehab [phone]    Hold cane in both hands. Raise arms up overhead. Do not allow back to arch. Hold _5__ seconds. Do __5-10__ times; __1-2__ times a day.

## 2019-12-20 NOTE — Therapy (Signed)
[place], [place], [address] Phone: [phone]   Fax:  [phone]  Physical Therapy Evaluation  Patient Details  Name: Evelyn Morrison MRN: [id number] Date of Birth: 1940/12/05 Referring Provider (PT): Dr. Burr Medico   Encounter Date: 12/20/2019   PT End of Session - 12/20/19 2009    Visit Number 1    Number of Visits 9    Date for PT Re-Evaluation 01/27/20    PT Start Time 1600    PT Stop Time 1645    PT Time Calculation (min) 45 min    Activity Tolerance Patient tolerated treatment well    Behavior During Therapy [facility] for tasks assessed/performed           Past Medical History:  Diagnosis Date  . Anemia   . Anxiety   . Arthritis   . CAD (coronary artery disease)    Myoview 12/17: EF 66, apical and apical lateral defect - likely attenuation artifact, no ischemia; Low Risk // Myoview 3/19:  EF 74, no ischemia or scar; low risk   . Cataract, diabetic ([place])   . Chest pain   . Chronic kidney disease    stage 3  . Constipation   . Contusion of arm   . Diabetes mellitus   . Diabetic retinopathy   . DM (diabetes mellitus) ([place])   . Dyspnea    SOb on exertion  . Esophageal stricture   . Family history of ovarian cancer   . Fatigue   . Glaucoma   . Heart burn   . Hiatal hernia   . History of echocardiogram    Echo 3/19:  Mild LVH, EF 60-65, no RWMA, Gr 1 DD, PASP 34  . HLD (hyperlipidemia)   . Hypertension   . Joint stiffness of hand   . Myocardial infarction ([place])    2004, stent  . Urinary incontinence   . Vitamin D deficiency     Past Surgical History:  Procedure Laterality Date  . adenosine cardillite  05/16/05   negative signif. myocardial ischemic  . BREAST LUMPECTOMY WITH RADIOACTIVE SEED LOCALIZATION Right 07/29/2019   Procedure: RIGHT BREAST LUMPECTOMY WITH RADIOACTIVE SEED LOCALIZATION;  Surgeon: [name], MD;  Location: [facility];  Service: General;   Laterality: Right;  . CARDIAC CATHETERIZATION  10/04   s/p stent LV 45%  . CATARACT EXTRACTION     bilateral  . CESAREAN SECTION  84, 86   ectopic pregnancy  . CHOLECYSTECTOMY  1998  . CORONARY ANGIOPLASTY WITH STENT PLACEMENT  10/04  . PARTIAL HYSTERECTOMY    . RE-EXCISION OF BREAST LUMPECTOMY Right 08/31/2019   Procedure: RE-EXCISION OF RIGHT BREAST LUMPECTOMY;  Surgeon: [name], MD;  Location: [facility];  Service: General;  Laterality: Right;    There were no vitals filed for this visit.    Subjective Assessment - 12/20/19 1614    Subjective Pt states that she had a fall last week and is still having problems with her left side.  She feels like she is just banged up from the fall and will see her family doctor next week. She is here for her right shoulder. She feels that her breast is feeling better but she has pulling in her right axilla when she reaches overhead. She is going out of town this weekend She will need help with transportation    Patient is accompained by: Interpreter    Pertinent History Right breast cancer diagnosed 06/08/2019 with lumpectomy 07/29/2019 and  reexcision on 08/31/2019, Pt did not have chemo but she had radiatiion from 10/06/2019 to 11/02/2019 Pt states history inclused kidney problems that she will have tested soon    Patient Stated Goals to get right shoulder back to normal    Currently in Pain? No/denies   only has pain when she reaches overhead             [facility] PT Assessment - 12/20/19 0001      Assessment   Medical Diagnosis right breast cancer     Referring Provider (PT) Dr. Burr Medico    Onset Date/Surgical Date 06/08/19    Hand Dominance Left      Precautions   Precautions --   at risk for lymphedema due to radiation      Restrictions   Weight Bearing Restrictions No      Balance Screen   Has the patient fallen in the past 6 months Yes   will not address her balance this episode   How many times? once    Has the patient had a decrease in  activity level because of a fear of falling?  Yes    Is the patient reluctant to leave their home because of a fear of falling?  No      Home Social worker Private residence    Living Arrangements Children    Available Help at Discharge Available PRN/intermittently    Additional Comments Pt has steps in home and she says she does them a few times a day       Prior Function   Level of Independence Independent    Vocation Retired    Leisure takes care of little dog  pt goes up an down stairs in her  house       Cognition   Overall Cognitive Status Within Functional Limits for tasks assessed      Observation/Other Assessments   Observations well healed scar in right lateral breast that appears to have swelling and fullness in breast above and below it.  skin on breast is darkened . Pt moves gingerly and appears to be in generalized pain     Other Surveys  Quick [place]ina Dung DASH  47.73      Observation/Other Assessments-Edema    Edema --   yes in right breast      Coordination   Gross Motor Movements are Fluid and Coordinated No   limited by generalized pain      Posture/Postural Control   Posture/Postural Control Postural limitations    Postural Limitations Rounded Shoulders;Forward head      AROM   Overall AROM  Deficits    Overall AROM Comments limited by pain in both shoulders     AROM Assessment Site Shoulder    Right/Left Shoulder Right;Left    Right Shoulder Flexion 125 Degrees    Right Shoulder ABduction 120 Degrees    Right Shoulder Internal Rotation 60 Degrees    Right Shoulder External Rotation 90 Degrees    Left Shoulder Flexion 150 Degrees    Left Shoulder ABduction 110 Degrees    Left Shoulder Internal Rotation 60 Degrees    Left Shoulder External Rotation 90 Degrees      Strength   Overall Strength Deficits    Overall Strength Comments limited to 3-/5 in both shoulders due to pain       Palpation   Palpation comment congestion felt in  right lateral breas  Special Tests    Special Tests Rotator Cuff Impingement    Rotator Cuff Impingment tests Neer impingement test;Painful Arc of Motion      Neer Impingement test    Findings Positive    Side Right      Painful Arc of Motion   Findings Positive    Side Right             LYMPHEDEMA/ONCOLOGY QUESTIONNAIRE - 12/20/19 0001      Type   Cancer Type right breast DCIS      Surgeries   Lumpectomy Date 07/29/19    Other Surgery Date 08/31/19   reexcision to get clear margins   Number Lymph Nodes Removed 0      Treatment   Active Chemotherapy Treatment No    Past Chemotherapy Treatment No    Active Radiation Treatment No    Past Radiation Treatment Yes    Date 11/02/19    Body Site right breast      What other symptoms do you have   Are you Having Heaviness or Tightness Yes    Are you having Pain Yes    Are you having pitting edema Yes    Body Site right breast     Is it Hard or Difficult finding clothes that fit No    Do you have infections No    Is there Decreased scar mobility Yes    [place] Sign No      Lymphedema Assessments   Lymphedema Assessments Upper extremities      Right Upper Extremity Lymphedema   10 cm Proximal to Olecranon Process 33 cm    Olecranon Process 26 cm    10 cm Proximal to Ulnar Styloid Process 23.5 cm    Just Proximal to Ulnar Styloid Process 16.5 cm    Across Hand at [employer] 19 cm    At [place] of 2nd Digit 7 cm      Left Upper Extremity Lymphedema   10 cm Proximal to Olecranon Process 32 cm    Olecranon Process 25.5 cm    10 cm Proximal to Ulnar Styloid Process 23 cm    Just Proximal to Ulnar Styloid Process 16 cm    Across Hand at [employer] 19.5 cm    At [place] of 2nd Digit 7 cm                 Quick Dash - 12/20/19 0001    Open a tight or new jar Moderate difficulty    Do heavy household chores (wash walls, wash floors) Moderate difficulty    Carry a shopping bag or briefcase Mild  difficulty    Wash your back Severe difficulty    Use a knife to cut food No difficulty    Recreational activities in which you take some force or impact through your arm, shoulder, or hand (golf, hammering, tennis) Moderate difficulty    During the past week, to what extent has your arm, shoulder or hand problem interfered with your normal social activities with family, friends, neighbors, or groups? Modererately    During the past week, to what extent has your arm, shoulder or hand problem limited your work or other regular daily activities Quite a bit    Arm, shoulder, or hand pain. Severe    Tingling (pins and needles) in your arm, shoulder, or hand Mild    Difficulty Sleeping Moderate difficulty    DASH Score 47.73 %  Objective measurements completed on examination: See above findings.       [facility] Adult PT Treatment/Exercise - 12/20/19 0001      Exercises   Exercises Shoulder      Shoulder Exercises: Supine   Flexion AROM;Both;5 reps   with dowel                  PT Education - 12/20/19 2008    Education Details supine dowel flexion    Person(s) Educated Patient    Methods Explanation;Demonstration;Handout    Comprehension Verbalized understanding;Returned demonstration               PT Long Term Goals - 12/20/19 2016      PT LONG TERM GOAL #1   Title Pt will report the pain and stiffness in her right shoudler are decreased by 50%    Time 6    Status New      PT LONG TERM GOAL #2   Title Pt will have > 150 degrees of painless right shoulder flexion so that she can perform her daily activities easier    Baseline 125 on 12/20/2019    Time 6    Period Weeks    Status New      PT LONG TERM GOAL #3   Title Pt will be independent in a HEP for shoulder ROM and strength    Time 6    Period Weeks    Status New      PT LONG TERM GOAL #4   Title Pt will report she knows how to manage 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 She also had pain and stiffness in her left side that she will have evaluated by her family doctor.  She will benefit from PT to help with the symptoms of her right shoulder and right breast in this episode.    Personal Factors and Comorbidities Age;Comorbidity 3+    Comorbidities right breast surgery x2, radiation, recent fall    Examination-Activity Limitations Reach Overhead;Lift    Examination-Participation Restrictions Meal Prep;Shop;Driving    Stability/Clinical Decision Making Stable/Uncomplicated    Clinical Decision Making Low    Rehab Potential Excellent    PT Frequency 2x / week    PT Duration 6 weeks   pt may not be able to attend all sessions   PT Treatment/Interventions ADLs/Self Care Home Management;Moist Heat;DME Instruction;Therapeutic activities;Therapeutic exercise;Patient/family education;Orthotic Fit/Training;Scar mobilization;Manual lymph drainage;Manual techniques;Passive range of motion;Taping    PT Next Visit Plan MLD to right breast as needed ? need compression bra?  P/AA/AROM to right shoulder and manua work as needed to decrase pain, progress strengthening to right shoulder    PT Home Exercise Plan supine dowel flexion    Consulted and Agree with Plan of Care Patient           Patient will benefit from skilled therapeutic intervention in order to improve the following deficits and impairments:  Decreased endurance, Decreased skin integrity, Increased muscle spasms, Impaired perceived functional ability, Increased edema, Decreased scar mobility, Decreased range of motion, Decreased cognition, Decreased activity tolerance, Decreased strength, Impaired UE functional use,  Postural dysfunction, Pain  Visit Diagnosis: Aftercare  following surgery for neoplasm - Plan: PT plan of care cert/re-cert  Disorder of the skin and subcutaneous tissue related to radiation, unspecified - Plan: PT plan of care cert/re-cert  Stiffness of right shoulder, not elsewhere classified - Plan: PT plan of care cert/re-cert  Acute pain of right shoulder - Plan: PT plan of care cert/re-cert  Muscle weakness (generalized) - Plan: PT plan of care cert/re-cert     Problem List Patient Active Problem List   Diagnosis Date Noted  . Genetic testing 07/05/2019  . Family history of ovarian cancer   . 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 Dysphagia, unspecified(787.20) 10/23/2010  . Abdominal pain, unspecified site 10/23/2010  . Stricture esophagus 10/23/2010  . Irritable bowel syndrome (IBS) 10/23/2010  . Constipation, chronic 10/23/2010  . Syncope 10/16/2010  . GERD (gastroesophageal reflux disease) 09/11/2010  . Chest pain, unspecified 09/11/2010  . ABSCESS 05/23/2010  . ACUTE CYSTITIS 05/31/2009  . SHOULDER PAIN, LEFT 05/31/2009  . POSTMENOPAUSAL STATUS 05/31/2009  . CONSTIPATION 11/03/2008  . CONTUSION, ARM 07/20/2008  . JOINT STIFFNESS, HAND 06/15/2008  . URINARY INCONTINENCE 06/15/2008  . VITAMIN D DEFICIENCY 03/20/2008  . ARTHRITIS 03/14/2008  . ANEMIA 03/08/2008  . Type 2 diabetes mellitus without complication, without long-term current use of insulin ([place]) 12/11/2006  . DIABETIC  RETINOPATHY 12/11/2006  . HLD (hyperlipidemia) 12/11/2006  . CATARACT, DIABETIC 12/11/2006  . Essential hypertension 12/11/2006  . Coronary artery disease involving native coronary artery of native heart without angina pectoris 12/11/2006  . FATIGUE 12/11/2006   Donato Heinz. Owens Shark PT   Norwood Levo 12/20/2019, 8:20 PM  [place], [place], [address] Phone: [phone]   Fax:  [phone]  Name: Evelyn Morrison MRN:  [id number] Date of Birth: [birth year]

## 2019-12-26 DIAGNOSIS — E559 Vitamin D deficiency, unspecified: Secondary | ICD-10-CM | POA: Diagnosis not present

## 2019-12-26 DIAGNOSIS — N183 Chronic kidney disease, stage 3 unspecified: Secondary | ICD-10-CM | POA: Diagnosis not present

## 2019-12-26 DIAGNOSIS — I129 Hypertensive chronic kidney disease with stage 1 through stage 4 chronic kidney disease, or unspecified chronic kidney disease: Secondary | ICD-10-CM | POA: Diagnosis not present

## 2019-12-26 DIAGNOSIS — C50911 Malignant neoplasm of unspecified site of right female breast: Secondary | ICD-10-CM | POA: Diagnosis not present

## 2019-12-26 DIAGNOSIS — D631 Anemia in chronic kidney disease: Secondary | ICD-10-CM | POA: Diagnosis not present

## 2019-12-29 DIAGNOSIS — I10 Essential (primary) hypertension: Secondary | ICD-10-CM | POA: Diagnosis not present

## 2019-12-29 DIAGNOSIS — I25118 Atherosclerotic heart disease of native coronary artery with other forms of angina pectoris: Secondary | ICD-10-CM | POA: Diagnosis not present

## 2019-12-29 DIAGNOSIS — G629 Polyneuropathy, unspecified: Secondary | ICD-10-CM | POA: Diagnosis not present

## 2019-12-29 DIAGNOSIS — E785 Hyperlipidemia, unspecified: Secondary | ICD-10-CM | POA: Diagnosis not present

## 2019-12-29 DIAGNOSIS — Z23 Encounter for immunization: Secondary | ICD-10-CM | POA: Diagnosis not present

## 2019-12-29 DIAGNOSIS — E119 Type 2 diabetes mellitus without complications: Secondary | ICD-10-CM | POA: Diagnosis not present

## 2020-01-02 ENCOUNTER — Inpatient Hospital Stay: Payer: Medicare HMO | Attending: Hematology | Admitting: Nurse Practitioner

## 2020-01-03 ENCOUNTER — Ambulatory Visit: Payer: Medicare HMO | Admitting: Rehabilitation

## 2020-01-03 ENCOUNTER — Other Ambulatory Visit: Payer: Self-pay

## 2020-01-05 ENCOUNTER — Encounter: Payer: Medicare HMO | Admitting: Physical Therapy

## 2020-01-11 ENCOUNTER — Encounter: Payer: Self-pay | Admitting: Physical Therapy

## 2020-01-11 ENCOUNTER — Ambulatory Visit: Payer: Medicare HMO | Attending: Hematology | Admitting: Physical Therapy

## 2020-01-11 ENCOUNTER — Other Ambulatory Visit: Payer: Self-pay

## 2020-01-11 DIAGNOSIS — M25611 Stiffness of right shoulder, not elsewhere classified: Secondary | ICD-10-CM | POA: Diagnosis not present

## 2020-01-11 DIAGNOSIS — Z483 Aftercare following surgery for neoplasm: Secondary | ICD-10-CM

## 2020-01-11 DIAGNOSIS — L599 Disorder of the skin and subcutaneous tissue related to radiation, unspecified: Secondary | ICD-10-CM | POA: Diagnosis not present

## 2020-01-11 DIAGNOSIS — M25511 Pain in right shoulder: Secondary | ICD-10-CM | POA: Diagnosis not present

## 2020-01-11 DIAGNOSIS — M6281 Muscle weakness (generalized): Secondary | ICD-10-CM | POA: Insufficient documentation

## 2020-01-11 NOTE — Patient Instructions (Signed)
First of all, check with your insurance company to see if provider is in [place] (for wigs and compression sleeves / gloves/gauntlets )  [place], [place] [address] [phone]  Will file some insurances --- call for appointment   Second to [facility] (for mastectomy prosthetics and garments) [place], [place] [address] [phone] Will file some insurances --- call for appointment  [facility]  [address]  [place], [place] [address] [phone] Lower extremity garments  [facility] [place], [place]  [address] [place] ( Medicaid certified lymphedema fitter) [phone] [email or web address]  [place]  [address], [place] [address] [phone]  Other Resources: National Lymphedema Network:  www.lymphnet.org www.Klosetraining.com for patient articles and self manual lymph drainage information www.lymphedemablog.com has informative articles.  DishTag.es.com www.lymphedemaproducts.com www.brightlifedirect.com https://holt.org/   Over Head Pull: Narrow and Wide Grip   Cancer Rehab [phone]   On back, knees bent, feet flat, band across thighs, elbows straight but relaxed. Pull hands apart (start). Keeping elbows straight, bring arms up and over head, hands toward floor. Keep pull steady on band. Hold momentarily. Return slowly, keeping pull steady, back to start. Then do same with a wider grip on the band (past shoulder width) Repeat _5-10__ times. Band color __yellow____   Side Pull: Double Arm   On back, knees bent, feet flat. Arms perpendicular to body, shoulder level, elbows straight but relaxed. Pull arms out to sides, elbows straight. Resistance band comes across collarbones, hands toward floor. Hold momentarily. Slowly return to starting  position. Repeat _5-10__ times. Band color _yellow____   Sword   On back, knees bent, feet flat, left hand on left hip, right hand above left. Pull right arm DIAGONALLY (hip to shoulder) across chest. Bring right arm along head toward floor. Hold momentarily. Slowly return to starting position. Repeat _5-10__ times. Do with left arm. Band color _yellow_____   Shoulder Rotation: Double Arm   On back, knees bent, feet flat, elbows tucked at sides, bent 90, hands palms up. Pull hands apart and down toward floor, keeping elbows near sides. Hold momentarily. Slowly return to starting position. Repeat _5-10__ times. Band color __yellow____

## 2020-01-11 NOTE — Therapy (Signed)
[place], [place], [address] Phone: [phone]   Fax:  [phone]  Physical Therapy Treatment  Patient Details  Name: Evelyn Morrison MRN: [id number] Date of Birth: May 05, 1940 Referring Provider (PT): Dr. Burr Medico   Encounter Date: 01/11/2020   PT End of Session - 01/11/20 1359    Visit Number 2    Number of Visits 9    Date for PT Re-Evaluation 01/27/20    PT Start Time 1300    PT Stop Time 1345    PT Time Calculation (min) 45 min           Past Medical History:  Diagnosis Date  . Anemia   . Anxiety   . Arthritis   . CAD (coronary artery disease)    Myoview 12/17: EF 66, apical and apical lateral defect - likely attenuation artifact, no ischemia; Low Risk // Myoview 3/19:  EF 74, no ischemia or scar; low risk   . Cataract, diabetic ([place])   . Chest pain   . Chronic kidney disease    stage 3  . Constipation   . Contusion of arm   . Diabetes mellitus   . Diabetic retinopathy   . DM (diabetes mellitus) ([place])   . Dyspnea    SOb on exertion  . Esophageal stricture   . Family history of ovarian cancer   . Fatigue   . Glaucoma   . Heart burn   . Hiatal hernia   . History of echocardiogram    Echo 3/19:  Mild LVH, EF 60-65, no RWMA, Gr 1 DD, PASP 34  . HLD (hyperlipidemia)   . Hypertension   . Joint stiffness of hand   . Myocardial infarction ([place])    2004, stent  . Urinary incontinence   . Vitamin D deficiency     Past Surgical History:  Procedure Laterality Date  . adenosine cardillite  05/16/05   negative signif. myocardial ischemic  . BREAST LUMPECTOMY WITH RADIOACTIVE SEED LOCALIZATION Right 07/29/2019   Procedure: RIGHT BREAST LUMPECTOMY WITH RADIOACTIVE SEED LOCALIZATION;  Surgeon: [name], MD;  Location: [facility];  Service: General;  Laterality: Right;  . CARDIAC CATHETERIZATION  10/04   s/p stent LV 45%  . CATARACT EXTRACTION     bilateral  .  CESAREAN SECTION  84, 86   ectopic pregnancy  . CHOLECYSTECTOMY  1998  . CORONARY ANGIOPLASTY WITH STENT PLACEMENT  10/04  . PARTIAL HYSTERECTOMY    . RE-EXCISION OF BREAST LUMPECTOMY Right 08/31/2019   Procedure: RE-EXCISION OF RIGHT BREAST LUMPECTOMY;  Surgeon: [name], MD;  Location: [facility];  Service: General;  Laterality: Right;    There were no vitals filed for this visit.   Subjective Assessment - 01/11/20 1314    Subjective Pt states that her shoulder and breast fullness is better, but she still thinks her right breast is bigger. She is interested in getting a compression bra.  Pt states she has been using her arm at home alot    Pertinent History Right breast cancer diagnosed 06/08/2019 with lumpectomy 07/29/2019 and reexcision on 08/31/2019, Pt did not have chemo but she had radiatiion from 10/06/2019 to 11/02/2019 Pt states history inclused kidney problems that she will have tested soon    Patient Stated Goals to get right shoulder back to normal    Currently in Pain? No/denies              [facility] PT Assessment - 01/11/20 0001  AROM   Right Shoulder Flexion 160 Degrees    Right Shoulder ABduction 150 Degrees    Right Shoulder External Rotation 90 Degrees                         [facility] Adult PT Treatment/Exercise - 01/11/20 0001      Self-Care   Self-Care Other Self-Care Comments    Other Self-Care Comments  pt instructed where to go to get a compression bra. Inbasket sent to Bary Castilla to send a script to [facility] [employer] [facility]      Shoulder Exercises: Supine   Horizontal ABduction Strengthening;Right;Left;5 reps;Theraband    Theraband Level (Shoulder Horizontal ABduction) Level 1 (Yellow)    External Rotation Strengthening;Right;Left;5 reps;Theraband    Theraband Level (Shoulder External Rotation) Level 1 (Yellow)    Flexion Strengthening;Right;Left;5 reps;Theraband   narrow and wide grip    Theraband Level (Shoulder  Flexion) Level 1 (Yellow)    Diagonals Strengthening;Right;Left;5 reps;Theraband    Theraband Level (Shoulder Diagonals) Level 1 (Yellow)      Manual Therapy   Manual Therapy Edema management    Manual therapy comments instructed in self skin stretch to lateral breast around scar in left sidelying with arm overhead.  Used mirror for visual feedback to pt so that she can get a stretch around the scar.                   PT Education - 01/11/20 1357    Education Details supine scapular series, where to get a compression bra    Person(s) Educated Patient    Methods Explanation;Handout;Demonstration    Comprehension Verbalized understanding;Returned demonstration               PT Long Term Goals - 01/11/20 1313      PT LONG TERM GOAL #1   Title Pt will report the pain and stiffness in her right shoudler are decreased by 50%    Status Achieved      PT LONG TERM GOAL #2   Title Pt will have > 150 degrees of painless right shoulder flexion so that she can perform her daily activities easier    Baseline 125 on 12/20/2019, 160 on 01/11/2020    Status Achieved      PT LONG TERM GOAL #3   Title Pt will be independent in a HEP for shoulder ROM and strength    Time 6    Period Weeks    Status On-going      PT LONG TERM GOAL #4   Title Pt will report she knows how to manage the 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  She was instructed in shoulder strengthening and was given information to get a compression bra to help with lymphedema managment of right breast although that appears to be much improved also. Since pt has difficutly with transportation, cancelled appointment till Oct 28 in the hopes that she will have her compression bra by then.  She will continue to do self care at home as instructed today   until then    Personal Factors and Comorbidities Age;Comorbidity 3+  Comorbidities right breast surgery x2, radiation, recent fall    Examination-Activity Limitations Reach Overhead;Lift    Examination-Participation Restrictions Meal Prep;Shop;Driving    Stability/Clinical Decision Making Stable/Uncomplicated    Rehab Potential Excellent    PT Frequency 2x / week    PT Duration 6 weeks    PT Treatment/Interventions ADLs/Self Care Home Management;Moist Heat;DME Instruction;Therapeutic activities;Therapeutic exercise;Patient/family education;Orthotic Fit/Training;Scar mobilization;Manual lymph drainage;Manual techniques;Passive range of motion;Taping    PT Next Visit Plan reassess and determine if pt needs to keep the rest of appointments ( will need renewal) or can discharge.    PT Home Exercise Plan supine dowel flexion, supine scapular series    Consulted and Agree with Plan of Care Patient           Patient will benefit from skilled therapeutic intervention in order to improve the following deficits and impairments:  Decreased endurance, Decreased skin integrity, Increased muscle spasms, Impaired perceived functional ability, Increased edema, Decreased scar mobility, Decreased range of motion, Decreased cognition, Decreased activity tolerance, Decreased strength, Impaired UE functional use, Postural dysfunction, Pain  Visit Diagnosis: Aftercare following surgery for neoplasm  Disorder of the skin and subcutaneous tissue related to radiation, unspecified  Stiffness of right shoulder, not elsewhere classified  Acute pain of right shoulder  Muscle weakness (generalized)     Problem List Patient Active Problem List   Diagnosis Date Noted  . Genetic testing 07/05/2019  . Family history of ovarian cancer   . 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 Dysphagia, unspecified(787.20) 10/23/2010  . Abdominal pain, unspecified site 10/23/2010  . Stricture  esophagus 10/23/2010  . Irritable bowel syndrome (IBS) 10/23/2010  . Constipation, chronic 10/23/2010  . Syncope 10/16/2010  . GERD (gastroesophageal reflux disease) 09/11/2010  . Chest pain, unspecified 09/11/2010  . ABSCESS 05/23/2010  . ACUTE CYSTITIS 05/31/2009  . SHOULDER PAIN, LEFT 05/31/2009  . POSTMENOPAUSAL STATUS 05/31/2009  . CONSTIPATION 11/03/2008  . CONTUSION, ARM 07/20/2008  . JOINT STIFFNESS, HAND 06/15/2008  . URINARY INCONTINENCE 06/15/2008  . VITAMIN D DEFICIENCY 03/20/2008  . ARTHRITIS 03/14/2008  . ANEMIA 03/08/2008  . Type 2 diabetes mellitus without complication, without long-term current use of insulin ([place]) 12/11/2006  . DIABETIC  RETINOPATHY 12/11/2006  . HLD (hyperlipidemia) 12/11/2006  . CATARACT, DIABETIC 12/11/2006  . Essential hypertension 12/11/2006  . Coronary artery disease involving native coronary artery of native heart without angina pectoris 12/11/2006  . FATIGUE 12/11/2006   Donato Heinz. Owens Shark PT  Norwood Levo 01/11/2020, 2:08 PM  [place], [place], [address] Phone: [phone]   Fax:  [phone]  Name: Josely Moffat MRN: [id number] Date of Birth: 07/21/1940

## 2020-01-13 ENCOUNTER — Encounter: Payer: Medicare HMO | Admitting: Rehabilitation

## 2020-01-17 ENCOUNTER — Encounter: Payer: Medicare HMO | Admitting: Physical Therapy

## 2020-01-19 ENCOUNTER — Encounter: Payer: Medicare HMO | Admitting: Rehabilitation

## 2020-01-19 DIAGNOSIS — C50911 Malignant neoplasm of unspecified site of right female breast: Secondary | ICD-10-CM | POA: Diagnosis not present

## 2020-01-20 ENCOUNTER — Encounter: Payer: Self-pay | Admitting: Cardiovascular Disease

## 2020-01-20 ENCOUNTER — Ambulatory Visit (INDEPENDENT_AMBULATORY_CARE_PROVIDER_SITE_OTHER): Payer: Medicare HMO | Admitting: Cardiovascular Disease

## 2020-01-20 ENCOUNTER — Other Ambulatory Visit: Payer: Self-pay

## 2020-01-20 VITALS — BP 114/68 | HR 90 | Ht 61.0 in | Wt 152.8 lb

## 2020-01-20 DIAGNOSIS — I10 Essential (primary) hypertension: Secondary | ICD-10-CM

## 2020-01-20 DIAGNOSIS — I25118 Atherosclerotic heart disease of native coronary artery with other forms of angina pectoris: Secondary | ICD-10-CM | POA: Diagnosis not present

## 2020-01-20 DIAGNOSIS — E782 Mixed hyperlipidemia: Secondary | ICD-10-CM

## 2020-01-20 DIAGNOSIS — E119 Type 2 diabetes mellitus without complications: Secondary | ICD-10-CM

## 2020-01-20 NOTE — Patient Instructions (Signed)

## 2020-01-20 NOTE — Progress Notes (Signed)
Cardiology Office Note:    Date:  01/20/2020   ID:  Nichol Ator, DOB 25-Dec-1940, MRN [id number]  PCP:  Benito Mccreedy, MD  [facility] Cardiologist:  Sherren Mocha, MD  [place] Electrophysiologist:  None   Referring MD: Benito Mccreedy, MD   Chief Complaint  Patient presents with  . Coronary Artery Disease   History of Present Illness:    Evelyn Morrison is a 79 y.o. female with a history of coronary artery disease, status post remote MI.  She underwent PCI at the time of her infarct in 2004, treated at another health system.  She has had multiple stress tests over the years, most recently in 2019 when a Lexiscan Myoview showed no ischemia or infarction with normal LVEF.  The patient is here alone today.  She has recently completed radiation therapy for ductal carcinoma in situ of the right breast.  She is very pleased to be finished with her treatment.  She has not had any cardiac related complaints.  She specifically denies chest pain, chest pressure, shortness of breath, heart palpitations, lightheadedness, or leg swelling.  She reports no recent changes in her medications.  Past Medical History:  Diagnosis Date  . Anemia   . Anxiety   . Arthritis   . CAD (coronary artery disease)    Myoview 12/17: EF 66, apical and apical lateral defect - likely attenuation artifact, no ischemia; Low Risk // Myoview 3/19:  EF 74, no ischemia or scar; low risk   . Cataract, diabetic ([place])   . Chest pain   . Chronic kidney disease    stage 3  . Constipation   . Contusion of arm   . Diabetes mellitus   . Diabetic retinopathy   . DM (diabetes mellitus) ([place])   . Dyspnea    SOb on exertion  . Esophageal stricture   . Family history of ovarian cancer   . Fatigue   . Glaucoma   . Heart burn   . Hiatal hernia   . History of echocardiogram    Echo 3/19:  Mild LVH, EF 60-65, no RWMA, Gr 1 DD, PASP 34  . HLD (hyperlipidemia)   . Hypertension   . Joint stiffness of  hand   . Myocardial infarction ([place])    2004, stent  . Urinary incontinence   . Vitamin D deficiency     Past Surgical History:  Procedure Laterality Date  . adenosine cardillite  05/16/05   negative signif. myocardial ischemic  . BREAST LUMPECTOMY WITH RADIOACTIVE SEED LOCALIZATION Right 07/29/2019   Procedure: RIGHT BREAST LUMPECTOMY WITH RADIOACTIVE SEED LOCALIZATION;  Surgeon: [name], MD;  Location: [facility];  Service: General;  Laterality: Right;  . CARDIAC CATHETERIZATION  10/04   s/p stent LV 45%  . CATARACT EXTRACTION     bilateral  . CESAREAN SECTION  84, 86   ectopic pregnancy  . CHOLECYSTECTOMY  1998  . CORONARY ANGIOPLASTY WITH STENT PLACEMENT  10/04  . PARTIAL HYSTERECTOMY    . RE-EXCISION OF BREAST LUMPECTOMY Right 08/31/2019   Procedure: RE-EXCISION OF RIGHT BREAST LUMPECTOMY;  Surgeon: [name], MD;  Location: [facility];  Service: General;  Laterality: Right;    Current Medications: Current Meds  Medication Sig  . Accu-Chek FastClix Lancets MISC 1 each by Other route 3 (three) times daily.  Marland Kitchen ACCU-CHEK GUIDE test strip 1 each by Other route as needed.  Marland Kitchen acetaminophen (TYLENOL) 500 MG tablet Take 500-1,000 mg by mouth every 6 (six) hours  as needed for moderate pain.   Marland Kitchen albuterol (PROVENTIL HFA;VENTOLIN HFA) 108 (90 Base) MCG/ACT inhaler Inhale 1-2 puffs into the lungs every 4 (four) hours as needed for wheezing or shortness of breath.  Marland Kitchen aspirin 81 MG EC tablet Take 81 mg by mouth every morning.   . Cholecalciferol (D3 HIGH POTENCY) 50 MCG (2000 UT) CAPS Take 2,000 Units by mouth daily.   . ferrous sulfate 325 (65 FE) MG tablet Take 325 mg by mouth daily with breakfast.  . gabapentin (NEURONTIN) 300 MG capsule Take 300 mg by mouth 2 (two) times daily.  Marland Kitchen lisinopril-hydrochlorothiazide (PRINZIDE,ZESTORETIC) 20-12.5 MG tablet Take 1 tablet by mouth daily.  Marland Kitchen lovastatin (MEVACOR) 10 MG tablet Take 10 mg by mouth at bedtime.  Marland Kitchen MYRBETRIQ 25 MG  TB24 tablet Take 25 mg by mouth daily.   . nitroGLYCERIN (NITROSTAT) 0.4 MG SL tablet Place 0.4 mg under the tongue every 5 (five) minutes as needed for chest pain (3 doses max).   . TURMERIC PO Take 1 capsule by mouth daily as needed (arthritis pain).  . [DISCONTINUED] amLODipine (NORVASC) 10 MG tablet Take 10 mg by mouth daily.  . [DISCONTINUED] glipiZIDE (GLUCOTROL) 5 MG tablet Take 0.5 tablets (2.5 mg total) by mouth daily.  . [DISCONTINUED] oxyCODONE (OXY IR/ROXICODONE) 5 MG immediate release tablet Take 0.5-1 tablets (2.5-5 mg total) by mouth every 6 (six) hours as needed for severe pain.     Allergies:   Penicillins, Ibuprofen, Lactose intolerance (gi), Milk-related compounds, Nyquil multi-symptom [pseudoeph-doxylamine-dm-apap], and Latex   Social History   Socioeconomic History  . Marital status: Married    Spouse name: Not on file  . Number of children: 5   . Years of education: Not on file  . Highest education level: Not on file  Occupational History  . Occupation: Education officer, museum    Comment: Retired  Tobacco Use  . Smoking status: Former Smoker    Packs/day: 0.25    Years: 15.00    Pack years: 3.75    Quit date: 03/31/1990    Years since quitting: 29.8  . Smokeless tobacco: Never Used  Substance and Sexual Activity  . Alcohol use: No    Comment: ocassional  . Drug use: No  . Sexual activity: Not on file  Other Topics Concern  . Not on file  Social History Narrative   2 caffeine drinks daily    Social Determinants of Health   Financial Resource Strain:   . Difficulty of Paying Living Expenses: Not on file  Food Insecurity:   . Worried About Charity fundraiser in the Last Year: Not on file  . Ran Out of Food in the Last Year: Not on file  Transportation Needs:   . Lack of Transportation (Medical): Not on file  . Lack of Transportation (Non-Medical): Not on file  Physical Activity:   . Days of Exercise per Week: Not on file  . Minutes of Exercise per Session:  Not on file  Stress:   . Feeling of Stress : Not on file  Social Connections:   . Frequency of Communication with Friends and Family: Not on file  . Frequency of Social Gatherings with Friends and Family: Not on file  . Attends Religious Services: Not on file  . Active Member of Clubs or Organizations: Not on file  . Attends Archivist Meetings: Not on file  . Marital Status: Not on file     Family History: The patient's family history includes Crohn's  disease in her brother, brother, and brother; Diabetes in an other family member; Heart attack in her father; Heart disease in her mother; Ovarian cancer (age of onset: 13) in her cousin. There is no history of Colon cancer.  ROS:   Please see the history of present illness.    All other systems reviewed and are negative.  EKGs/Labs/Other Studies Reviewed:    EKG:  EKG is not ordered today.   Recent Labs: 06/22/2019: ALT 18 08/25/2019: BUN 18; Creatinine, Ser 1.31; Hemoglobin 11.7; Platelets 195; Potassium 3.7; Sodium 141  Recent Lipid Panel    Component Value Date/Time   CHOL 193 04/24/2017 1139   TRIG 56 04/24/2017 1139   HDL 86 04/24/2017 1139   CHOLHDL 2.2 04/24/2017 1139   CHOLHDL 2.3 03/10/2016 1008   VLDL 25 03/10/2016 1008   LDLCALC 96 04/24/2017 1139     Risk Assessment/Calculations:      Physical Exam:    VS:  BP 114/68   Pulse 90   Ht 5\' 1"  (1.549 m)   Wt 152 lb 12.8 oz (69.3 kg)   SpO2 97%   BMI 28.87 kg/m     Wt Readings from Last 3 Encounters:  01/20/20 152 lb 12.8 oz (69.3 kg)  12/08/19 155 lb 3.2 oz (70.4 kg)  10/31/19 154 lb 9.6 oz (70.1 kg)     GEN:  Well nourished, well developed in no acute distress HEENT: Normal NECK: No JVD; No carotid bruits LYMPHATICS: No lymphadenopathy CARDIAC: RRR, no murmurs, rubs, gallops RESPIRATORY:  Clear to auscultation without rales, wheezing or rhonchi  ABDOMEN: Soft, non-tender, non-distended MUSCULOSKELETAL:  No edema; No deformity  SKIN:  Warm and dry NEUROLOGIC:  Alert and oriented x 3 PSYCHIATRIC:  Normal affect   ASSESSMENT:    1. Coronary artery disease of native artery of native heart with stable angina pectoris ([place])   2. Type 2 diabetes mellitus without complication, without long-term current use of insulin ([place])   3. Essential (primary) hypertension   4. Mixed hyperlipidemia    PLAN:    In order of problems listed above:  1. Stable without symptoms of angina.  Continue aspirin for antiplatelet therapy.  Patient treated with an ACE inhibitor in the setting of type 2 diabetes.  She is treated with a statin drug. 2. Followed by her primary physician.  Recent hemoglobin A1c 6.8. 3. Blood pressure well controlled on lisinopril/hydrochlorothiazide.  Amlodipine has been discontinued.  Creatinine 1.3 mg/dL, potassium 3.7. 4. Treated with lovastatin.  Managed by her PCP.   Medication Adjustments/Labs and Tests Ordered: Current medicines are reviewed at length with the patient today.  Concerns regarding medicines are outlined above.  No orders of the defined types were placed in this encounter.  No orders of the defined types were placed in this encounter.   Patient Instructions  Medication Instructions:  Your provider recommends that you continue on your current medications as directed. Please refer to the Current Medication list given to you today.   *If you need a refill on your cardiac medications before your next appointment, please call your pharmacy*   Follow-Up: At [facility] you and your health needs are our priority.  As part of our continuing mission to provide you with exceptional heart care, we have created designated Provider Care Teams.  These Care Teams include your primary Cardiologist (physician) and Advanced Practice Providers (APPs -  Physician Assistants and Nurse Practitioners) who all work together to provide you with the care you need, when  you need it. Your next appointment:   12  month(s) The format for your next appointment:   In Person Provider:   You may see Sherren Mocha, MD or one of the following Advanced Practice Providers on your designated Care Team:    Richardson Dopp, PA-C  Robbie Lis, [place]      Signed, Sherren Mocha, MD  01/20/2020 4:53 PM    [place]

## 2020-01-24 ENCOUNTER — Encounter: Payer: Medicare HMO | Admitting: Rehabilitation

## 2020-01-26 ENCOUNTER — Encounter: Payer: Self-pay | Admitting: Physical Therapy

## 2020-01-26 ENCOUNTER — Ambulatory Visit: Payer: Medicare HMO | Admitting: Physical Therapy

## 2020-01-26 ENCOUNTER — Other Ambulatory Visit: Payer: Self-pay

## 2020-01-26 DIAGNOSIS — Z483 Aftercare following surgery for neoplasm: Secondary | ICD-10-CM

## 2020-01-26 DIAGNOSIS — M25611 Stiffness of right shoulder, not elsewhere classified: Secondary | ICD-10-CM | POA: Diagnosis not present

## 2020-01-26 DIAGNOSIS — M25511 Pain in right shoulder: Secondary | ICD-10-CM

## 2020-01-26 DIAGNOSIS — M6281 Muscle weakness (generalized): Secondary | ICD-10-CM

## 2020-01-26 DIAGNOSIS — L599 Disorder of the skin and subcutaneous tissue related to radiation, unspecified: Secondary | ICD-10-CM | POA: Diagnosis not present

## 2020-01-26 NOTE — Therapy (Signed)
Evelyn Morrison, [place], [address] Phone: [phone]   Fax:  [phone]  Physical Therapy Treatment  Patient Details  Name: Evelyn Morrison MRN: [id number] Date of Birth: 11-09-1940 Referring Provider (PT): Dr. Burr Medico   Encounter Date: 01/26/2020   PT End of Session - 01/26/20 1559    Visit Number 3    Number of Visits 9    Date for PT Re-Evaluation 01/27/20    PT Start Time 3267    PT Stop Time 1245    PT Time Calculation (min) 40 min    Activity Tolerance Patient tolerated treatment well    Behavior During Therapy [facility] for tasks assessed/performed           Past Medical History:  Diagnosis Date   Anemia    Anxiety    Arthritis    CAD (coronary artery disease)    Myoview 12/17: EF 66, apical and apical lateral defect - likely attenuation artifact, no ischemia; Low Risk // Myoview 3/19:  EF 74, no ischemia or scar; low risk    Cataract, diabetic (HCC)    Chest pain    Chronic kidney disease    stage 3   Constipation    Contusion of arm    Diabetes mellitus    Diabetic retinopathy    DM (diabetes mellitus) ([place])    Dyspnea    SOb on exertion   Esophageal stricture    Family history of ovarian cancer    Fatigue    Glaucoma    Heart burn    Hiatal hernia    History of echocardiogram    Echo 3/19:  Mild LVH, EF 60-65, no RWMA, Gr 1 DD, PASP 34   HLD (hyperlipidemia)    Hypertension    Joint stiffness of hand    Myocardial infarction ([place])    2004, stent   Urinary incontinence    Vitamin D deficiency     Past Surgical History:  Procedure Laterality Date   adenosine cardillite  05/16/05   negative signif. myocardial ischemic   BREAST LUMPECTOMY WITH RADIOACTIVE SEED LOCALIZATION Right 07/29/2019   Procedure: RIGHT BREAST LUMPECTOMY WITH RADIOACTIVE SEED LOCALIZATION;  Surgeon: [name], MD;  Location: [facility];  Service: General;   Laterality: Right;   CARDIAC CATHETERIZATION  10/04   s/p stent LV 45%   CATARACT EXTRACTION     bilateral   CESAREAN SECTION  84, 86   ectopic pregnancy   CHOLECYSTECTOMY  1998   CORONARY ANGIOPLASTY WITH STENT PLACEMENT  10/04   PARTIAL HYSTERECTOMY     RE-EXCISION OF BREAST LUMPECTOMY Right 08/31/2019   Procedure: RE-EXCISION OF RIGHT BREAST LUMPECTOMY;  Surgeon: [name], MD;  Location: [facility];  Service: General;  Laterality: Right;    There were no vitals filed for this visit.   Subjective Assessment - 01/26/20 1513    Subjective Pt states that she got she her compression bras and they feel good. She has been wearing them every day. She knows that once her breast is reduced she will return to get more regular bras. She has had some family come to visit and she was able to help them and she also had a trip to [facility].  She has not have been having any pain in her shoulder. She feels like she is ready to discharge from PT today    Pertinent History Right breast cancer diagnosed 06/08/2019 with lumpectomy 07/29/2019 and reexcision on 08/31/2019, Pt did  not have chemo but she had radiatiion from 10/06/2019 to 11/02/2019 Pt states history inclused kidney problems that she will have tested soon    Patient Stated Goals to get right shoulder back to normal    Currently in Pain? No/denies                             [facility] Adult PT Treatment/Exercise - 01/26/20 0001      Shoulder Exercises: Supine   Flexion AROM;Right;Left;5 reps    Other Supine Exercises small circles with hand pointed to ceiling       Shoulder Exercises: Sidelying   ABduction AROM;Right;5 reps      Manual Therapy   Manual Therapy Soft tissue mobilization;Manual Lymphatic Drainage (MLD)    Manual therapy comments reviewed self MLD skin stretching to right lateral breast with pt in sidleying and supine. She reports she will be able to do it at home     Soft tissue mobilization with coconut oil to  tightness at pec major and posteior shoulder. Pt reports she felt much better after treatement                        PT Long Term Goals - 01/26/20 1602      PT LONG TERM GOAL #1   Title Pt will report the pain and stiffness in her right shoudler are decreased by 50%    Status Achieved      PT LONG TERM GOAL #2   Title Pt will have > 150 degrees of painless right shoulder flexion so that she can perform her daily activities easier    Baseline 125 on 12/20/2019, 160 on 01/11/2020    Status Achieved      PT LONG TERM GOAL #3   Title Pt will be independent in a HEP for shoulder ROM and strength    Status Achieved      PT LONG TERM GOAL #4   Title Pt will report she knows how to manage 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  She reports she will do the shoulder exercises she has been instructed in and will follow up with wearing the compression bras and getting more bras as needed    Personal Factors and Comorbidities Age;Comorbidity 3+    Comorbidities right breast surgery x2, radiation, recent fall    Examination-Activity Limitations Reach Overhead;Lift    Examination-Participation Restrictions Meal Prep;Shop;Driving    PT Treatment/Interventions ADLs/Self Care Home Management;Moist Heat;DME Instruction;Therapeutic activities;Therapeutic exercise;Patient/family education;Orthotic Fit/Training;Scar mobilization;Manual lymph drainage;Manual techniques;Passive range of motion;Taping    PT Next Visit Plan discharge this episode    Consulted and Agree with Plan of Care Patient           Patient will benefit from skilled therapeutic intervention in order to improve the following deficits and impairments:  Decreased endurance, Decreased skin integrity,  Increased muscle spasms, Impaired perceived functional ability, Increased edema, Decreased scar mobility, Decreased range of motion, Decreased cognition, Decreased activity tolerance, Decreased strength, Impaired UE functional use, Postural dysfunction, Pain  Visit Diagnosis: Disorder of the skin and subcutaneous tissue related to radiation,  unspecified  Aftercare following surgery for neoplasm  Stiffness of right shoulder, not elsewhere classified  Acute pain of right shoulder  Muscle weakness (generalized)     Problem List Patient Active Problem List   Diagnosis Date Noted   Genetic testing 07/05/2019   Family history of ovarian cancer    Ductal carcinoma in situ (DCIS) of right breast 06/14/2019   Dysphagia, unspecified(787.20) 10/23/2010   Abdominal pain, unspecified site 10/23/2010   Stricture esophagus 10/23/2010   Irritable bowel syndrome (IBS) 10/23/2010   Constipation, chronic 10/23/2010   Syncope 10/16/2010   GERD (gastroesophageal reflux disease) 09/11/2010   Chest pain, unspecified 09/11/2010   ABSCESS 05/23/2010   ACUTE CYSTITIS 05/31/2009   SHOULDER PAIN, LEFT 05/31/2009   POSTMENOPAUSAL STATUS 05/31/2009   CONSTIPATION 11/03/2008   CONTUSION, ARM 07/20/2008   JOINT STIFFNESS, HAND 06/15/2008   URINARY INCONTINENCE 06/15/2008   VITAMIN D DEFICIENCY 03/20/2008   ARTHRITIS 03/14/2008   ANEMIA 03/08/2008   Type 2 diabetes mellitus without complication, without long-term current use of insulin ([place]) 12/11/2006   DIABETIC  RETINOPATHY 12/11/2006   HLD (hyperlipidemia) 12/11/2006   CATARACT, DIABETIC 12/11/2006   Essential hypertension 12/11/2006   Coronary artery disease involving native coronary artery of native heart without angina pectoris 12/11/2006   FATIGUE 12/11/2006    PHYSICAL THERAPY DISCHARGE SUMMARY  Visits from Start of Care: 3  Current functional level related to goals / functional outcomes: independent    Remaining deficits: Mild fullness in right breast and tightness in right pec major    Education / Equipment: Self manual lymph drainage and home exercise program   Plan: Patient agrees to discharge.  Patient goals were met. Patient is being discharged due to being pleased with the current functional level.  ?????          Maudry Diego, PT 01/26/20 5:28 PM  Norwood Levo 01/26/2020, 5:27 PM  [place], [place], [address] Phone: [phone]   Fax:  [phone]  Name: Tarini Carrier MRN: [id number] Date of Birth: 1941/02/22

## 2020-01-31 ENCOUNTER — Ambulatory Visit: Payer: Medicare HMO | Admitting: Rehabilitation

## 2020-02-02 ENCOUNTER — Encounter: Payer: Medicare HMO | Admitting: Rehabilitation

## 2020-02-27 ENCOUNTER — Encounter: Payer: Self-pay | Admitting: Nurse Practitioner

## 2020-02-27 ENCOUNTER — Inpatient Hospital Stay: Payer: Medicare HMO | Attending: Hematology | Admitting: Nurse Practitioner

## 2020-02-27 DIAGNOSIS — D0511 Intraductal carcinoma in situ of right breast: Secondary | ICD-10-CM | POA: Diagnosis not present

## 2020-02-27 NOTE — Progress Notes (Signed)
CLINIC: Survivorship  Patient Care Team: Benito Mccreedy, MD as PCP - General (Internal Medicine) Sherren Mocha, MD as PCP - Cardiology (Cardiology) Mauro Kaufmann, RN as Oncology Nurse Navigator Rockwell Germany, RN as Oncology Nurse Navigator [name], MD as Consulting Physician (General Surgery) Truitt Merle, MD as Consulting Physician (Hematology) Gery Pray, MD as Consulting Physician (Radiation Oncology) Alla Feeling, NP as Nurse Practitioner (Nurse Practitioner)   I connected with Evelyn Morrison on 02/27/20 at  9:45 AM EST by telephone and verified that I am speaking with the correct person using two identifiers.  I discussed the limitations, risks, security and privacy concerns of performing an evaluation and management service by telephone and the availability of in person appointments. I also discussed with the patient that there may be a patient responsible charge related to this service. The patient expressed understanding and agreed to proceed.   BRIEF ONCOLOGIC HISTORY:  Oncology History Overview Note  Cancer Staging Ductal carcinoma in situ (DCIS) of right breast Staging form: Breast, AJCC 8th Edition - Clinical stage from 06/08/2019: Stage 0 (cTis (DCIS), cN0, cM0, ER-, PR-, HER2: Not Assessed) - Signed by Truitt Merle, MD on 06/21/2019    Ductal carcinoma in situ (DCIS) of right breast  05/30/2019 Mammogram   Diagnostic Mammogram  IMPRESSION The developing round calcifications in the upper outer aspect posterior depth right breast  measuring 0.6cm are suspicious for malignancy. A biopsy is recommended.    06/08/2019 Cancer Staging   Staging form: Breast, AJCC 8th Edition - Clinical stage from 06/08/2019: Stage 0 (cTis (DCIS), cN0, cM0, ER-, PR-, HER2: Not Assessed) - Signed by Truitt Merle, MD on 06/21/2019   06/08/2019 Initial Biopsy   Diagnosis Breast, right, needle core biopsy, calcifications - DUCTAL CARCINOMA IN SITU. Microscopic Comment The DCIS is of  intermediate to high nuclear grade with central necrosis and calcifications. Immunohistochemistry for p63, Calponin and SMM-1 demonstrates the presence of myoepithelium; a distinct invasive component is not identified in the submitted material. Ancillary studies will be reported separately. Results reported to [facility] on 06/09/2019.   06/08/2019 Receptors her2   PROGNOSTIC INDICATORS Results: IMMUNOHISTOCHEMICAL AND MORPHOMETRIC ANALYSIS PERFORMED MANUALLY Estrogen Receptor: 0%, NEGATIVE Progesterone Receptor: 0%, NEGATIVE   06/14/2019 Initial Diagnosis   Ductal carcinoma in situ (DCIS) of right breast    Genetic Testing   Negative genetic testing. No pathogenic variants identified on the [facility] Common Hereditary Caners Panel. VUS in APC called c.-30476T>A (Non-coding) and VUS in [place] called c.580G>A (p.Ala194Thr) identified. The report date is 07/04/2019.  The Common Hereditary Cancers Panel offered by [facility] includes sequencing and/or deletion duplication testing of the following 48 genes: APC, ATM, AXIN2, BARD1, BMPR1A, BRCA1, BRCA2, BRIP1, CDH1, CDKN2A (p14ARF), CDKN2A (p16INK4a), CKD4, CHEK2, CTNNA1, DICER1, EPCAM (Deletion/duplication testing only), GREM1 (promoter region deletion/duplication testing only), KIT, MEN1, MLH1, MSH2, MSH3, MSH6, MUTYH, NBN, NF1, NHTL1, PALB2, PDGFRA, PMS2, POLD1, POLE, PTEN, RAD50, RAD51C, RAD51D, RNF43, SDHB, SDHC, SDHD, SMAD4, SMARCA4. STK11, TP53, TSC1, TSC2, and VHL.  The following genes were evaluated for sequence changes only: SDHA and HOXB13 c.251G>A variant only.   07/29/2019 Surgery   RIGHT BREAST LUMPECTOMY WITH RADIOACTIVE SEED LOCALIZATION by Dr Barry Dienes    07/29/2019 Pathology Results   FINAL MICROSCOPIC DIAGNOSIS:   A. BREAST, RIGHT SUPERFICIAL, LUMPECTOMY:  -  No residual carcinoma identified   B. BREAST, RIGHT, LUMPECTOMY:  -  Ductal carcinoma in situ, high grade, 1.2 cm  -  Margins involved by carcinoma (lateral, broadly)   -  Previous biopsy site changes  -  See oncology table and comment below below    08/31/2019 Surgery   RE-EXCISION OF RIGHT BREAST LUMPECTOMY by Dr Barry Dienes    08/31/2019 Pathology Results   FINAL MICROSCOPIC DIAGNOSIS:   A. BREAST, RIGHT LATERAL ASPECT, RE-EXCISION:  -  Previous surgical site changes with granulation tissue  -  Vessels with organizing thrombi  -  No residual carcinoma identified    10/06/2019 - 11/02/2019 Radiation Therapy   Adjuvant Radiation with Dr Sondra Come    02/27/2020 Survivorship   SCP reviewed by Cira Rue, NP via virtual visit and mailed to patient     INTERVAL HISTORY:  Evelyn Morrison to review her survivorship care plan detailing her treatment course for breast cancer, as well as monitoring long-term side effects of that treatment, education regarding health maintenance, screening, and overall wellness and health promotion.     She is doing well, recovering from radiation with mild residual fatigue.  She was doing rehab exercises at home and then developed a "twitch" in the right breast thought maybe she overdid it.  She rested and the pain resolved.  Her swelling has resolved with a fitted/compression bra.  She is "pleased" with her skin darkening.  Denies any new lump/mass, nipple discharge or inversion, or abnormal skin change.  She has baseline arthritic pain, no new bone or joint pain.  She trips occasionally and feels off balance, no dizziness or fall.  She lives with her son.  She is eating and drinking well, mild constipation managed with Metamucil.  Weight is stable.  Denies any new concerns.   ONCOLOGY TREATMENT TEAM:  1. Surgeon:  Dr. Barry [facility]ien[facility] at [facility] 2. Medical Oncologist: Dr. Burr Medico 3. Radiation Oncologist: Dr. Sondra Come    PAST MEDICAL/SURGICAL HISTORY:  Past Medical History:  Diagnosis Date  . Anemia   . Anxiety   . Arthritis   . CAD (coronary artery disease)    Myoview 12/17: EF 66, apical and apical lateral defect - likely  attenuation artifact, no ischemia; Low Risk // Myoview 3/19:  EF 74, no ischemia or scar; low risk   . Cataract, diabetic ([place])   . Chest pain   . Chronic kidney disease    stage 3  . Constipation   . Contusion of arm   . Diabetes mellitus   . Diabetic retinopathy   . DM (diabetes mellitus) ([place])   . Dyspnea    SOb on exertion  . Esophageal stricture   . Family history of ovarian cancer   . Fatigue   . Glaucoma   . Heart burn   . Hiatal hernia   . History of echocardiogram    Echo 3/19:  Mild LVH, EF 60-65, no RWMA, Gr 1 DD, PASP 34  . HLD (hyperlipidemia)   . Hypertension   . Joint stiffness of hand   . Myocardial infarction ([place])    2004, stent  . Urinary incontinence   . Vitamin D deficiency    Past Surgical History:  Procedure Laterality Date  . adenosine cardillite  05/16/05   negative signif. myocardial ischemic  . BREAST LUMPECTOMY WITH RADIOACTIVE SEED LOCALIZATION Right 07/29/2019   Procedure: RIGHT BREAST LUMPECTOMY WITH RADIOACTIVE SEED LOCALIZATION;  Surgeon: [name], MD;  Location: [place];  Service: General;  Laterality: Right;  . CARDIAC CATHETERIZATION  10/04   s/p stent LV 45%  . CATARACT EXTRACTION     bilateral  . CESAREAN SECTION  84, 86  ectopic pregnancy  . CHOLECYSTECTOMY  1998  . CORONARY ANGIOPLASTY WITH STENT PLACEMENT  10/04  . PARTIAL HYSTERECTOMY    . RE-EXCISION OF BREAST LUMPECTOMY Right 08/31/2019   Procedure: RE-EXCISION OF RIGHT BREAST LUMPECTOMY;  Surgeon: [name], MD;  Location: [place];  Service: General;  Laterality: Right;     ALLERGIES:  Allergies  Allergen Reactions  . Penicillins Hives, Palpitations and Other (See Comments)    Mouth foams, also  Has patient had a PCN reaction causing immediate rash, facial/tongue/throat swelling, SOB or lightheadedness with hypotension: Yes Has patient had a PCN reaction causing severe rash involving mucus membranes or skin necrosis: No Has patient had a PCN  reaction that required hospitalization: No Has patient had a PCN reaction occurring within the last 10 years: No If all of the above answers are "NO", then may proceed with Cephalosporin use.   . Ibuprofen Other (See Comments)    Made the patient shake and reacts with some of her meds  . Lactose Intolerance (Gi) Other (See Comments)    Stomach pain  . Milk-Related Compounds Other (See Comments)    Causes a stomach ache  . Nyquil Multi-Symptom [Pseudoeph-Doxylamine-Dm-Apap] Other (See Comments)    Made the patient shake (tolerates Coricidin)  . Latex Rash    No "black" latex     CURRENT MEDICATIONS:  Outpatient Encounter Medications as of 02/27/2020  Medication Sig  . Accu-Chek FastClix Lancets MISC 1 each by Other route 3 (three) times daily.  Marland Kitchen ACCU-CHEK GUIDE test strip 1 each by Other route as needed.  Marland Kitchen acetaminophen (TYLENOL) 500 MG tablet Take 500-1,000 mg by mouth every 6 (six) hours as needed for moderate pain.   Marland Kitchen albuterol (PROVENTIL HFA;VENTOLIN HFA) 108 (90 Base) MCG/ACT inhaler Inhale 1-2 puffs into the lungs every 4 (four) hours as needed for wheezing or shortness of breath.  Marland Kitchen aspirin 81 MG EC tablet Take 81 mg by mouth every morning.   . Cholecalciferol (D3 HIGH POTENCY) 50 MCG (2000 UT) CAPS Take 2,000 Units by mouth daily.   . ferrous sulfate 325 (65 FE) MG tablet Take 325 mg by mouth daily with breakfast.  . gabapentin (NEURONTIN) 300 MG capsule Take 300 mg by mouth 2 (two) times daily.  Marland Kitchen lisinopril-hydrochlorothiazide (PRINZIDE,ZESTORETIC) 20-12.5 MG tablet Take 1 tablet by mouth daily.  Marland Kitchen lovastatin (MEVACOR) 10 MG tablet Take 10 mg by mouth at bedtime.  Marland Kitchen MYRBETRIQ 25 MG TB24 tablet Take 25 mg by mouth daily.   . nitroGLYCERIN (NITROSTAT) 0.4 MG SL tablet Place 0.4 mg under the tongue every 5 (five) minutes as needed for chest pain (3 doses max).   . TURMERIC PO Take 1 capsule by mouth daily as needed (arthritis pain).   No facility-administered encounter  medications on file as of 02/27/2020.     ONCOLOGIC FAMILY HISTORY:  Family History  Problem Relation Age of Onset  . Heart disease Mother   . Heart attack Father   . Diabetes Other   . Crohn's disease Brother   . Crohn's disease Brother   . Crohn's disease Brother   . Ovarian cancer Cousin 45  . Colon cancer Neg Hx      GENETIC COUNSELING/TESTING: Yes, VUS in APC gene on 06/22/2019  SOCIAL HISTORY:  Social History   Socioeconomic History  . Marital status: Married    Spouse name: Not on file  . Number of children: 5   . Years of education: Not on file  . Highest education level:  Not on file  Occupational History  . Occupation: Education officer, museum    Comment: Retired  Tobacco Use  . Smoking status: Former Smoker    Packs/day: 0.25    Years: 15.00    Pack years: 3.75    Quit date: 03/31/1990    Years since quitting: 29.9  . Smokeless tobacco: Never Used  Substance and Sexual Activity  . Alcohol use: No    Comment: ocassional  . Drug use: No  . Sexual activity: Not on file  Other Topics Concern  . Not on file  Social History Narrative   2 caffeine drinks daily    Social Determinants of Health   Financial Resource Strain:   . Difficulty of Paying Living Expenses: Not on file  Food Insecurity:   . Worried About Charity fundraiser in the Last Year: Not on file  . Ran Out of Food in the Last Year: Not on file  Transportation Needs:   . Lack of Transportation (Medical): Not on file  . Lack of Transportation (Non-Medical): Not on file  Physical Activity:   . Days of Exercise per Week: Not on file  . Minutes of Exercise per Session: Not on file  Stress:   . Feeling of Stress : Not on file  Social Connections:   . Frequency of Communication with Friends and Family: Not on file  . Frequency of Social Gatherings with Friends and Family: Not on file  . Attends Religious Services: Not on file  . Active Member of Clubs or Organizations: Not on file  . Attends Theatre manager Meetings: Not on file  . Marital Status: Not on file  Intimate Partner Violence:   . Fear of Current or Ex-Partner: Not on file  . Emotionally Abused: Not on file  . Physically Abused: Not on file  . Sexually Abused: Not on file     OBSERVATIONS/OBJECTIVE:  Patient appears well over the phone.  Speech is clear and intact.  Mood and affect appear normal for situation.  No cough or conversational dyspnea.  LABORATORY DATA:  None for this visit.  DIAGNOSTIC IMAGING:  None for this visit.      ASSESSMENT AND PLAN:  Ms.. Morrison is a pleasant 79 y.o. 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  She presents to the Survivorship Clinic for our initial meeting and routine follow-up post-completion of treatment for breast cancer.    1. Stage 0 right breast DCIS: Evelyn Morrison is continuing to recover from definitive treatment for breast cancer. She will follow-up with her medical oncologist, Dr. Burr Medico in 10/2020 with history and physical exam.  Due to her ER/PR negative DCIS, she is not a candidate for anti-estrogen therapy. Her mammogram is due 05/2020 and screening breast MRI in 10/2020. Today, a comprehensive survivorship care plan and treatment summary was reviewed with the patient today detailing her breast cancer diagnosis, treatment course, potential late/long-term effects of treatment, appropriate follow-up care with recommendations for the future, and patient education resources.  A copy of this summary, along with a letter will be sent to the patient's primary care provider via In Basket message after today's visit.    3. Bone health:  Given Evelyn Morrison's age, she is at risk for bone demineralization.  Her last DEXA scan was 06/14/2010, managed by PCP.  In the meantime, she was encouraged to increase her consumption of foods rich in calcium, as well as  increase her weight-bearing  activities.  She was given education on specific activities to promote bone health.  4. Cancer screening:  Due to Evelyn Morrison's history and her age, she should receive screening for skin cancers and breast cancers.  She should use shared decision making to determine if she should continue colon cancer and gynecologic cancer screenings. The information and recommendations are listed on the patient's comprehensive care plan/treatment summary and were reviewed in detail with the patient.    5. Health maintenance and wellness promotion: Evelyn Morrison was encouraged to consume 5-7 servings of fruits and vegetables per day. We reviewed the "Nutrition Rainbow" handout, as well as the handout "Take Control of Your Health and Reduce Your Cancer Risk" from the [place].  She was also encouraged to engage in age-appropriate and safe exercise for 30 minutes per day most days of the week. We discussed the [employer] fitness program, which is designed for cancer survivors to help them become more physically fit after cancer treatments.  She was instructed to limit her alcohol consumption and continue to abstain from tobacco use.   6. Support services/counseling: It is not uncommon for this period of the patient's cancer care trajectory to be one of many emotions and stressors.  We discussed how this can be increasingly difficult during the times of quarantine and social distancing due to the COVID-19 pandemic.   She was given information regarding our available services and encouraged to contact me with any questions or for help enrolling in any of our support group/programs.    Follow up instructions:    -Follow up with Dr. Barry Dienes, surgery in 05/2020   -Mammogram due in 05/2020 -breast MRI in 10/2020 -Return to cancer center 11/05/2020 with Dr. Burr Medico  -Continue routine care with PCP and other providers  -She is welcome to return back to the Survivorship Clinic at any time; no additional follow-up  needed at this time.  -Consider referral back to survivorship as a long-term survivor for continued surveillance The patient was provided an opportunity to ask questions and all were answered. The patient agreed with the plan and demonstrated an understanding of the instructions.  The patient was advised to call back or seek an in-person evaluation if the symptoms worsen or if the condition fails to improve as anticipated.  I provided 30 minutes of non-face-to-face time during this encounter.     Alla Feeling, NP  02/27/2020

## 2020-04-01 DIAGNOSIS — Z20822 Contact with and (suspected) exposure to covid-19: Secondary | ICD-10-CM | POA: Diagnosis not present

## 2020-04-11 DIAGNOSIS — E119 Type 2 diabetes mellitus without complications: Secondary | ICD-10-CM | POA: Diagnosis not present

## 2020-04-11 DIAGNOSIS — E782 Mixed hyperlipidemia: Secondary | ICD-10-CM | POA: Diagnosis not present

## 2020-04-11 DIAGNOSIS — I10 Essential (primary) hypertension: Secondary | ICD-10-CM | POA: Diagnosis not present

## 2020-04-11 DIAGNOSIS — I25118 Atherosclerotic heart disease of native coronary artery with other forms of angina pectoris: Secondary | ICD-10-CM | POA: Diagnosis not present

## 2020-04-11 DIAGNOSIS — E1142 Type 2 diabetes mellitus with diabetic polyneuropathy: Secondary | ICD-10-CM | POA: Diagnosis not present

## 2020-04-19 DIAGNOSIS — C50911 Malignant neoplasm of unspecified site of right female breast: Secondary | ICD-10-CM | POA: Diagnosis not present

## 2020-04-23 DIAGNOSIS — C50411 Malignant neoplasm of upper-outer quadrant of right female breast: Secondary | ICD-10-CM | POA: Diagnosis not present

## 2020-04-23 DIAGNOSIS — Z171 Estrogen receptor negative status [ER-]: Secondary | ICD-10-CM | POA: Diagnosis not present

## 2020-05-14 DIAGNOSIS — R928 Other abnormal and inconclusive findings on diagnostic imaging of breast: Secondary | ICD-10-CM | POA: Diagnosis not present

## 2020-05-17 DIAGNOSIS — Z0001 Encounter for general adult medical examination with abnormal findings: Secondary | ICD-10-CM | POA: Diagnosis not present

## 2020-05-17 DIAGNOSIS — Z136 Encounter for screening for cardiovascular disorders: Secondary | ICD-10-CM | POA: Diagnosis not present

## 2020-05-17 DIAGNOSIS — Z1329 Encounter for screening for other suspected endocrine disorder: Secondary | ICD-10-CM | POA: Diagnosis not present

## 2020-05-17 DIAGNOSIS — E782 Mixed hyperlipidemia: Secondary | ICD-10-CM | POA: Diagnosis not present

## 2020-05-17 DIAGNOSIS — I25118 Atherosclerotic heart disease of native coronary artery with other forms of angina pectoris: Secondary | ICD-10-CM | POA: Diagnosis not present

## 2020-05-17 DIAGNOSIS — I10 Essential (primary) hypertension: Secondary | ICD-10-CM | POA: Diagnosis not present

## 2020-05-17 DIAGNOSIS — E1142 Type 2 diabetes mellitus with diabetic polyneuropathy: Secondary | ICD-10-CM | POA: Diagnosis not present

## 2020-05-17 DIAGNOSIS — E119 Type 2 diabetes mellitus without complications: Secondary | ICD-10-CM | POA: Diagnosis not present

## 2020-06-06 DIAGNOSIS — D631 Anemia in chronic kidney disease: Secondary | ICD-10-CM | POA: Diagnosis not present

## 2020-06-06 DIAGNOSIS — I129 Hypertensive chronic kidney disease with stage 1 through stage 4 chronic kidney disease, or unspecified chronic kidney disease: Secondary | ICD-10-CM | POA: Diagnosis not present

## 2020-06-06 DIAGNOSIS — C50911 Malignant neoplasm of unspecified site of right female breast: Secondary | ICD-10-CM | POA: Diagnosis not present

## 2020-06-06 DIAGNOSIS — N189 Chronic kidney disease, unspecified: Secondary | ICD-10-CM | POA: Diagnosis not present

## 2020-06-06 DIAGNOSIS — N183 Chronic kidney disease, stage 3 unspecified: Secondary | ICD-10-CM | POA: Diagnosis not present

## 2020-06-06 DIAGNOSIS — E559 Vitamin D deficiency, unspecified: Secondary | ICD-10-CM | POA: Diagnosis not present

## 2020-06-14 DIAGNOSIS — I10 Essential (primary) hypertension: Secondary | ICD-10-CM | POA: Diagnosis not present

## 2020-06-14 DIAGNOSIS — L308 Other specified dermatitis: Secondary | ICD-10-CM | POA: Diagnosis not present

## 2020-06-14 DIAGNOSIS — M25562 Pain in left knee: Secondary | ICD-10-CM | POA: Diagnosis not present

## 2020-06-14 DIAGNOSIS — E782 Mixed hyperlipidemia: Secondary | ICD-10-CM | POA: Diagnosis not present

## 2020-06-14 DIAGNOSIS — E119 Type 2 diabetes mellitus without complications: Secondary | ICD-10-CM | POA: Diagnosis not present

## 2020-06-14 DIAGNOSIS — N1831 Chronic kidney disease, stage 3a: Secondary | ICD-10-CM | POA: Diagnosis not present

## 2020-06-14 DIAGNOSIS — E1142 Type 2 diabetes mellitus with diabetic polyneuropathy: Secondary | ICD-10-CM | POA: Diagnosis not present

## 2020-06-14 DIAGNOSIS — I25118 Atherosclerotic heart disease of native coronary artery with other forms of angina pectoris: Secondary | ICD-10-CM | POA: Diagnosis not present

## 2020-06-25 DIAGNOSIS — M1712 Unilateral primary osteoarthritis, left knee: Secondary | ICD-10-CM | POA: Diagnosis not present

## 2020-07-25 DIAGNOSIS — E119 Type 2 diabetes mellitus without complications: Secondary | ICD-10-CM | POA: Diagnosis not present

## 2020-07-25 DIAGNOSIS — H52203 Unspecified astigmatism, bilateral: Secondary | ICD-10-CM | POA: Diagnosis not present

## 2020-08-07 ENCOUNTER — Emergency Department (HOSPITAL_COMMUNITY): Payer: Medicare HMO

## 2020-08-07 ENCOUNTER — Encounter (HOSPITAL_COMMUNITY): Payer: Self-pay

## 2020-08-07 ENCOUNTER — Other Ambulatory Visit: Payer: Self-pay

## 2020-08-07 ENCOUNTER — Emergency Department (HOSPITAL_COMMUNITY)
Admission: EM | Admit: 2020-08-07 | Discharge: 2020-08-07 | Disposition: A | Discharge status: Home / Self Care | Payer: Medicare HMO | Attending: Emergency Medicine | Admitting: Emergency Medicine

## 2020-08-07 ENCOUNTER — Encounter (HOSPITAL_COMMUNITY): Payer: Self-pay | Admitting: Emergency Medicine

## 2020-08-07 ENCOUNTER — Ambulatory Visit (HOSPITAL_COMMUNITY)
Admission: EM | Admit: 2020-08-07 | Discharge: 2020-08-07 | Disposition: A | Discharge status: Other Acute Inpatient Hospital | Payer: Medicare HMO

## 2020-08-07 DIAGNOSIS — Z955 Presence of coronary angioplasty implant and graft: Secondary | ICD-10-CM | POA: Insufficient documentation

## 2020-08-07 DIAGNOSIS — E1136 Type 2 diabetes mellitus with diabetic cataract: Secondary | ICD-10-CM | POA: Insufficient documentation

## 2020-08-07 DIAGNOSIS — R109 Unspecified abdominal pain: Secondary | ICD-10-CM | POA: Insufficient documentation

## 2020-08-07 DIAGNOSIS — I2510 Atherosclerotic heart disease of native coronary artery without angina pectoris: Secondary | ICD-10-CM | POA: Diagnosis not present

## 2020-08-07 DIAGNOSIS — K625 Hemorrhage of anus and rectum: Secondary | ICD-10-CM | POA: Diagnosis not present

## 2020-08-07 DIAGNOSIS — E11319 Type 2 diabetes mellitus with unspecified diabetic retinopathy without macular edema: Secondary | ICD-10-CM | POA: Diagnosis not present

## 2020-08-07 DIAGNOSIS — E1122 Type 2 diabetes mellitus with diabetic chronic kidney disease: Secondary | ICD-10-CM | POA: Insufficient documentation

## 2020-08-07 DIAGNOSIS — K219 Gastro-esophageal reflux disease without esophagitis: Secondary | ICD-10-CM | POA: Insufficient documentation

## 2020-08-07 DIAGNOSIS — I129 Hypertensive chronic kidney disease with stage 1 through stage 4 chronic kidney disease, or unspecified chronic kidney disease: Secondary | ICD-10-CM | POA: Diagnosis not present

## 2020-08-07 DIAGNOSIS — R100 Acute abdomen: Secondary | ICD-10-CM | POA: Diagnosis not present

## 2020-08-07 DIAGNOSIS — Z87891 Personal history of nicotine dependence: Secondary | ICD-10-CM | POA: Insufficient documentation

## 2020-08-07 DIAGNOSIS — R197 Diarrhea, unspecified: Secondary | ICD-10-CM | POA: Diagnosis not present

## 2020-08-07 DIAGNOSIS — K529 Noninfective gastroenteritis and colitis, unspecified: Secondary | ICD-10-CM | POA: Diagnosis not present

## 2020-08-07 DIAGNOSIS — Z7984 Long term (current) use of oral hypoglycemic drugs: Secondary | ICD-10-CM | POA: Diagnosis not present

## 2020-08-07 DIAGNOSIS — D3500 Benign neoplasm of unspecified adrenal gland: Secondary | ICD-10-CM | POA: Diagnosis not present

## 2020-08-07 DIAGNOSIS — E785 Hyperlipidemia, unspecified: Secondary | ICD-10-CM | POA: Diagnosis not present

## 2020-08-07 DIAGNOSIS — Z91040 Latex allergy status: Secondary | ICD-10-CM | POA: Diagnosis not present

## 2020-08-07 DIAGNOSIS — K3189 Other diseases of stomach and duodenum: Secondary | ICD-10-CM | POA: Diagnosis not present

## 2020-08-07 DIAGNOSIS — K4090 Unilateral inguinal hernia, without obstruction or gangrene, not specified as recurrent: Secondary | ICD-10-CM | POA: Diagnosis not present

## 2020-08-07 DIAGNOSIS — Z79899 Other long term (current) drug therapy: Secondary | ICD-10-CM | POA: Insufficient documentation

## 2020-08-07 DIAGNOSIS — Z86000 Personal history of in-situ neoplasm of breast: Secondary | ICD-10-CM | POA: Diagnosis not present

## 2020-08-07 DIAGNOSIS — Z7982 Long term (current) use of aspirin: Secondary | ICD-10-CM | POA: Insufficient documentation

## 2020-08-07 DIAGNOSIS — N289 Disorder of kidney and ureter, unspecified: Secondary | ICD-10-CM

## 2020-08-07 DIAGNOSIS — Z20822 Contact with and (suspected) exposure to covid-19: Secondary | ICD-10-CM | POA: Insufficient documentation

## 2020-08-07 DIAGNOSIS — E1169 Type 2 diabetes mellitus with other specified complication: Secondary | ICD-10-CM | POA: Insufficient documentation

## 2020-08-07 DIAGNOSIS — N183 Chronic kidney disease, stage 3 unspecified: Secondary | ICD-10-CM | POA: Insufficient documentation

## 2020-08-07 LAB — COMPREHENSIVE METABOLIC PANEL
ALT: 22 U/L (ref 0–44)
AST: 27 U/L (ref 15–41)
Albumin: 3.6 g/dL (ref 3.5–5.0)
Alkaline Phosphatase: 56 U/L (ref 38–126)
Anion gap: 12 (ref 5–15)
BUN: 25 mg/dL — ABNORMAL HIGH (ref 8–23)
CO2: 24 mmol/L (ref 22–32)
Calcium: 9.7 mg/dL (ref 8.9–10.3)
Chloride: 104 mmol/L (ref 98–111)
Creatinine, Ser: 1.62 mg/dL — ABNORMAL HIGH (ref 0.44–1.00)
GFR, Estimated: 32 mL/min — ABNORMAL LOW (ref >60)
Glucose, Bld: 119 mg/dL — ABNORMAL HIGH (ref 70–99)
Potassium: 4.1 mmol/L (ref 3.5–5.1)
Sodium: 140 mmol/L (ref 135–145)
Total Bilirubin: 0.8 mg/dL (ref 0.3–1.2)
Total Protein: 7.3 g/dL (ref 6.5–8.1)

## 2020-08-07 LAB — POCT URINALYSIS DIPSTICK, ED / UC
Bilirubin Urine: NEGATIVE
Glucose, UA: NEGATIVE mg/dL
Leukocytes,Ua: NEGATIVE
Nitrite: NEGATIVE
Protein, ur: NEGATIVE mg/dL
Specific Gravity, Urine: 1.025 (ref 1.005–1.030)
Urobilinogen, UA: 0.2 mg/dL (ref 0.0–1.0)
pH: 5 (ref 5.0–8.0)

## 2020-08-07 LAB — CBC WITH DIFFERENTIAL/PLATELET
Abs Immature Granulocytes: 0.04 10*3/uL (ref 0.00–0.07)
Basophils Absolute: 0 10*3/uL (ref 0.0–0.1)
Basophils Relative: 0 %
Eosinophils Absolute: 0 10*3/uL (ref 0.0–0.5)
Eosinophils Relative: 0 %
HCT: 41.7 % (ref 36.0–46.0)
Hemoglobin: 13.3 g/dL (ref 12.0–15.0)
Immature Granulocytes: 1 %
Lymphocytes Relative: 11 %
Lymphs Abs: 0.9 10*3/uL (ref 0.7–4.0)
MCH: 30.1 pg (ref 26.0–34.0)
MCHC: 31.9 g/dL (ref 30.0–36.0)
MCV: 94.3 fL (ref 80.0–100.0)
Monocytes Absolute: 0.7 10*3/uL (ref 0.1–1.0)
Monocytes Relative: 9 %
Neutro Abs: 6.5 10*3/uL (ref 1.7–7.7)
Neutrophils Relative %: 79 %
Platelets: 148 10*3/uL — ABNORMAL LOW (ref 150–400)
RBC: 4.42 MIL/uL (ref 3.87–5.11)
RDW: 13.6 % (ref 11.5–15.5)
WBC: 8.1 10*3/uL (ref 4.0–10.5)
nRBC: 0 % (ref 0.0–0.2)

## 2020-08-07 LAB — URINALYSIS, ROUTINE W REFLEX MICROSCOPIC
Bilirubin Urine: NEGATIVE
Glucose, UA: NEGATIVE mg/dL
Hgb urine dipstick: NEGATIVE
Ketones, ur: 5 mg/dL — AB
Leukocytes,Ua: NEGATIVE
Nitrite: NEGATIVE
Protein, ur: NEGATIVE mg/dL
Specific Gravity, Urine: 1.029 (ref 1.005–1.030)
pH: 5 (ref 5.0–8.0)

## 2020-08-07 LAB — RESP PANEL BY RT-PCR (FLU A&B, COVID) ARPGX2
Influenza A by PCR: NEGATIVE
Influenza B by PCR: NEGATIVE
SARS Coronavirus 2 by RT PCR: NEGATIVE

## 2020-08-07 LAB — POC OCCULT BLOOD, ED: Fecal Occult Bld: NEGATIVE

## 2020-08-07 LAB — LIPASE, BLOOD: Lipase: 24 U/L (ref 11–51)

## 2020-08-07 LAB — POCT RAPID STREP A, ED / UC: Streptococcus, Group A Screen (Direct): NEGATIVE

## 2020-08-07 LAB — LACTIC ACID, PLASMA: Lactic Acid, Venous: 1.5 mmol/L (ref 0.5–1.9)

## 2020-08-07 MED ORDER — CIPROFLOXACIN HCL 500 MG PO TABS
500.0000 mg | ORAL_TABLET | Freq: Two times a day (BID) | ORAL | 0 refills | Status: DC
Start: 2020-08-07 — End: 2020-09-24

## 2020-08-07 MED ORDER — CIPROFLOXACIN IN D5W 400 MG/200ML IV SOLN
400.0000 mg | Freq: Once | INTRAVENOUS | Status: AC
  Administered 2020-08-07: 400 mg via INTRAVENOUS
  Filled 2020-08-07: qty 200

## 2020-08-07 MED ORDER — METRONIDAZOLE 500 MG PO TABS
500.0000 mg | ORAL_TABLET | Freq: Two times a day (BID) | ORAL | 0 refills | Status: DC
Start: 2020-08-07 — End: 2020-09-24

## 2020-08-07 MED ORDER — SODIUM CHLORIDE 0.9 % IV BOLUS
1000.0000 mL | Freq: Once | INTRAVENOUS | Status: AC
  Administered 2020-08-07: 1000 mL via INTRAVENOUS

## 2020-08-07 MED ORDER — METRONIDAZOLE 500 MG/100ML IV SOLN
500.0000 mg | Freq: Once | INTRAVENOUS | Status: AC
  Administered 2020-08-07: 500 mg via INTRAVENOUS
  Filled 2020-08-07: qty 100

## 2020-08-07 NOTE — ED Provider Notes (Signed)
1800: I received a call from CT tech regarding this patient's CTAP with contrast that I ordered. Her creatinine and GFR are borderline.  Will obtain CT AP without contrast. Consider giving IVF.    Kinnie Feil, PA-C 08/07/20 [place], [address], DO 08/08/20 [phone]

## 2020-08-07 NOTE — ED Triage Notes (Addendum)
Pt sent by UC for further evaluation of lower abdominal pain, rectal bleeding, nausea/vomiting and diarrhea x 1 day.

## 2020-08-07 NOTE — ED Provider Notes (Signed)
[facility]    CSN: [id number] Arrival date & time: 08/07/20  1327      History   Chief Complaint Chief Complaint  Patient presents with  . Abdominal Pain  . Hematochezia    HPI Evelyn Morrison is a 80 y.o. female who presents with lower abdominal pain from L to R since yesterday, and vomited x 4 and had 6 diarrhea BM's last night, and the last 2 today had about a cup of bright blood in the toilet. She denies anal pain. Has hx of chronic constipation and alternates between miralax and metamusil. Her last BM was 2 days ago. Her last colonoscopy was >10 years ago and was told it was normal and never been told to have it repeated. She felt feverish yesterday, but did not take her temp  Past Medical History:  Diagnosis Date  . Anemia   . Anxiety   . Arthritis   . CAD (coronary artery disease)    Myoview 12/17: EF 66, apical and apical lateral defect - likely attenuation artifact, no ischemia; Low Risk // Myoview 3/19:  EF 74, no ischemia or scar; low risk   . Cataract, diabetic ([place])   . Chest pain   . Chronic kidney disease    stage 3  . Constipation   . Contusion of arm   . Diabetes mellitus   . Diabetic retinopathy   . DM (diabetes mellitus) ([place])   . Dyspnea    SOb on exertion  . Esophageal stricture   . Family history of ovarian cancer   . Fatigue   . Glaucoma   . Heart burn   . Hiatal hernia   . History of echocardiogram    Echo 3/19:  Mild LVH, EF 60-65, no RWMA, Gr 1 DD, PASP 34  . HLD (hyperlipidemia)   . Hypertension   . Joint stiffness of hand   . Myocardial infarction ([place])    2004, stent  . Urinary incontinence   . Vitamin D deficiency     Patient Active Problem List   Diagnosis Date Noted  . Genetic testing 07/05/2019  . Family history of ovarian cancer   . 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 Dysphagia, unspecified(787.20) 10/23/2010  . Abdominal pain, unspecified site 10/23/2010  . Stricture esophagus  10/23/2010  . Irritable bowel syndrome (IBS) 10/23/2010  . Constipation, chronic 10/23/2010  . Syncope 10/16/2010  . GERD (gastroesophageal reflux disease) 09/11/2010  . Chest pain, unspecified 09/11/2010  . ABSCESS 05/23/2010  . ACUTE CYSTITIS 05/31/2009  . SHOULDER PAIN, LEFT 05/31/2009  . POSTMENOPAUSAL STATUS 05/31/2009  . CONSTIPATION 11/03/2008  . CONTUSION, ARM 07/20/2008  . JOINT STIFFNESS, HAND 06/15/2008  . URINARY INCONTINENCE 06/15/2008  . VITAMIN D DEFICIENCY 03/20/2008  . ARTHRITIS 03/14/2008  . ANEMIA 03/08/2008  . Type 2 diabetes mellitus without complication, without long-term current use of insulin ([place]) 12/11/2006  . DIABETIC  RETINOPATHY 12/11/2006  . HLD (hyperlipidemia) 12/11/2006  . CATARACT, DIABETIC 12/11/2006  . Essential hypertension 12/11/2006  . Coronary artery disease involving native coronary artery of native heart without angina pectoris 12/11/2006  . FATIGUE 12/11/2006    Past Surgical History:  Procedure Laterality Date  . adenosine cardillite  05/16/05   negative signif. myocardial ischemic  . BREAST LUMPECTOMY WITH RADIOACTIVE SEED LOCALIZATION Right 07/29/2019   Procedure: RIGHT BREAST LUMPECTOMY WITH RADIOACTIVE SEED LOCALIZATION;  Surgeon: [name], MD;  Location: [facility];  Service: General;  Laterality: Right;  .  CARDIAC CATHETERIZATION  10/04   s/p stent LV 45%  . CATARACT EXTRACTION     bilateral  . CESAREAN SECTION  84, 86   ectopic pregnancy  . CHOLECYSTECTOMY  1998  . CORONARY ANGIOPLASTY WITH STENT PLACEMENT  10/04  . PARTIAL HYSTERECTOMY    . RE-EXCISION OF BREAST LUMPECTOMY Right 08/31/2019   Procedure: RE-EXCISION OF RIGHT BREAST LUMPECTOMY;  Surgeon: [name], MD;  Location: [facility];  Service: General;  Laterality: Right;    OB History   No obstetric history on file.      Home Medications    Prior to Admission medications   Medication Sig Start Date End Date Taking? Authorizing Provider   Accu-Chek FastClix Lancets MISC 1 each by Other route 3 (three) times daily. 08/19/19   [provider]  ACCU-CHEK GUIDE test strip 1 each by Other route as needed. 12/15/19   [provider]  acetaminophen (TYLENOL) 500 MG tablet Take 500-1,000 mg by mouth every 6 (six) hours as needed for moderate pain.     [provider]  albuterol (PROVENTIL HFA;VENTOLIN HFA) 108 (90 Base) MCG/ACT inhaler Inhale 1-2 puffs into the lungs every 4 (four) hours as needed for wheezing or shortness of breath. 02/22/18   Wynona Luna, MD  amLODipine (NORVASC) 10 MG tablet Take 1 tablet by mouth daily. 07/07/20   [provider]  aspirin 81 MG EC tablet Take 81 mg by mouth every morning.     [provider]  Cholecalciferol (D3 HIGH POTENCY) 50 MCG (2000 UT) CAPS Take 2,000 Units by mouth daily.     [provider]  ferrous sulfate 325 (65 FE) MG tablet Take 325 mg by mouth daily with breakfast.    [provider]  gabapentin (NEURONTIN) 300 MG capsule Take 300 mg by mouth 2 (two) times daily.    [provider]  glipiZIDE (GLUCOTROL) 5 MG tablet Take 5 mg by mouth daily. 07/05/20   [provider]  lisinopril-hydrochlorothiazide (PRINZIDE,ZESTORETIC) 20-12.5 MG tablet Take 1 tablet by mouth daily.    [provider]  lovastatin (MEVACOR) 10 MG tablet Take 10 mg by mouth at bedtime.    [provider]  MYRBETRIQ 25 MG TB24 tablet Take 25 mg by mouth daily.  04/25/19   [provider]  nitroGLYCERIN (NITROSTAT) 0.4 MG SL tablet Place 0.4 mg under the tongue every 5 (five) minutes as needed for chest pain (3 doses max).     [provider]  TURMERIC PO Take 1 capsule by mouth daily as needed (arthritis pain).    [provider]    Family History Family History  Problem Relation Age of Onset  . Heart disease Mother   . Heart attack Father   . Diabetes Other   . Crohn's disease Brother   .  Crohn's disease Brother   . Crohn's disease Brother   . Ovarian cancer Cousin 10  . Colon cancer Neg Hx     Social History Social History   Tobacco Use  . Smoking status: Former Smoker    Packs/day: 0.25    Years: 15.00    Pack years: 3.75    Quit date: 03/31/1990    Years since quitting: 30.3  . Smokeless tobacco: Never Used  Substance Use Topics  . Alcohol use: No    Comment: ocassional  . Drug use: No     Allergies   Penicillins, Ibuprofen, Lactose intolerance (gi), Milk-related compounds, Nyquil multi-symptom [pseudoeph-doxylamine-dm-apap], and Latex  Review of Systems Review of Systems  Constitutional: Negative for fever.  Gastrointestinal: Positive for abdominal pain, blood in stool, constipation, diarrhea, nausea and vomiting.  Neurological: Positive for weakness.     Physical Exam Triage Vital Signs ED Triage Vitals  Enc Vitals Group     BP 08/07/20 1514 113/70     Pulse Rate 08/07/20 1514 88     Resp 08/07/20 1514 18     Temp 08/07/20 1514 98.4 F (36.9 C)     Temp Source 08/07/20 1514 Oral     SpO2 08/07/20 1514 99 %     Weight --      Height --      Head Circumference --      Peak Flow --      Pain Score 08/07/20 1510 5     Pain Loc --      Pain Edu? --      Excl. in [place]? --    No data found.  Updated Vital Signs BP 113/70 (BP Location: Right Arm)   Pulse 88   Temp 98.4 F (36.9 C) (Oral)   Resp 18   SpO2 99%   Visual Acuity Right Eye Distance:   Left Eye Distance:   Bilateral Distance:    Right Eye Near:   Left Eye Near:    Bilateral Near:     Physical Exam Vitals and nursing note reviewed.  Constitutional:      General: She is not in acute distress.    Appearance: She is not toxic-appearing.  HENT:     Head: Normocephalic.  Pulmonary:     Effort: Pulmonary effort is normal.  Abdominal:     General: A surgical scar is present. Bowel sounds are normal.     Palpations: Abdomen is soft.     Tenderness: There is abdominal  tenderness in the right lower quadrant, suprapubic area and left lower quadrant. There is guarding and rebound.  Skin:    General: Skin is warm and dry.     Findings: No rash.  Neurological:     General: No focal deficit present.     Mental Status: She is alert and oriented to person, place, and time.  Psychiatric:        Mood and Affect: Mood normal.      UC Treatments / Results  Labs (all labs ordered are listed, but only abnormal results are displayed) Labs Reviewed  POCT URINALYSIS DIPSTICK, ED / UC - Abnormal; Notable for the following components:      Result Value   Ketones, ur TRACE (*)    Hgb urine dipstick TRACE (*)    All other components within normal limits    EKG   Radiology No results found.  Procedures Procedures (including critical care time)  Medications Ordered in UC Medications - No data to display  Initial Impression / Assessment and Plan / UC Course  I have reviewed the triage vital signs and the nursing notes. She was sent to ER for further work up.  Final Clinical Impressions(s) / UC Diagnoses   Final diagnoses:  Acute abdomen  Rectal bleeding     Discharge Instructions     Go to the ER right now since you will need blood work and a cat scan of your abdomen.     ED Prescriptions    None     PDMP not reviewed this encounter.   Shelby Mattocks, [place] 08/07/20 1543

## 2020-08-07 NOTE — ED Notes (Signed)
Provider made aware only able to get one set of BC at this time. Advised to go ahead and start antibiotics at this time

## 2020-08-07 NOTE — ED Provider Notes (Addendum)
Emergency Medicine Provider Triage Evaluation Note  Evelyn Morrison , a 80 y.o. female  was evaluated in triage.  Pt complains of blood in her stool.  clots are the color of beets.  Up to 6 episodes. Beginning yesterday at 1 pm.  Associated with diffuse lower abdominal pain, nausea, vomiting. Felt feverish. Aspirin. Seen at [facility].   Review of Systems  Positive: Nausea vomiting abdominal pain blood in stool Negative: dysuria  Physical Exam  BP (!) 140/95 (BP Location: Left Arm)   Pulse 95   Temp 98.6 F (37 C) (Oral)   Resp 18   SpO2 100%  Gen:   Awake, no distress   Resp:  Normal effort  MSK:   Moves extremities without difficulty  Abd:  Mild diffuse abd tenderness  Medical Decision Making  Medically screening exam initiated at 4:28 PM.  Appropriate orders placed.  Mao Lockner was informed that the remainder of the evaluation will be completed by another provider, this initial triage assessment does not replace that evaluation, and the importance of remaining in the ED until their evaluation is complete.   Patient made aware this encounter is a triage and screening encounter and no beds are immediately available at this time in the ER.  Patient was informed that the remainder of the evaluation will be completed by another provider.  Patient made aware triage orders have been placed and patient will be placed in the waiting room while work up is initiated and until a room becomes available. Patient encouraged to await a formal ER encounter with a clinician.  Patient made aware that exiting the department prior to formal encounter with an ER clinician and completion of the work-up is considered leaving against medical advice.  At that time there is no guarantee that there are no emergency medical conditions present and patient assumes risks of leaving including worsening condition, permanent disability and death. Patient verbalizes understanding.     Kinnie Feil, PA-C 08/07/20  [place], [place], DO 08/08/20 [phone]

## 2020-08-07 NOTE — Discharge Instructions (Signed)
Go to the ER right now since you will need blood work and a cat scan of your abdomen.

## 2020-08-07 NOTE — ED Notes (Signed)
Provider at bedside at this time

## 2020-08-07 NOTE — ED Notes (Signed)
DC instructions reviewed with pt. PT verbalized understanding.  PT Dc.  

## 2020-08-07 NOTE — ED Provider Notes (Signed)
[facility] EMERGENCY DEPARTMENT Provider Note   CSN: [id number] Arrival date & time: 08/07/20  1619     History Chief Complaint  Patient presents with  . Rectal Bleeding    Evelyn Morrison is a 80 y.o. female history of CAD, CKD, diabetes, here presenting with loose stools and blood in her stool.  Patient states that she has abdominal cramps since yesterday.  She has about 6 episodes of diarrhea.  She states that 2 of them has some blood mixed in it.  She has some low-grade temperature as well.  She also has diffuse lower abdominal pain.  Denies any vomiting.   The history is provided by the patient.       Past Medical History:  Diagnosis Date  . Anemia   . Anxiety   . Arthritis   . CAD (coronary artery disease)    Myoview 12/17: EF 66, apical and apical lateral defect - likely attenuation artifact, no ischemia; Low Risk // Myoview 3/19:  EF 74, no ischemia or scar; low risk   . Cataract, diabetic ([place])   . Chest pain   . Chronic kidney disease    stage 3  . Constipation   . Contusion of arm   . Diabetes mellitus   . Diabetic retinopathy   . DM (diabetes mellitus) ([place])   . Dyspnea    SOb on exertion  . Esophageal stricture   . Family history of ovarian cancer   . Fatigue   . Glaucoma   . Heart burn   . Hiatal hernia   . History of echocardiogram    Echo 3/19:  Mild LVH, EF 60-65, no RWMA, Gr 1 DD, PASP 34  . HLD (hyperlipidemia)   . Hypertension   . Joint stiffness of hand   . Myocardial infarction ([place])    2004, stent  . Urinary incontinence   . Vitamin D deficiency     Patient Active Problem List   Diagnosis Date Noted  . Genetic testing 07/05/2019  . Family history of ovarian cancer   . 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 Dysphagia, unspecified(787.20) 10/23/2010  . Abdominal pain, unspecified site 10/23/2010  . Stricture esophagus 10/23/2010  . Irritable bowel syndrome (IBS) 10/23/2010  . Constipation,  chronic 10/23/2010  . Syncope 10/16/2010  . GERD (gastroesophageal reflux disease) 09/11/2010  . Chest pain, unspecified 09/11/2010  . ABSCESS 05/23/2010  . ACUTE CYSTITIS 05/31/2009  . SHOULDER PAIN, LEFT 05/31/2009  . POSTMENOPAUSAL STATUS 05/31/2009  . CONSTIPATION 11/03/2008  . CONTUSION, ARM 07/20/2008  . JOINT STIFFNESS, HAND 06/15/2008  . URINARY INCONTINENCE 06/15/2008  . VITAMIN D DEFICIENCY 03/20/2008  . ARTHRITIS 03/14/2008  . ANEMIA 03/08/2008  . Type 2 diabetes mellitus without complication, without long-term current use of insulin ([place]) 12/11/2006  . DIABETIC  RETINOPATHY 12/11/2006  . HLD (hyperlipidemia) 12/11/2006  . CATARACT, DIABETIC 12/11/2006  . Essential hypertension 12/11/2006  . Coronary artery disease involving native coronary artery of native heart without angina pectoris 12/11/2006  . FATIGUE 12/11/2006    Past Surgical History:  Procedure Laterality Date  . adenosine cardillite  05/16/05   negative signif. myocardial ischemic  . BREAST LUMPECTOMY WITH RADIOACTIVE SEED LOCALIZATION Right 07/29/2019   Procedure: RIGHT BREAST LUMPECTOMY WITH RADIOACTIVE SEED LOCALIZATION;  Surgeon: [name], MD;  Location: [facility];  Service: General;  Laterality: Right;  . CARDIAC CATHETERIZATION  10/04   s/p stent LV 45%  . CATARACT EXTRACTION  bilateral  . CESAREAN SECTION  84, 86   ectopic pregnancy  . CHOLECYSTECTOMY  1998  . CORONARY ANGIOPLASTY WITH STENT PLACEMENT  10/04  . PARTIAL HYSTERECTOMY    . RE-EXCISION OF BREAST LUMPECTOMY Right 08/31/2019   Procedure: RE-EXCISION OF RIGHT BREAST LUMPECTOMY;  Surgeon: [name], MD;  Location: [facility];  Service: General;  Laterality: Right;     OB History   No obstetric history on file.     Family History  Problem Relation Age of Onset  . Heart disease Mother   . Heart attack Father   . Diabetes Other   . Crohn's disease Brother   . Crohn's disease Brother   . Crohn's disease  Brother   . Ovarian cancer Cousin 2  . Colon cancer Neg Hx     Social History   Tobacco Use  . Smoking status: Former Smoker    Packs/day: 0.25    Years: 15.00    Pack years: 3.75    Quit date: 03/31/1990    Years since quitting: 30.3  . Smokeless tobacco: Never Used  Substance Use Topics  . Alcohol use: No    Comment: ocassional  . Drug use: No    Home Medications Prior to Admission medications   Medication Sig Start Date End Date Taking? Authorizing Provider  Accu-Chek FastClix Lancets MISC 1 each by Other route 3 (three) times daily. 08/19/19   [provider]  ACCU-CHEK GUIDE test strip 1 each by Other route as needed. 12/15/19   [provider]  acetaminophen (TYLENOL) 500 MG tablet Take 500-1,000 mg by mouth every 6 (six) hours as needed for moderate pain.     [provider]  albuterol (PROVENTIL HFA;VENTOLIN HFA) 108 (90 Base) MCG/ACT inhaler Inhale 1-2 puffs into the lungs every 4 (four) hours as needed for wheezing or shortness of breath. 02/22/18   Wynona Luna, MD  amLODipine (NORVASC) 10 MG tablet Take 1 tablet by mouth daily. 07/07/20   [provider]  aspirin 81 MG EC tablet Take 81 mg by mouth every morning.     [provider]  Cholecalciferol (D3 HIGH POTENCY) 50 MCG (2000 UT) CAPS Take 2,000 Units by mouth daily.     [provider]  ferrous sulfate 325 (65 FE) MG tablet Take 325 mg by mouth daily with breakfast.    [provider]  gabapentin (NEURONTIN) 300 MG capsule Take 300 mg by mouth 2 (two) times daily.    [provider]  glipiZIDE (GLUCOTROL) 5 MG tablet Take 5 mg by mouth daily. 07/05/20   [provider]  lisinopril-hydrochlorothiazide (PRINZIDE,ZESTORETIC) 20-12.5 MG tablet Take 1 tablet by mouth daily.    [provider]  lovastatin (MEVACOR) 10 MG tablet Take 10 mg by mouth at bedtime.    [provider]  MYRBETRIQ 25 MG TB24 tablet Take 25 mg by  mouth daily.  04/25/19   [provider]  nitroGLYCERIN (NITROSTAT) 0.4 MG SL tablet Place 0.4 mg under the tongue every 5 (five) minutes as needed for chest pain (3 doses max).     [provider]  TURMERIC PO Take 1 capsule by mouth daily as needed (arthritis pain).    [provider]    Allergies    Penicillins, Ibuprofen, Lactose intolerance (gi), Milk-related compounds, Nyquil multi-symptom [pseudoeph-doxylamine-dm-apap], and Latex  Review of Systems   Review of Systems  Gastrointestinal: Positive for abdominal pain, diarrhea and hematochezia.  All other systems reviewed and are  negative.   Physical Exam Updated Vital Signs BP 134/80   Pulse 82   Temp 98.6 F (37 C) (Oral)   Resp (!) 24   Ht 4\' 11"  (1.499 m)   Wt 68.9 kg   SpO2 99%   BMI 30.70 kg/m   Physical Exam Vitals and nursing note reviewed.  Constitutional:      Appearance: Normal appearance.     Comments: Slightly dehydrated  HENT:     Head: Normocephalic.     Nose: Nose normal.     Mouth/Throat:     Mouth: Mucous membranes are dry.  Eyes:     Extraocular Movements: Extraocular movements intact.     Pupils: Pupils are equal, round, and reactive to light.  Cardiovascular:     Rate and Rhythm: Normal rate and regular rhythm.     Pulses: Normal pulses.     Heart sounds: Normal heart sounds.  Pulmonary:     Effort: Pulmonary effort is normal.     Breath sounds: Normal breath sounds.  Abdominal:     General: Abdomen is flat.     Palpations: Abdomen is soft.     Comments: Mild diffuse lower abdominal tenderness no rebound  Genitourinary:    Comments: Rectal-brown stool Musculoskeletal:        General: Normal range of motion.     Cervical back: Normal range of motion and neck supple.  Skin:    General: Skin is warm.     Capillary Refill: Capillary refill takes less than 2 seconds.  Neurological:     General: No focal deficit present.     Mental Status: She is alert.   Psychiatric:        Mood and Affect: Mood normal.        Behavior: Behavior normal.     ED Results / Procedures / Treatments   Labs (all labs ordered are listed, but only abnormal results are displayed) Labs Reviewed  CBC WITH DIFFERENTIAL/PLATELET - Abnormal; Notable for the following components:      Result Value   Platelets 148 (*)    All other components within normal limits  COMPREHENSIVE METABOLIC PANEL - Abnormal; Notable for the following components:   Glucose, Bld 119 (*)    BUN 25 (*)    Creatinine, Ser 1.62 (*)    GFR, Estimated 32 (*)    All other components within normal limits  URINALYSIS, ROUTINE W REFLEX MICROSCOPIC - Abnormal; Notable for the following components:   APPearance HAZY (*)    Ketones, ur 5 (*)    All other components within normal limits  RESP PANEL BY RT-PCR (FLU A&B, COVID) ARPGX2  CULTURE, BLOOD (ROUTINE X 2)  CULTURE, BLOOD (ROUTINE X 2)  LIPASE, BLOOD  LACTIC ACID, PLASMA  LACTIC ACID, PLASMA  POC OCCULT BLOOD, ED    EKG None  Radiology CT ABDOMEN PELVIS WO CONTRAST  Result Date: 08/07/2020 CLINICAL DATA:  Lower abdominal pain and blood in stool. Nausea and vomiting. EXAM: CT ABDOMEN AND PELVIS WITHOUT CONTRAST TECHNIQUE: Multidetector CT imaging of the abdomen and pelvis was performed following the standard protocol without IV contrast. COMPARISON:  Remote CT 06/27/2010 FINDINGS: Lower chest: Minor linear atelectasis or scarring in the right middle lobe. Normal heart size with coronary artery calcifications. Right axillary surgical clips are partially included. Hepatobiliary: Bilobed 6.2 cm low-density lesion in the anterior right lobe of the liver measures simple fluid density and is consistent with cyst. No evidence of solid or suspicious liver lesion.  Clips in the gallbladder fossa postcholecystectomy. No biliary dilatation. Pancreas: No ductal dilatation or inflammation. Spleen: Normal in size without focal abnormality. Adrenals/Urinary  Tract: 16 mm left adrenal adenoma, stable from remote prior exam, benign. Normal right adrenal gland. Slight right renal atrophy. Cyst in the lower pole of the right kidney. No hydronephrosis. Punctate nonobstructing stone in the lower right kidney. No evidence of solid renal lesion. Nondistended bladder. Stomach/Bowel: Fluid-filled descending colon with colonic wall thickening and pericolonic edema typical of colitis. No evidence of pneumatosis or luminal narrowing. Decompressed stomach without evidence of wall thickening. Decompressed small bowel. No obstruction. There is a 2.6 cm fat density lesion in the pelvis that appears to be intimately related to adjacent bowel loops, also seen on prior exam, probable small bowel lipoma. Normal appendix. Small volume formed stool in the proximal colon. No significant diverticular disease. Vascular/Lymphatic: Aorto bi-iliac atherosclerosis. No aneurysm. Retroaortic left renal vein. There is no bulky abdominopelvic adenopathy. Reproductive: Anteverted uterus.  No adnexal mass. Other: No ascites, free air or focal fluid collection. Fat containing left inguinal hernia. Minimal fat in the right inguinal canal. Musculoskeletal: Multilevel degenerative change in the spine with scoliosis and degenerative disc disease. There are no acute or suspicious osseous abnormalities. IMPRESSION: 1. Colitis involving the descending colon, likely infectious or inflammatory. 2. Stable left adrenal adenoma. 3. Punctate nonobstructing stone in the lower right kidney. 4. Additional incidental findings as described above. Aortic Atherosclerosis (ICD10-I70.0). Electronically Signed   By: Keith Rake M.D.   On: 08/07/2020 18:46    Procedures Procedures   Medications Ordered in ED Medications  sodium chloride 0.9 % bolus 1,000 mL (0 mLs Intravenous Stopped 08/07/20 2211)  ciprofloxacin (CIPRO) IVPB 400 mg (400 mg Intravenous New Bag/Given 08/07/20 2155)  metroNIDAZOLE (FLAGYL) IVPB 500 mg  (0 mg Intravenous Stopped 08/07/20 2154)    ED Course  I have reviewed the triage vital signs and the nursing notes.  Pertinent labs & imaging results that were available during my care of the patient were reviewed by me and considered in my medical decision making (see chart for details).    MDM Rules/Calculators/A&P                         Evelyn Morrison is a 80 y.o. female who presenting with diarrhea and 2 episodes of blood in the stool.  Consider cellulitis versus diverticulitis.  Patient is not on any blood thinners.  Patient is hemodynamically stable.  Will hydrate and get CBC and CMP and CT abdomen pelvis and reassess.  11:01 PM Patient's hemoglobin is stable.  Guaiac is negative. Her creatinine is slightly elevated to 1.6 with baseline of 1.5.  Her CT showed colitis.  Given Cipro and Flagyl and will discharge home with course of Cipro and Flagyl.   Final Clinical Impression(s) / ED Diagnoses Final diagnoses:  None    Rx / DC Orders ED Discharge Orders    None       Drenda Freeze, MD 08/07/20 [phone]

## 2020-08-07 NOTE — Discharge Instructions (Signed)
You have mild colitis.  Please take Cipro and Flagyl for a week.  Stay hydrated.  Repeat kidney function in a week  Follow-up with your primary care doctor and GI doctor.  Expect to have some cramps and blood in your stool  Return to ER if you have worse abdominal pain, vomiting, fever, uncontrolled bleeding

## 2020-08-07 NOTE — ED Triage Notes (Signed)
Pt reports lower abdominal pain, nausea, vomiting, diarrhea and dark red blood in the toilet x 1 day. Tylenol gives no relief, last dose last night.

## 2020-08-12 LAB — CULTURE, BLOOD (ROUTINE X 2): Culture: NO GROWTH

## 2020-08-13 ENCOUNTER — Encounter: Payer: Self-pay | Admitting: Nurse Practitioner

## 2020-08-23 DIAGNOSIS — C50911 Malignant neoplasm of unspecified site of right female breast: Secondary | ICD-10-CM | POA: Diagnosis not present

## 2020-08-30 DIAGNOSIS — I25118 Atherosclerotic heart disease of native coronary artery with other forms of angina pectoris: Secondary | ICD-10-CM | POA: Diagnosis not present

## 2020-08-30 DIAGNOSIS — E119 Type 2 diabetes mellitus without complications: Secondary | ICD-10-CM | POA: Diagnosis not present

## 2020-08-30 DIAGNOSIS — E1142 Type 2 diabetes mellitus with diabetic polyneuropathy: Secondary | ICD-10-CM | POA: Diagnosis not present

## 2020-08-30 DIAGNOSIS — D500 Iron deficiency anemia secondary to blood loss (chronic): Secondary | ICD-10-CM | POA: Diagnosis not present

## 2020-08-30 DIAGNOSIS — K529 Noninfective gastroenteritis and colitis, unspecified: Secondary | ICD-10-CM | POA: Diagnosis not present

## 2020-08-30 DIAGNOSIS — N1831 Chronic kidney disease, stage 3a: Secondary | ICD-10-CM | POA: Diagnosis not present

## 2020-08-30 DIAGNOSIS — E782 Mixed hyperlipidemia: Secondary | ICD-10-CM | POA: Diagnosis not present

## 2020-08-30 DIAGNOSIS — I10 Essential (primary) hypertension: Secondary | ICD-10-CM | POA: Diagnosis not present

## 2020-08-30 DIAGNOSIS — Z0001 Encounter for general adult medical examination with abnormal findings: Secondary | ICD-10-CM | POA: Diagnosis not present

## 2020-09-05 ENCOUNTER — Ambulatory Visit: Payer: Medicare HMO | Admitting: Nurse Practitioner

## 2020-09-24 ENCOUNTER — Other Ambulatory Visit: Payer: Self-pay

## 2020-09-24 ENCOUNTER — Other Ambulatory Visit (INDEPENDENT_AMBULATORY_CARE_PROVIDER_SITE_OTHER): Payer: Medicare HMO

## 2020-09-24 ENCOUNTER — Ambulatory Visit (INDEPENDENT_AMBULATORY_CARE_PROVIDER_SITE_OTHER): Payer: Medicare HMO | Admitting: Nurse Practitioner

## 2020-09-24 VITALS — BP 112/80 | HR 95 | Ht 60.0 in | Wt 149.6 lb

## 2020-09-24 DIAGNOSIS — Z1211 Encounter for screening for malignant neoplasm of colon: Secondary | ICD-10-CM | POA: Diagnosis not present

## 2020-09-24 DIAGNOSIS — K529 Noninfective gastroenteritis and colitis, unspecified: Secondary | ICD-10-CM

## 2020-09-24 DIAGNOSIS — D509 Iron deficiency anemia, unspecified: Secondary | ICD-10-CM | POA: Diagnosis not present

## 2020-09-24 LAB — BASIC METABOLIC PANEL
BUN: 30 mg/dL — ABNORMAL HIGH (ref 6–23)
CO2: 25 mEq/L (ref 19–32)
Calcium: 10 mg/dL (ref 8.4–10.5)
Chloride: 104 mEq/L (ref 96–112)
Creatinine, Ser: 1.61 mg/dL — ABNORMAL HIGH (ref 0.40–1.20)
GFR: 30.41 mL/min — ABNORMAL LOW (ref >60.00)
Glucose, Bld: 98 mg/dL (ref 70–99)
Potassium: 4 mEq/L (ref 3.5–5.1)
Sodium: 139 mEq/L (ref 135–145)

## 2020-09-24 LAB — CBC WITH DIFFERENTIAL/PLATELET
Basophils Absolute: 0 10*3/uL (ref 0.0–0.1)
Basophils Relative: 0.9 % (ref 0.0–3.0)
Eosinophils Absolute: 0.3 10*3/uL (ref 0.0–0.7)
Eosinophils Relative: 6.1 % — ABNORMAL HIGH (ref 0.0–5.0)
HCT: 36.4 % (ref 36.0–46.0)
Hemoglobin: 12.2 g/dL (ref 12.0–15.0)
Lymphocytes Relative: 26.9 % (ref 12.0–46.0)
Lymphs Abs: 1.3 10*3/uL (ref 0.7–4.0)
MCHC: 33.6 g/dL (ref 30.0–36.0)
MCV: 91.9 fl (ref 78.0–100.0)
Monocytes Absolute: 0.6 10*3/uL (ref 0.1–1.0)
Monocytes Relative: 11.4 % (ref 3.0–12.0)
Neutro Abs: 2.7 10*3/uL (ref 1.4–7.7)
Neutrophils Relative %: 54.7 % (ref 43.0–77.0)
Platelets: 160 10*3/uL (ref 150.0–400.0)
RBC: 3.97 Mil/uL (ref 3.87–5.11)
RDW: 14.7 % (ref 11.5–15.5)
WBC: 4.9 10*3/uL (ref 4.0–10.5)

## 2020-09-24 NOTE — Patient Instructions (Signed)
If you are age 79 or older, your body mass index should be between 23-30. Your Body mass index is 29.22 kg/m. If this is out of the aforementioned range listed, please consider follow up with your Primary Care Provider.  PROCEDURES: You have been scheduled for a colonoscopy. Please follow the written instructions given to you at your visit today. If you use inhalers (even only as needed), please bring them with you on the day of your procedure.  LABS:  Lab work has been ordered for you today. Our lab is located in the basement. Press "B" on the elevator. The lab is located at the first door on the left as you exit the elevator.  HEALTHCARE LAWS AND MY CHART RESULTS: Due to recent changes in healthcare laws, you may see the results of your imaging and laboratory studies on MyChart before your provider has had a chance to review them.   We understand that in some cases there may be results that are confusing or concerning to you. Not all laboratory results come back in the same time frame and the provider may be waiting for multiple results in order to interpret others.  Please give [place] 48 hours in order for your provider to thoroughly review all the results before contacting the office for clarification of your results.   Please call our office if the abdominal pain or diarrhea recurs. It was great seeing you today! Thank you for entrusting me with your care and choosing [facility].  Noralyn Pick, CRNP  The [facility] providers would like to encourage you to use [facility] to communicate with providers for non-urgent requests or questions.  Due to long hold times on the telephone, sending your provider a message by [facility] may be faster and more efficient way to get a response. Please allow 48 business hours for a response.  Please remember that this is for non-urgent requests/questions.

## 2020-09-24 NOTE — Progress Notes (Signed)
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  Past cholecystectomy and partial hysterectomy.    She presents to our office today for follow up regarding colitis. She developed N/V, lower abdominal pain and 6 episodes of diarrhea, 2 of the episodes of diarrhea were bloody. She presented to the urgent care clinic on 08/07/2020 then was transported to [facility] for further evaluation and CT imaging.  Labs in the ED showed a BUN level 25.  Creatinine 1.62 (Cr level 1.31 on 08/25/2019).  Sodium 140.  Potassium 4.1.  Glucose 119.  AST 27.  ALT 22.  Alk phos 56.  Total bili 0.8.  WBC 8.1.  Hemoglobin 13.3.  Hematocrit 41.7.  Platelet 148.  FOBT was negative.  CTAP without contrast showed colitis involving the descending colon, likely infectious versus inflammatory.  A stable left adrenal adenoma and a nonobstructing right kidney stone was noted.  She received metronidazole 500 mg IV and Cipr 400 mg IV in the ED.  She was discharged home on Cipro 500 mg 1 p.o. twice daily and Metronidazole 500 mg 1 p.o. twice daily for 7 days and her abdominal pain and bloody diarrhea abated.  She denies having any further abdominal pain.  She is passing a normal formed bowel movement every other day.  No rectal bleeding or black stools.  She takes Metamucil every other day.  She underwent a colonoscopy by Dr. Sharlett Iles 10/23/2010 which was normal, no colon polyps.  She complains of fatigue and urinary incontinence.  No GERD symptoms.  She takes ferrous sulfate 325 mg once daily.  No other complaints at this time.  No family history of colon cancer.  She reports 4 of her brothers had Crohn's disease.  She has a history of CAD s/p MI with DES in 2004 on ASA, last seen by her cardiologist Dr. Sherren Mocha 12/2019, her exam was stable at that time with instructions to follow up in one year.  She denies having any chest pain, palpitations or shortness of breath.  Echo 05/29/2017 LVEF 60 to 65% and grade 1 diastolic dysfunction.  Lexiscan 2019 without evidence of ischemia/infarction with normal LVEF.   CTAP wo contrast 08/08/2019: 1. Colitis involving the descending colon, likely infectious or inflammatory. 2. Stable left adrenal adenoma. 3. Punctate nonobstructing stone in the lower right kidney. 4. Additional incidental findings as described above.  Aortic Atherosclerosis  Past GI procedures: Colonoscopy by Dr. Sharlett Iles 10/23/2010: Normal, no colon polyps   EGD 10/23/2010: 1.  Normal duodenal folds 2.  Moderate gastritis in the total stomach 3.  Stricture in the total esophagus 4.  A hiatal hernia 1. Surgical [P], small bowel, bx - BENIGN SMALL BOWEL WITH NO SIGNIFICANT HISTOPATHOLOGIC CHANGES. 2. Surgical [P], gastric, bx - SEVERE CHRONIC GASTRITIS WITH FOCAL ACTIVE CHANGE. - WARTHIN-STARRY STAIN NEGATIVE FOR H. PYLORI. - INTESTINAL METAPLASIA PRESENT.    Past Medical History:  Diagnosis Date   Anemia    Anxiety    Arthritis    CAD (coronary artery disease)    Myoview 12/17: EF 66, apical and apical lateral defect - likely attenuation artifact, no ischemia; Low  Risk // Myoview 3/19:  EF 74, no ischemia or scar; low risk    Cataract, diabetic (HCC)    Chest pain    Chronic kidney disease    stage 3   Constipation    Contusion of arm    Diabetes mellitus    Diabetic retinopathy    DM (diabetes mellitus) ([place])    Dyspnea    SOb on exertion   Esophageal stricture    Family history of ovarian cancer    Fatigue    Glaucoma    Heart burn    Hiatal hernia    History of echocardiogram     Echo 3/19:  Mild LVH, EF 60-65, no RWMA, Gr 1 DD, PASP 34   HLD (hyperlipidemia)    Hypertension    Joint stiffness of hand    Myocardial infarction ([place])    2004, stent   Urinary incontinence    Vitamin D deficiency    Past Surgical History:  Procedure Laterality Date   adenosine cardillite  05/16/05   negative signif. myocardial ischemic   BREAST LUMPECTOMY WITH RADIOACTIVE SEED LOCALIZATION Right 07/29/2019   Procedure: RIGHT BREAST LUMPECTOMY WITH RADIOACTIVE SEED LOCALIZATION;  Surgeon: [name], MD;  Location: [place];  Service: General;  Laterality: Right;   CARDIAC CATHETERIZATION  10/04   s/p stent LV 45%   CATARACT EXTRACTION     bilateral   CESAREAN SECTION  84, 86   ectopic pregnancy   CHOLECYSTECTOMY  1998   CORONARY ANGIOPLASTY WITH STENT PLACEMENT  10/04   PARTIAL HYSTERECTOMY     RE-EXCISION OF BREAST LUMPECTOMY Right 08/31/2019   Procedure: RE-EXCISION OF RIGHT BREAST LUMPECTOMY;  Surgeon: [name], MD;  Location: [place];  Service: General;  Laterality: Right;    Social History: She is a retired Audiological scientist.  She has 2 sons (one deceased) and 3 daughters.  Past smoker, quit smoking 30 years ago.  No alcohol use.  No drug use.  Family History: Father with history of MI.  Mother with history of heart disease.  Four brothers with Crohn's disease, 2 deceased and 2 are living.  No known family history of esophageal, gastric or colon cancer.  Allergies  Allergen Reactions   Penicillins Hives, Palpitations and Other (See Comments)    Mouth foams, also  Has patient had a PCN reaction causing immediate rash, facial/tongue/throat swelling, SOB or lightheadedness with hypotension: Yes Has patient had a PCN reaction causing severe rash involving mucus membranes or skin necrosis: No Has patient had a PCN reaction that required hospitalization: No Has patient had a PCN reaction occurring within the last 10 years: No If all of the above  answers are "NO", then may proceed with Cephalosporin use.    Ibuprofen Other (See Comments)    Made the patient shake and reacts with some of her meds   Lactose Intolerance (Gi) Other (See Comments)    Stomach pain   Milk-Related Compounds Other (See Comments)    Causes a stomach ache   Nyquil Multi-Symptom [Pseudoeph-Doxylamine-Dm-Apap] Other (See Comments)    Made the patient shake (tolerates Coricidin)   Latex Rash    No "black" latex      Outpatient Encounter Medications as of 09/24/2020  Medication Sig   Accu-Chek FastClix Lancets MISC 1 each by Other route 3 (three) times daily.   ACCU-CHEK GUIDE test strip 1 each by Other route as needed.   acetaminophen (TYLENOL) 500 MG tablet Take 500-1,000 mg by mouth every 6 (  six) hours as needed for moderate pain.    albuterol (PROVENTIL HFA;VENTOLIN HFA) 108 (90 Base) MCG/ACT inhaler Inhale 1-2 puffs into the lungs every 4 (four) hours as needed for wheezing or shortness of breath.   amLODipine (NORVASC) 10 MG tablet Take 1 tablet by mouth daily.   aspirin 81 MG EC tablet Take 81 mg by mouth every morning.    Cholecalciferol (D3 HIGH POTENCY) 50 MCG (2000 UT) CAPS Take 2,000 Units by mouth daily.    ciprofloxacin (CIPRO) 500 MG tablet Take 1 tablet (500 mg total) by mouth 2 (two) times daily. One po bid x 7 days   ferrous sulfate 325 (65 FE) MG tablet Take 325 mg by mouth daily with breakfast.   gabapentin (NEURONTIN) 300 MG capsule Take 300 mg by mouth 2 (two) times daily.   glipiZIDE (GLUCOTROL) 5 MG tablet Take 5 mg by mouth daily.   lisinopril-hydrochlorothiazide (PRINZIDE,ZESTORETIC) 20-12.5 MG tablet Take 1 tablet by mouth daily.   lovastatin (MEVACOR) 10 MG tablet Take 10 mg by mouth at bedtime.   metroNIDAZOLE (FLAGYL) 500 MG tablet Take 1 tablet (500 mg total) by mouth 2 (two) times daily. One po bid x 7 days   MYRBETRIQ 25 MG TB24 tablet Take 25 mg by mouth daily.    nitroGLYCERIN (NITROSTAT) 0.4 MG SL tablet Place 0.4 mg under  the tongue every 5 (five) minutes as needed for chest pain (3 doses max).    TURMERIC PO Take 1 capsule by mouth daily as needed (arthritis pain).   No facility-administered encounter medications on file as of 09/24/2020.    REVIEW OF SYSTEMS:  Gen: Denies fever, sweats or chills. No weight loss.  CV: Denies chest pain, palpitations or edema. Resp: Denies cough, shortness of breath of hemoptysis.  GI: See HPI.   GU : + Urinary incontinence. MS: + Arthritis to left arm, shoulder and knee.  Muscle cramps in legs. Derm: Denies rash, itchiness, skin lesions or unhealing ulcers. Psych: Denies depression, anxiety, memory loss, suicidal ideation and confusion. Heme: Denies bruising, bleeding. Neuro:  Denies headaches, dizziness or paresthesias. Endo:  + Diabetes.   PHYSICAL EXAM: BP 112/80   Pulse 95   Ht 5' (1.524 m)   Wt 149 lb 9.6 oz (67.9 kg)   BMI 29.22 kg/m   Wt Readings from Last 3 Encounters:  09/24/20 149 lb 9.6 oz (67.9 kg)  08/07/20 152 lb (68.9 kg)  01/20/20 152 lb 12.8 oz (69.3 kg)    General: 80 year old female in no acute distress. Head: Normocephalic and atraumatic. Eyes:  Sclerae non-icteric, conjunctive pink. Ears: Normal auditory acuity. Mouth: Dentition intact. No ulcers or lesions.  Neck: Supple, no lymphadenopathy or thyromegaly.  Lungs: Clear bilaterally to auscultation without wheezes, crackles or rhonchi. Heart: Regular rate and rhythm. No murmur, rub or gallop appreciated.  Abdomen: Soft, nontender, non distended. No masses. No hepatosplenomegaly. Mid line lower abdominal scar intact. Normoactive bowel sounds x 4 quadrants.  Rectal: Deferred. Musculoskeletal: Symmetrical with no gross deformities. Skin: Warm and dry. No rash or lesions on visible extremities. Extremities: No edema. Neurological: Alert oriented x 4, no focal deficits.  Psychological:  Alert and cooperative. Normal mood and affect.  ASSESSMENT AND PLAN:  43.  80 year old female with  N/V, lower abdominal pain with bloody diarrhea diagnosed with infectious vs inflammatory colitis to the descending colon (per CTAP 08/07/2020, resolved after course of Cipro and Flagyl 558m po bid x 7 days without recurrence, likely self limited infectious colitis. Normal colonoscopy  in 2012. Significant family history of Crohn's disease.  -I discussed scheduling a diagnostic colonoscopy to rule out IBD or colon cancer.  I discussed the benefits and risks including risk with sedation, risk of bleeding, perforation and infection. She wishes to purse a colonoscopy and she accepts these risks.  -She will call our office if her lower abdominal pain or bloody diarrhea recurs -Further follow-up to be determined after colonoscopy completed -Patient to hold ferrous sulfate for 1 week prior to her colonoscopy date, to restart after colonoscopy completed  2. Coronary artery disease s/p MI and DES in 2004 followed by cardiologist Dr. Burt Knack on ASA  3. History of DCIS s/p right breast lumpectomy and radiation 2021  4. Chronic anemia, primarily due to CKD stage.  She is on ferrous sulfate 325 mg once daily. -CBC, iron, iron saturation, TIBC and ferritin level  5. CKD stage III  6. DM II    CC:  Benito Mccreedy, MD

## 2020-09-25 ENCOUNTER — Telehealth: Payer: Self-pay

## 2020-09-25 ENCOUNTER — Encounter (HOSPITAL_COMMUNITY): Payer: Self-pay

## 2020-09-25 LAB — IRON,TIBC AND FERRITIN PANEL
%SAT: 20 % (calc) (ref 16–45)
Ferritin: 227 ng/mL (ref 16–288)
Iron: 65 ug/dL (ref 45–160)
TIBC: 331 mcg/dL (calc) (ref 250–450)

## 2020-09-25 NOTE — Telephone Encounter (Signed)
-----   Message from Noralyn Pick, NP sent at 09/25/2020  5:30 AM EDT ----- Lenna Sciara, pls inform patient her iron levels were normal, labs were stable. Patient to follow up with her PCP and nephrologist regarding chronic elevated creatinine level. Thx

## 2020-09-25 NOTE — Telephone Encounter (Signed)
Patient notified

## 2020-10-03 NOTE — Progress Notes (Signed)
Agree with above assessment and plan 

## 2020-10-18 ENCOUNTER — Ambulatory Visit (AMBULATORY_SURGERY_CENTER): Payer: Medicare HMO | Admitting: Gastroenterology

## 2020-10-18 ENCOUNTER — Encounter: Payer: Self-pay | Admitting: Gastroenterology

## 2020-10-18 ENCOUNTER — Other Ambulatory Visit: Payer: Self-pay

## 2020-10-18 VITALS — BP 112/70 | HR 70 | Temp 97.1°F | Resp 17 | Ht 60.0 in | Wt 149.0 lb

## 2020-10-18 DIAGNOSIS — D123 Benign neoplasm of transverse colon: Secondary | ICD-10-CM | POA: Diagnosis not present

## 2020-10-18 DIAGNOSIS — K529 Noninfective gastroenteritis and colitis, unspecified: Secondary | ICD-10-CM

## 2020-10-18 DIAGNOSIS — D122 Benign neoplasm of ascending colon: Secondary | ICD-10-CM | POA: Diagnosis not present

## 2020-10-18 DIAGNOSIS — Z1211 Encounter for screening for malignant neoplasm of colon: Secondary | ICD-10-CM

## 2020-10-18 DIAGNOSIS — K5730 Diverticulosis of large intestine without perforation or abscess without bleeding: Secondary | ICD-10-CM | POA: Diagnosis not present

## 2020-10-18 MED ORDER — SODIUM CHLORIDE 0.9 % IV SOLN: 500.0000 mL | Freq: Once | INTRAVENOUS | Status: AC

## 2020-10-18 NOTE — Progress Notes (Signed)
VS by CW  I have reviewed the patient's medical history in detail and updated the computerized patient record.  Pt extremely nauseated this morning.

## 2020-10-18 NOTE — Progress Notes (Signed)
Called to room to assist during endoscopic procedure.  Patient ID and intended procedure confirmed with present staff. Received instructions for my participation in the procedure from the performing physician.  

## 2020-10-18 NOTE — Patient Instructions (Signed)
YOU HAD AN ENDOSCOPIC PROCEDURE TODAY AT THE [facility]:   Refer to the procedure report that was given to you for any specific questions about what was found during the examination.  If the procedure report does not answer your questions, please call your gastroenterologist to clarify.  If you requested that your care partner not be given the details of your procedure findings, then the procedure report has been included in a sealed envelope for you to review at your convenience later.  YOU SHOULD EXPECT: Some feelings of bloating in the abdomen. Passage of more gas than usual.  Walking can help get rid of the air that was put into your GI tract during the procedure and reduce the bloating. If you had a lower endoscopy (such as a colonoscopy or flexible sigmoidoscopy) you may notice spotting of blood in your stool or on the toilet paper. If you underwent a bowel prep for your procedure, you may not have a normal bowel movement for a few days.  Please Note:  You might notice some irritation and congestion in your nose or some drainage.  This is from the oxygen used during your procedure.  There is no need for concern and it should clear up in a day or so.  SYMPTOMS TO REPORT IMMEDIATELY:  Following lower endoscopy (colonoscopy or flexible sigmoidoscopy):  Excessive amounts of blood in the stool  Significant tenderness or worsening of abdominal pains  Swelling of the abdomen that is new, acute  Fever of 100F or higher   For urgent or emergent issues, a gastroenterologist can be reached at any hour by calling [phone]. Do not use MyChart messaging for urgent concerns.    DIET:  We do recommend a small meal at first, but then you may proceed to your regular diet.  Drink plenty of fluids but you should avoid alcoholic beverages for 24 hours.  MEDICATIONS:  Continue present medications.  Please see handouts given to you by your recovery nurse.  Thank you for allowing us to  provide for your healthcare needs today.  ACTIVITY:  You should plan to take it easy for the rest of today and you should NOT DRIVE or use heavy machinery until tomorrow (because of the sedation medicines used during the test).    FOLLOW UP: Our staff will call the number listed on your records 48-72 hours following your procedure to check on you and address any questions or concerns that you may have regarding the information given to you following your procedure. If we do not reach you, we will leave a message.  We will attempt to reach you two times.  During this call, we will ask if you have developed any symptoms of COVID 19. If you develop any symptoms (ie: fever, flu-like symptoms, shortness of breath, cough etc.) before then, please call [phone].  If you test positive for Covid 19 in the 2 weeks post procedure, please call and report this information to us.    If any biopsies were taken you will be contacted by phone or by letter within the next 1-3 weeks.  Please call us at [phone] if you have not heard about the biopsies in 3 weeks.    SIGNATURES/CONFIDENTIALITY: You and/or your care partner have signed paperwork which will be entered into your electronic medical record.  These signatures attest to the fact that that the information above on your After Visit Summary has been reviewed and is understood.  Full responsibility of the   confidentiality of this discharge information lies with you and/or your care-partner.  

## 2020-10-18 NOTE — Op Note (Signed)
[facility] Patient Name: Evelyn Morrison Procedure Date: 10/18/2020 7:22 AM MRN: [id number] Endoscopist: Nicki Reaper E. Candis Schatz , MD Age: 80 Referring MD:  Date of Birth: 05-12-1940 Gender: Female Account #: [id number] Procedure:                Colonoscopy Indications:              Follow-up of colitis Medicines:                Monitored Anesthesia Care Procedure:                Pre-Anesthesia Assessment:                           - Prior to the procedure, a History and Physical                            was performed, and patient medications and                            allergies were reviewed. The patient's tolerance of                            previous anesthesia was also reviewed. The risks                            and benefits of the procedure and the sedation                            options and risks were discussed with the patient.                            All questions were answered, and informed consent                            was obtained. Prior Anticoagulants: The patient has                            taken no previous anticoagulant or antiplatelet                            agents except for aspirin. ASA Grade Assessment:                            III - A patient with severe systemic disease. After                            reviewing the risks and benefits, the patient was                            deemed in satisfactory condition to undergo the                            procedure.  After obtaining informed consent, the colonoscope                            was passed under direct vision. Throughout the                            procedure, the patient's blood pressure, pulse, and                            oxygen saturations were monitored continuously. The                            CF [id number] #[id number] was introduced through the anus                            and advanced to the the terminal ileum, with                             identification of the appendiceal orifice and IC                            valve. The colonoscopy was performed without                            difficulty. The patient tolerated the procedure                            well. The quality of the bowel preparation was                            good. The terminal ileum, ileocecal valve,                            appendiceal orifice, and rectum were photographed. Scope In: 8:04:39 AM Scope Out: 8:30:22 AM Scope Withdrawal Time: 0 hours 18 minutes 38 seconds  Total Procedure Duration: 0 hours 25 minutes 43 seconds  Findings:                 The perianal and digital rectal examinations were                            normal. Pertinent negatives include normal                            sphincter tone and no palpable rectal lesions.                           A 3 mm polyp was found in the ascending colon. The                            polyp was sessile. The polyp was removed with a                            cold snare. Resection  and retrieval were complete.                            Estimated blood loss was minimal.                           Two sessile polyps were found in the transverse                            colon. The polyps were 3 to 4 mm in size. These                            polyps were removed with a cold snare. Resection                            and retrieval were complete. Estimated blood loss                            was minimal.                           A few small-mouthed diverticula were found in the                            sigmoid colon.                           The exam was otherwise normal throughout the                            examined colon.                           The terminal ileum appeared normal.                           The retroflexed view of the distal rectum and anal                            verge was normal and showed no anal or rectal                             abnormalities. Complications:            No immediate complications. Estimated Blood Loss:     Estimated blood loss was minimal. Impression:               - One 3 mm polyp in the ascending colon, removed                            with a cold snare. Resected and retrieved.                           - Two 3 to 4 mm polyps in the transverse colon,  removed with a cold snare. Resected and retrieved.                           - Diverticulosis in the sigmoid colon.                           - The examined portion of the ileum was normal.                           - The distal rectum and anal verge are normal on                            retroflexion view.                           - No evidence of chronic colitis Recommendation:           - Patient has a contact number available for                            emergencies. The signs and symptoms of potential                            delayed complications were discussed with the                            patient. Return to normal activities tomorrow.                            Written discharge instructions were provided to the                            patient.                           - Resume previous diet.                           - Continue present medications.                           - Await pathology results.                           - Recommend against further polyp                            surveillance/colonoscopies given low risk polyps                            and advanced age. Zakiah Beckerman E. Candis Schatz, MD 10/18/2020 8:39:02 AM This report has been signed electronically.

## 2020-10-18 NOTE — Progress Notes (Signed)
To PACU, VSS. Report to [email or web address] 

## 2020-10-22 ENCOUNTER — Telehealth: Payer: Self-pay

## 2020-10-22 NOTE — Telephone Encounter (Signed)
  Follow up Call-  Call back number 10/18/2020  Post procedure Call Back phone  # [phone]  Permission to leave phone message No  Some recent data might be hidden     1st follow up call made. NAULM

## 2020-10-22 NOTE — Telephone Encounter (Signed)
  Follow up Call-  Call back number 10/18/2020  Post procedure Call Back phone  # [phone]  Permission to leave phone message No  Some recent data might be hidden     2nd follow up call made.  NALM

## 2020-10-24 ENCOUNTER — Encounter: Payer: Self-pay | Admitting: Gastroenterology

## 2020-11-05 ENCOUNTER — Ambulatory Visit: Payer: Medicare HMO | Admitting: Hematology

## 2020-11-08 ENCOUNTER — Other Ambulatory Visit: Payer: Self-pay

## 2020-11-08 ENCOUNTER — Encounter: Payer: Self-pay | Admitting: Hematology

## 2020-11-08 ENCOUNTER — Inpatient Hospital Stay: Payer: Medicare HMO | Attending: Hematology | Admitting: Hematology

## 2020-11-08 VITALS — BP 124/78 | HR 85 | Temp 98.4°F | Resp 18 | Ht 60.0 in | Wt 149.9 lb

## 2020-11-08 DIAGNOSIS — E1122 Type 2 diabetes mellitus with diabetic chronic kidney disease: Secondary | ICD-10-CM | POA: Diagnosis not present

## 2020-11-08 DIAGNOSIS — R32 Unspecified urinary incontinence: Secondary | ICD-10-CM | POA: Diagnosis not present

## 2020-11-08 DIAGNOSIS — D0511 Intraductal carcinoma in situ of right breast: Secondary | ICD-10-CM | POA: Diagnosis not present

## 2020-11-08 DIAGNOSIS — R252 Cramp and spasm: Secondary | ICD-10-CM | POA: Insufficient documentation

## 2020-11-08 DIAGNOSIS — E785 Hyperlipidemia, unspecified: Secondary | ICD-10-CM | POA: Diagnosis not present

## 2020-11-08 DIAGNOSIS — N183 Chronic kidney disease, stage 3 unspecified: Secondary | ICD-10-CM | POA: Insufficient documentation

## 2020-11-08 DIAGNOSIS — Z7982 Long term (current) use of aspirin: Secondary | ICD-10-CM | POA: Diagnosis not present

## 2020-11-08 DIAGNOSIS — I252 Old myocardial infarction: Secondary | ICD-10-CM | POA: Diagnosis not present

## 2020-11-08 DIAGNOSIS — I2510 Atherosclerotic heart disease of native coronary artery without angina pectoris: Secondary | ICD-10-CM | POA: Insufficient documentation

## 2020-11-08 DIAGNOSIS — I129 Hypertensive chronic kidney disease with stage 1 through stage 4 chronic kidney disease, or unspecified chronic kidney disease: Secondary | ICD-10-CM | POA: Insufficient documentation

## 2020-11-08 DIAGNOSIS — E1136 Type 2 diabetes mellitus with diabetic cataract: Secondary | ICD-10-CM | POA: Insufficient documentation

## 2020-11-08 DIAGNOSIS — E559 Vitamin D deficiency, unspecified: Secondary | ICD-10-CM | POA: Diagnosis not present

## 2020-11-08 DIAGNOSIS — Z79899 Other long term (current) drug therapy: Secondary | ICD-10-CM | POA: Diagnosis not present

## 2020-11-08 DIAGNOSIS — M25512 Pain in left shoulder: Secondary | ICD-10-CM | POA: Diagnosis not present

## 2020-11-08 DIAGNOSIS — M1990 Unspecified osteoarthritis, unspecified site: Secondary | ICD-10-CM | POA: Diagnosis not present

## 2020-11-08 DIAGNOSIS — E11319 Type 2 diabetes mellitus with unspecified diabetic retinopathy without macular edema: Secondary | ICD-10-CM | POA: Diagnosis not present

## 2020-11-08 DIAGNOSIS — Z171 Estrogen receptor negative status [ER-]: Secondary | ICD-10-CM | POA: Insufficient documentation

## 2020-11-08 DIAGNOSIS — Z923 Personal history of irradiation: Secondary | ICD-10-CM | POA: Diagnosis not present

## 2020-11-08 NOTE — Progress Notes (Signed)
[facility]   [phone]) [phone] [phone]) [phone]   Clinic Follow up Note   Patient Care Team: Benito Mccreedy, MD as PCP - General (Internal Medicine) Sherren Mocha, MD as PCP - Cardiology (Cardiology) Mauro Kaufmann, RN as Oncology Nurse Navigator Rockwell Germany, RN as Oncology Nurse Navigator [name], MD as Consulting Physician (General Surgery) Truitt Merle, MD as Consulting Physician (Hematology) Gery Pray, MD as Consulting Physician (Radiation Oncology) Alla Feeling, NP as Nurse Practitioner (Nurse Practitioner)  Date of Service:  11/08/2020  CHIEF COMPLAINT: f/u of right breast DCIS  SUMMARY OF ONCOLOGIC HISTORY: Oncology History Overview Note  Cancer Staging Ductal carcinoma in situ (DCIS) of right breast Staging form: Breast, AJCC 8th Edition - Clinical stage from 06/08/2019: Stage 0 (cTis (DCIS), cN0, cM0, ER-, PR-, HER2: Not Assessed) - Signed by Truitt Merle, MD on 06/21/2019    Ductal carcinoma in situ (DCIS) of right breast  05/30/2019 Mammogram   Diagnostic Mammogram  IMPRESSION The developing round calcifications in the upper outer aspect posterior depth right breast  measuring 0.6cm are suspicious for malignancy. A biopsy is recommended.    06/08/2019 Cancer Staging   Staging form: Breast, AJCC 8th Edition - Clinical stage from 06/08/2019: Stage 0 (cTis (DCIS), cN0, cM0, ER-, PR-, HER2: Not Assessed) - Signed by Truitt Merle, MD on 06/21/2019   06/08/2019 Initial Biopsy   Diagnosis Breast, right, needle core biopsy, calcifications - DUCTAL CARCINOMA IN SITU. Microscopic Comment The DCIS is of intermediate to high nuclear grade with central necrosis and calcifications. Immunohistochemistry for p63, Calponin and SMM-1 demonstrates the presence of myoepithelium; a distinct invasive component is not identified in the submitted material. Ancillary studies will be reported separately. Results reported to [facility] on  06/09/2019.   06/08/2019 Receptors her2   PROGNOSTIC INDICATORS Results: IMMUNOHISTOCHEMICAL AND MORPHOMETRIC ANALYSIS PERFORMED MANUALLY Estrogen Receptor: 0%, NEGATIVE Progesterone Receptor: 0%, NEGATIVE   06/14/2019 Initial Diagnosis   Ductal carcinoma in situ (DCIS) of right breast    Genetic Testing   Negative genetic testing. No pathogenic variants identified on the [facility] Common Hereditary Caners Panel. VUS in APC called c.-30476T>A (Non-coding) and VUS in [place] called c.580G>A (p.Ala194Thr) identified. The report date is 07/04/2019.  The Common Hereditary Cancers Panel offered by [facility] includes sequencing and/or deletion duplication testing of the following 48 genes: APC, ATM, AXIN2, BARD1, BMPR1A, BRCA1, BRCA2, BRIP1, CDH1, CDKN2A (p14ARF), CDKN2A (p16INK4a), CKD4, CHEK2, CTNNA1, DICER1, EPCAM (Deletion/duplication testing only), GREM1 (promoter region deletion/duplication testing only), KIT, MEN1, MLH1, MSH2, MSH3, MSH6, MUTYH, NBN, NF1, NHTL1, PALB2, PDGFRA, PMS2, POLD1, POLE, PTEN, RAD50, RAD51C, RAD51D, RNF43, SDHB, SDHC, SDHD, SMAD4, SMARCA4. STK11, TP53, TSC1, TSC2, and VHL.  The following genes were evaluated for sequence changes only: SDHA and HOXB13 c.251G>A variant only.   07/29/2019 Surgery   RIGHT BREAST LUMPECTOMY WITH RADIOACTIVE SEED LOCALIZATION by Dr Barry Dienes    07/29/2019 Pathology Results   FINAL MICROSCOPIC DIAGNOSIS:   A. BREAST, RIGHT SUPERFICIAL, LUMPECTOMY:  -  No residual carcinoma identified   B. BREAST, RIGHT, LUMPECTOMY:  -  Ductal carcinoma in situ, high grade, 1.2 cm  -  Margins involved by carcinoma (lateral, broadly)  -  Previous biopsy site changes  -  See oncology table and comment below below    08/31/2019 Surgery   RE-EXCISION OF RIGHT BREAST LUMPECTOMY by Dr Barry Dienes    08/31/2019 Pathology Results   FINAL MICROSCOPIC DIAGNOSIS:   A. BREAST, RIGHT LATERAL ASPECT, RE-EXCISION:  -  Previous surgical  site changes with granulation tissue  -   Vessels with organizing thrombi  -  No residual carcinoma identified    10/06/2019 - 11/02/2019 Radiation Therapy   Adjuvant Radiation with Dr Sondra Come    02/27/2020 Survivorship   SCP reviewed by Cira Rue, NP via virtual visit and mailed to patient      CURRENT THERAPY:  Surveillance  INTERVAL HISTORY:  Evelyn Morrison is here for a follow up of DCIS. She was last seen by me on 10/31/19. She presents to the clinic alone. She reports she is still recovering from her bout of colitis in 07/2020 and subsequent colonoscopy in 09/2020. She reports she underwent mammogram in 05/2020, and it was normal. She reports stiffness and pain to her left shoulder. She notes she was told to take tylenol, but this has not helped.   All other systems were reviewed with the patient and are negative.  MEDICAL HISTORY:  Past Medical History:  Diagnosis Date   Anemia    Anxiety    Arthritis    CAD (coronary artery disease)    Myoview 12/17: EF 66, apical and apical lateral defect - likely attenuation artifact, no ischemia; Low Risk // Myoview 3/19:  EF 74, no ischemia or scar; low risk    Cataract, diabetic (HCC)    Chest pain    Chronic kidney disease    stage 3   Constipation    Contusion of arm    Diabetes mellitus    Diabetic retinopathy    DM (diabetes mellitus) ([place])    Dyspnea    SOb on exertion   Esophageal stricture    Family history of ovarian cancer    Fatigue    Glaucoma    Heart burn    Hiatal hernia    History of echocardiogram    Echo 3/19:  Mild LVH, EF 60-65, no RWMA, Gr 1 DD, PASP 34   HLD (hyperlipidemia)    Hypertension    Joint stiffness of hand    Myocardial infarction ([place])    2004, stent   Urinary incontinence    Vitamin D deficiency     SURGICAL HISTORY: Past Surgical History:  Procedure Laterality Date   adenosine cardillite  05/16/05   negative signif. myocardial ischemic   BREAST LUMPECTOMY WITH RADIOACTIVE SEED LOCALIZATION Right 07/29/2019   Procedure:  RIGHT BREAST LUMPECTOMY WITH RADIOACTIVE SEED LOCALIZATION;  Surgeon: [name], MD;  Location: [facility];  Service: General;  Laterality: Right;   CARDIAC CATHETERIZATION  10/04   s/p stent LV 45%   CATARACT EXTRACTION     bilateral   CESAREAN SECTION  84, 86   ectopic pregnancy   CHOLECYSTECTOMY  1998   CORONARY ANGIOPLASTY WITH STENT PLACEMENT  10/04   PARTIAL HYSTERECTOMY     RE-EXCISION OF BREAST LUMPECTOMY Right 08/31/2019   Procedure: RE-EXCISION OF RIGHT BREAST LUMPECTOMY;  Surgeon: [name], MD;  Location: [facility];  Service: General;  Laterality: Right;    I have reviewed the social history and family history with the patient and they are unchanged from previous note.  ALLERGIES:  is allergic to penicillins, ibuprofen, lactose intolerance (gi), milk-related compounds, nyquil multi-symptom [pseudoeph-doxylamine-dm-apap], and latex.  MEDICATIONS:  Current Outpatient Medications  Medication Sig Dispense Refill   Accu-Chek FastClix Lancets MISC 1 each by Other route 3 (three) times daily.     ACCU-CHEK GUIDE test strip 1 each by Other route as needed.     acetaminophen (TYLENOL) 500 MG tablet Take  500-1,000 mg by mouth every 6 (six) hours as needed for moderate pain.      albuterol (PROVENTIL HFA;VENTOLIN HFA) 108 (90 Base) MCG/ACT inhaler Inhale 1-2 puffs into the lungs every 4 (four) hours as needed for wheezing or shortness of breath. 1 Inhaler 0   amLODipine (NORVASC) 10 MG tablet Take 1 tablet by mouth daily.     aspirin 81 MG EC tablet Take 81 mg by mouth every morning.      Cholecalciferol (D3 HIGH POTENCY) 50 MCG (2000 UT) CAPS Take 2,000 Units by mouth daily.      ferrous sulfate 325 (65 FE) MG tablet Take 325 mg by mouth daily with breakfast.     gabapentin (NEURONTIN) 300 MG capsule Take 300 mg by mouth 2 (two) times daily.     lisinopril-hydrochlorothiazide (PRINZIDE,ZESTORETIC) 20-12.5 MG tablet Take 1 tablet by mouth daily.     lovastatin  (MEVACOR) 10 MG tablet Take 10 mg by mouth at bedtime.     MYRBETRIQ 25 MG TB24 tablet Take 25 mg by mouth daily.      nitroGLYCERIN (NITROSTAT) 0.4 MG SL tablet Place 0.4 mg under the tongue every 5 (five) minutes as needed for chest pain (3 doses max).      TURMERIC PO Take 1 capsule by mouth daily as needed (arthritis pain).     Current Facility-Administered Medications  Medication Dose Route Frequency Provider Last Rate Last Admin   0.9 %  sodium chloride infusion  500 mL Intravenous Once Daryel November, MD        PHYSICAL EXAMINATION: ECOG PERFORMANCE STATUS: 0 - Asymptomatic  Vitals:   11/08/20 1305  BP: 124/78  Pulse: 85  Resp: 18  Temp: 98.4 F (36.9 C)  SpO2: 99%   Wt Readings from Last 3 Encounters:  11/08/20 149 lb 14.4 oz (68 kg)  10/18/20 149 lb (67.6 kg)  09/24/20 149 lb 9.6 oz (67.9 kg)     GENERAL:alert, no distress and comfortable SKIN: skin color, texture, turgor are normal, no rashes or significant lesions EYES: normal, Conjunctiva are pink and non-injected, sclera clear  NECK: supple, thyroid normal size, non-tender, without nodularity LYMPH:  no palpable lymphadenopathy in the cervical, axillary  LUNGS: clear to auscultation and percussion with normal breathing effort HEART: regular rate & rhythm and no murmurs and no lower extremity edema ABDOMEN:abdomen soft, non-tender and normal bowel sounds Musculoskeletal:no cyanosis of digits and no clubbing  NEURO: alert & oriented x 3 with fluent speech, no focal motor/sensory deficits BREAST:  No palpable mass, nodules or adenopathy bilaterally. Breast exam benign.   LABORATORY DATA:  I have reviewed the data as listed CBC Latest Ref Rng & Units 09/24/2020 08/07/2020 08/25/2019  WBC 4.0 - 10.5 K/uL 4.9 8.1 5.0  Hemoglobin 12.0 - 15.0 g/dL 12.2 13.3 11.7(L)  Hematocrit 36.0 - 46.0 % 36.4 41.7 36.9  Platelets 150.0 - 400.0 K/uL 160.0 148(L) 195     CMP Latest Ref Rng & Units 09/24/2020 08/07/2020  08/25/2019  Glucose 70 - 99 mg/dL 98 119(H) 110(H)  BUN 6 - 23 mg/dL 30(H) 25(H) 18  Creatinine 0.40 - 1.20 mg/dL 1.61(H) 1.62(H) 1.31(H)  Sodium 135 - 145 mEq/L 139 140 141  Potassium 3.5 - 5.1 mEq/L 4.0 4.1 3.7  Chloride 96 - 112 mEq/L 104 104 108  CO2 19 - 32 mEq/L _0 Calcium 8.4 - 10.5 mg/dL 10.0 9.7 9.6  Total Protein 6.5 - 8.1 g/dL - 7.3 -  Total Bilirubin 0.3 -  1.2 mg/dL - 0.8 -  Alkaline Phos 38 - 126 U/L - 56 -  AST 15 - 41 U/L - 27 -  ALT 0 - 44 U/L - 22 -      RADIOGRAPHIC STUDIES: I have personally reviewed the radiological images as listed and agreed with the findings in the report. No results found.   ASSESSMENT & PLAN:  Evelyn Morrison is a 80 y.o. female with   1. Ductal carcinoma in situ (DCIS) of right breast, Stage 0, ER-/PR- -She was diagnosed in 05/2019. She was found to have 0.6cm right breast mass in right breast found by mammogram and biopsy showed ductal carcinoma in situ.  -She underwent right lumpectomy on 07/29/19 with Dr Barry Dienes. Path showed positive margins of DCIS so she underwent re-excision surgery on 08/31/19. Pathology showed no residual carcinoma.  -Surgery was curative so any further treatment is preventative to reduce her risk of future breast cancer.  -She is undergoing adjuvant Radiation with Dr Sondra Come since 10/06/19. Plan to complete 11/02/19. She is tolerating well with manageable skin irritation.  -Given her negative ER and PR, I do not recommend antiestrogen therapy given there is no benefit. -I discussed DCIS and ER/PR negative disease is more aggressive and puts her at a higher risk of developing future breast cancer.  -We also discussed the breast cancer surveillance after her surgery. She will continue annual screening mammogram, self exams, and a routine office visit and exam with [place]. I discussed the option of additional screening with annual breast MRIs. She agrees  -MRI breast in 11/2020 and next mammogram in 05/2021, then continue  yearly.  -F/u with NP Lacie 1 year.    2. Comorbidities: DM, HTN, Stage III CKD, CAD -She had a MI in 2004 and had stent placed.  -Her HTN and DM is controlled and her CKD is stable.  -I encouraged her to continue medications and f/u with her physicians.    3. Arthritis, Leg cramps -She was previously on oral calcium but due to her CKD was switching to Vit D.    4. Genetic Testing was negative for pathogenetic mutations    5. Limited ROM of left shoulder -prior right shoulder limited ROM resolved -she now reports limited ROM of her left shoulder. She was told to take tylenol, but this did not help.     PLAN:  -breast MRI in 11/2020 -Mammogram in 05/2021 -F/u in 1 year with NP Lacie   No problem-specific Assessment & Plan notes found for this encounter.   Orders Placed This Encounter  Procedures   MR BREAST BILATERAL W WO CONTRAST INC CAD    Breast cancer screening    Standing Status:   Future    Standing Expiration Date:   11/08/2021    Order Specific Question:   If indicated for the ordered procedure, I authorize the administration of contrast media per Radiology protocol    Answer:   Yes    Order Specific Question:   What is the patient's sedation requirement?    Answer:   No Sedation    Order Specific Question:   Does the patient have a pacemaker or implanted devices?    Answer:   No    Order Specific Question:   Radiology Contrast Protocol - do NOT remove file path    Answer:   [email or web address]    Order Specific Question:   Preferred imaging location?    Answer:   [facility] (table limit-550lbs)   MM DIAG  BREAST TOMO BILATERAL    Standing Status:   Future    Standing Expiration Date:   11/08/2021    Scheduling Instructions:     [facility]    Order Specific Question:   Reason for Exam (SYMPTOM  OR DIAGNOSIS REQUIRED)    Answer:   screening    Order Specific Question:   Preferred imaging location?    Answer:   External    All questions were answered. The patient knows to call the clinic with any problems, questions or concerns. No barriers to learning was detected. The total time spent in the appointment was 20 minutes.     Truitt Merle, MD 11/08/2020   I, Wilburn Mylar, am acting as scribe for Truitt Merle, MD.   I have reviewed the above documentation for accuracy and completeness, and I agree with the above.

## 2020-12-04 DIAGNOSIS — E119 Type 2 diabetes mellitus without complications: Secondary | ICD-10-CM | POA: Diagnosis not present

## 2020-12-04 DIAGNOSIS — E1142 Type 2 diabetes mellitus with diabetic polyneuropathy: Secondary | ICD-10-CM | POA: Diagnosis not present

## 2020-12-04 DIAGNOSIS — E782 Mixed hyperlipidemia: Secondary | ICD-10-CM | POA: Diagnosis not present

## 2020-12-04 DIAGNOSIS — N1831 Chronic kidney disease, stage 3a: Secondary | ICD-10-CM | POA: Diagnosis not present

## 2020-12-04 DIAGNOSIS — I10 Essential (primary) hypertension: Secondary | ICD-10-CM | POA: Diagnosis not present

## 2020-12-04 DIAGNOSIS — I25118 Atherosclerotic heart disease of native coronary artery with other forms of angina pectoris: Secondary | ICD-10-CM | POA: Diagnosis not present

## 2020-12-04 DIAGNOSIS — M2550 Pain in unspecified joint: Secondary | ICD-10-CM | POA: Diagnosis not present

## 2021-01-15 DIAGNOSIS — E782 Mixed hyperlipidemia: Secondary | ICD-10-CM | POA: Diagnosis not present

## 2021-01-15 DIAGNOSIS — I10 Essential (primary) hypertension: Secondary | ICD-10-CM | POA: Diagnosis not present

## 2021-01-15 DIAGNOSIS — E119 Type 2 diabetes mellitus without complications: Secondary | ICD-10-CM | POA: Diagnosis not present

## 2021-01-15 DIAGNOSIS — N1831 Chronic kidney disease, stage 3a: Secondary | ICD-10-CM | POA: Diagnosis not present

## 2021-04-04 DIAGNOSIS — E119 Type 2 diabetes mellitus without complications: Secondary | ICD-10-CM | POA: Diagnosis not present

## 2021-04-04 DIAGNOSIS — E782 Mixed hyperlipidemia: Secondary | ICD-10-CM | POA: Diagnosis not present

## 2021-04-04 DIAGNOSIS — M1990 Unspecified osteoarthritis, unspecified site: Secondary | ICD-10-CM | POA: Diagnosis not present

## 2021-04-04 DIAGNOSIS — N1831 Chronic kidney disease, stage 3a: Secondary | ICD-10-CM | POA: Diagnosis not present

## 2021-04-04 DIAGNOSIS — Z0000 Encounter for general adult medical examination without abnormal findings: Secondary | ICD-10-CM | POA: Diagnosis not present

## 2021-04-04 DIAGNOSIS — I10 Essential (primary) hypertension: Secondary | ICD-10-CM | POA: Diagnosis not present

## 2021-04-22 DIAGNOSIS — C50411 Malignant neoplasm of upper-outer quadrant of right female breast: Secondary | ICD-10-CM | POA: Diagnosis not present

## 2021-04-22 DIAGNOSIS — Z171 Estrogen receptor negative status [ER-]: Secondary | ICD-10-CM | POA: Diagnosis not present

## 2021-05-03 ENCOUNTER — Encounter: Payer: Self-pay | Admitting: Cardiovascular Disease

## 2021-05-03 ENCOUNTER — Ambulatory Visit (INDEPENDENT_AMBULATORY_CARE_PROVIDER_SITE_OTHER): Payer: Medicare HMO | Admitting: Cardiovascular Disease

## 2021-05-03 ENCOUNTER — Other Ambulatory Visit: Payer: Self-pay

## 2021-05-03 VITALS — BP 120/80 | HR 75 | Ht 60.0 in | Wt 154.2 lb

## 2021-05-03 DIAGNOSIS — I10 Essential (primary) hypertension: Secondary | ICD-10-CM | POA: Diagnosis not present

## 2021-05-03 DIAGNOSIS — E119 Type 2 diabetes mellitus without complications: Secondary | ICD-10-CM | POA: Diagnosis not present

## 2021-05-03 DIAGNOSIS — E782 Mixed hyperlipidemia: Secondary | ICD-10-CM

## 2021-05-03 DIAGNOSIS — I25118 Atherosclerotic heart disease of native coronary artery with other forms of angina pectoris: Secondary | ICD-10-CM

## 2021-05-03 NOTE — Progress Notes (Signed)
Cardiology Office Note:    Date:  05/03/2021   ID:  Evelyn Morrison, DOB 07-08-1940, MRN [id number]  PCP:  Benito Mccreedy, MD   [facility] Providers Cardiologist:  Sherren Mocha, MD     Referring MD: Benito Mccreedy, MD   Chief Complaint  Patient presents with   Coronary Artery Disease    History of Present Illness:    Evelyn Morrison is a 81 y.o. female with a hx of coronary artery disease, status post remote MI.  She underwent PCI at the time of her infarct in 2004, treated at another health system.  She has had multiple stress tests over the years, most recently in 2019 when a Lexiscan Myoview showed no ischemia or infarction with normal LVEF.  The patient is here alone today.  She is doing well with no chest pain, chest pressure, or shortness of breath.  She is having some knee problems and is limited by this.  She has an orthopedic evaluation coming up later this spring.  No edema, orthopnea, PND, or heart palpitations.  Reports compliance with her medications.  Past Medical History:  Diagnosis Date   Anemia    Anxiety    Arthritis    CAD (coronary artery disease)    Myoview 12/17: EF 66, apical and apical lateral defect - likely attenuation artifact, no ischemia; Low Risk // Myoview 3/19:  EF 74, no ischemia or scar; low risk    Cataract, diabetic (HCC)    Chest pain    Chronic kidney disease    stage 3   Constipation    Contusion of arm    Diabetes mellitus    Diabetic retinopathy    DM (diabetes mellitus) ([place])    Dyspnea    SOb on exertion   Esophageal stricture    Family history of ovarian cancer    Fatigue    Glaucoma    Heart burn    Hiatal hernia    History of echocardiogram    Echo 3/19:  Mild LVH, EF 60-65, no RWMA, Gr 1 DD, PASP 34   HLD (hyperlipidemia)    Hypertension    Joint stiffness of hand    Myocardial infarction ([place])    2004, stent   Urinary incontinence    Vitamin D deficiency     Past Surgical History:  Procedure  Laterality Date   adenosine cardillite  05/16/05   negative signif. myocardial ischemic   BREAST LUMPECTOMY WITH RADIOACTIVE SEED LOCALIZATION Right 07/29/2019   Procedure: RIGHT BREAST LUMPECTOMY WITH RADIOACTIVE SEED LOCALIZATION;  Surgeon: [name], MD;  Location: [facility];  Service: General;  Laterality: Right;   CARDIAC CATHETERIZATION  10/04   s/p stent LV 45%   CATARACT EXTRACTION     bilateral   CESAREAN SECTION  84, 86   ectopic pregnancy   [place]  10/04   PARTIAL HYSTERECTOMY     RE-EXCISION OF BREAST LUMPECTOMY Right 08/31/2019   Procedure: RE-EXCISION OF RIGHT BREAST LUMPECTOMY;  Surgeon: [name], MD;  Location: [facility];  Service: General;  Laterality: Right;    Current Medications: Current Meds  Medication Sig   Accu-Chek FastClix Lancets MISC 1 each by Other route 3 (three) times daily.   ACCU-CHEK GUIDE test strip 1 each by Other route as needed.   acetaminophen (TYLENOL) 500 MG tablet Take 500-1,000 mg by mouth every 6 (six) hours as needed for moderate pain.    albuterol (PROVENTIL HFA;VENTOLIN  HFA) 108 (90 Base) MCG/ACT inhaler Inhale 1-2 puffs into the lungs every 4 (four) hours as needed for wheezing or shortness of breath.   aspirin 81 MG EC tablet Take 81 mg by mouth every morning.    atorvastatin (LIPITOR) 40 MG tablet Take 40 mg by mouth daily.   Biotin 10000 MCG TABS Take by mouth.   Cholecalciferol (D3 HIGH POTENCY) 50 MCG (2000 UT) CAPS Take 2,000 Units by mouth daily.    ferrous sulfate 325 (65 FE) MG tablet Take 325 mg by mouth daily with breakfast.   gabapentin (NEURONTIN) 300 MG capsule Take 300 mg by mouth 2 (two) times daily.   lisinopril-hydrochlorothiazide (PRINZIDE,ZESTORETIC) 20-12.5 MG tablet Take 1 tablet by mouth daily.   Multiple Vitamin (MULTIVITAMIN) capsule Take 2 capsules by mouth daily.   MYRBETRIQ 25 MG TB24 tablet Take 25 mg by mouth daily.     nitroGLYCERIN (NITROSTAT) 0.4 MG SL tablet Place 0.4 mg under the tongue every 5 (five) minutes as needed for chest pain (3 doses max).    TURMERIC PO Take 1 capsule by mouth daily as needed (arthritis pain).   Current Facility-Administered Medications for the 05/03/21 encounter (Office Visit) with Sherren Mocha, MD  Medication   0.9 %  sodium chloride infusion     Allergies:   Penicillins, Ibuprofen, Lactose intolerance (gi), Milk-related compounds, Nyquil multi-symptom [pseudoeph-doxylamine-dm-apap], and Latex   Social History   Socioeconomic History   Marital status: Married    Spouse name: Not on file   Number of children: 5    Years of education: Not on file   Highest education level: Not on file  Occupational History   Occupation: Education officer, museum    Comment: Retired  Tobacco Use   Smoking status: Former    Packs/day: 0.25    Years: 15.00    Pack years: 3.75    Types: Cigarettes    Quit date: 03/31/1990    Years since quitting: 31.1   Smokeless tobacco: Never  Substance and Sexual Activity   Alcohol use: No    Comment: ocassional   Drug use: No   Sexual activity: Not on file  Other Topics Concern   Not on file  Social History Narrative   2 caffeine drinks daily    Social Determinants of Health   Financial Resource Strain: Not on file  Food Insecurity: Not on file  Transportation Needs: Not on file  Physical Activity: Not on file  Stress: Not on file  Social Connections: Not on file     Family History: The patient's family history includes Crohn's disease in her brother, brother, and brother; Diabetes in an other family member; Heart attack in her father; Heart disease in her mother; Ovarian cancer (age of onset: 44) in her cousin. There is no history of Colon cancer.  ROS:   Please see the history of present illness.    All other systems reviewed and are negative.  EKGs/Labs/Other Studies Reviewed:    EKG:  EKG is ordered today.  The ekg ordered today  demonstrates NSR 75 bpm, within normal limits  Recent Labs: 08/07/2020: ALT 22 09/24/2020: BUN 30; Creatinine, Ser 1.61; Hemoglobin 12.2; Platelets 160.0; Potassium 4.0; Sodium 139  Recent Lipid Panel    Component Value Date/Time   CHOL 193 04/24/2017 1139   TRIG 56 04/24/2017 1139   HDL 86 04/24/2017 1139   CHOLHDL 2.2 04/24/2017 1139   CHOLHDL 2.3 03/10/2016 1008   VLDL 25 03/10/2016 1008   LDLCALC 96 04/24/2017  1139     Risk Assessment/Calculations:           Physical Exam:    VS:  BP 120/80    Pulse 75    Ht 5' (1.524 m)    Wt 154 lb 3.2 oz (69.9 kg)    SpO2 98%    BMI 30.12 kg/m     Wt Readings from Last 3 Encounters:  05/03/21 154 lb 3.2 oz (69.9 kg)  11/08/20 149 lb 14.4 oz (68 kg)  10/18/20 149 lb (67.6 kg)     GEN:  Well nourished, well developed pleasant elderly woman in no acute distress HEENT: Normal NECK: No JVD; No carotid bruits LYMPHATICS: No lymphadenopathy CARDIAC: RRR, no murmurs, rubs, gallops RESPIRATORY:  Clear to auscultation without rales, wheezing or rhonchi  ABDOMEN: Soft, non-tender, non-distended MUSCULOSKELETAL:  No edema; No deformity  SKIN: Warm and dry NEUROLOGIC:  Alert and oriented x 3 PSYCHIATRIC:  Normal affect   ASSESSMENT:    1. Coronary artery disease of native artery of native heart with stable angina pectoris ([place])   2. Type 2 diabetes mellitus without complication, without long-term current use of insulin ([place])   3. Essential (primary) hypertension   4. Mixed hyperlipidemia    PLAN:    In order of problems listed above:  The patient is stable without symptoms of angina.  She takes aspirin for antiplatelet therapy and high intensity statin drug with atorvastatin 40 mg daily. Reports she was taken off of her oral hypoglycemic medications because of symptomatic hypoglycemia.  Overall doing well.  Notes blood sugars are running in a good range at home. Blood pressure well controlled on lisinopril/HCTZ.  We will update labs  today.  Check metabolic panel.  Patient noted to have stage III chronic kidney disease. Last lipid panel from 2021 showed LDL cholesterol above goal, 107 at that time.  Update lipids and LFTs today.  Adjust lipid-lowering therapy as indicated.           Medication Adjustments/Labs and Tests Ordered: Current medicines are reviewed at length with the patient today.  Concerns regarding medicines are outlined above.  Orders Placed This Encounter  Procedures   Hepatic function panel   Lipid panel   EKG 12-Lead   No orders of the defined types were placed in this encounter.   Patient Instructions  Medication Instructions:  Your physician recommends that you continue on your current medications as directed. Please refer to the Current Medication list given to you today.  *If you need a refill on your cardiac medications before your next appointment, please call your pharmacy*   Lab Work: Lipids and Liver (today) If you have labs (blood work) drawn today and your tests are completely normal, you will receive your results only by: [place] (if you have MyChart) OR A paper copy in the mail If you have any lab test that is abnormal or we need to change your treatment, we will call you to review the results.   Testing/Procedures: NONE   Follow-Up: At [facility], you and your health needs are our priority.  As part of our continuing mission to provide you with exceptional heart care, we have created designated Provider Care Teams.  These Care Teams include your primary Cardiologist (physician) and Advanced Practice Providers (APPs -  Physician Assistants and Nurse Practitioners) who all work together to provide you with the care you need, when you need it.  We recommend signing up for the patient portal called "MyChart".  Sign up information is provided on this After Visit Summary.  MyChart is used to connect with patients for Virtual Visits (Telemedicine).  Patients are able  to view lab/test results, encounter notes, upcoming appointments, etc.  Non-urgent messages can be sent to your provider as well.   To learn more about what you can do with MyChart, go to [email or web address].    Your next appointment:   1 year(s)  The format for your next appointment:   In Person  Provider:   Sherren Mocha, MD     Thank you for allowing myself and [facility] to serve you, God Bless!!    Signed, Sherren Mocha, MD  05/03/2021 12:44 PM    [place]

## 2021-05-03 NOTE — Patient Instructions (Signed)
Medication Instructions:  Your physician recommends that you continue on your current medications as directed. Please refer to the Current Medication list given to you today.  *If you need a refill on your cardiac medications before your next appointment, please call your pharmacy*   Lab Work: Lipids and Liver (today) If you have labs (blood work) drawn today and your tests are completely normal, you will receive your results only by: [place] (if you have MyChart) OR A paper copy in the mail If you have any lab test that is abnormal or we need to change your treatment, we will call you to review the results.   Testing/Procedures: NONE   Follow-Up: At [facility], you and your health needs are our priority.  As part of our continuing mission to provide you with exceptional heart care, we have created designated Provider Care Teams.  These Care Teams include your primary Cardiologist (physician) and Advanced Practice Providers (APPs -  Physician Assistants and Nurse Practitioners) who all work together to provide you with the care you need, when you need it.  We recommend signing up for the patient portal called "MyChart".  Sign up information is provided on this After Visit Summary.  MyChart is used to connect with patients for Virtual Visits (Telemedicine).  Patients are able to view lab/test results, encounter notes, upcoming appointments, etc.  Non-urgent messages can be sent to your provider as well.   To learn more about what you can do with MyChart, go to [email or web address].    Your next appointment:   1 year(s)  The format for your next appointment:   In Person  Provider:   Sherren Mocha, MD     Thank you for allowing myself and [place] to serve you, God Bless!!

## 2021-05-04 LAB — LIPID PANEL
Chol/HDL Ratio: 2.2 ratio (ref 0.0–4.4)
Cholesterol, Total: 148 mg/dL (ref 100–199)
HDL: 67 mg/dL (ref >39)
LDL Chol Calc (NIH): 61 mg/dL (ref 0–99)
Triglycerides: 113 mg/dL (ref 0–149)
VLDL Cholesterol Cal: 20 mg/dL (ref 5–40)

## 2021-05-04 LAB — HEPATIC FUNCTION PANEL
ALT: 22 IU/L (ref 0–32)
AST: 23 IU/L (ref 0–40)
Albumin: 4.2 g/dL (ref 3.7–4.7)
Alkaline Phosphatase: 79 IU/L (ref 44–121)
Bilirubin Total: 0.2 mg/dL (ref 0.0–1.2)
Bilirubin, Direct: 0.1 mg/dL (ref 0.00–0.40)
Total Protein: 6.9 g/dL (ref 6.0–8.5)

## 2021-05-09 DIAGNOSIS — C50911 Malignant neoplasm of unspecified site of right female breast: Secondary | ICD-10-CM | POA: Diagnosis not present

## 2021-05-21 DIAGNOSIS — Z853 Personal history of malignant neoplasm of breast: Secondary | ICD-10-CM | POA: Diagnosis not present

## 2021-06-03 DIAGNOSIS — N183 Chronic kidney disease, stage 3 unspecified: Secondary | ICD-10-CM | POA: Diagnosis not present

## 2021-06-03 DIAGNOSIS — N189 Chronic kidney disease, unspecified: Secondary | ICD-10-CM | POA: Diagnosis not present

## 2021-06-12 DIAGNOSIS — D631 Anemia in chronic kidney disease: Secondary | ICD-10-CM | POA: Diagnosis not present

## 2021-06-12 DIAGNOSIS — N1832 Chronic kidney disease, stage 3b: Secondary | ICD-10-CM | POA: Diagnosis not present

## 2021-06-12 DIAGNOSIS — E559 Vitamin D deficiency, unspecified: Secondary | ICD-10-CM | POA: Diagnosis not present

## 2021-06-12 DIAGNOSIS — I129 Hypertensive chronic kidney disease with stage 1 through stage 4 chronic kidney disease, or unspecified chronic kidney disease: Secondary | ICD-10-CM | POA: Diagnosis not present

## 2021-06-12 DIAGNOSIS — M1990 Unspecified osteoarthritis, unspecified site: Secondary | ICD-10-CM | POA: Diagnosis not present

## 2021-07-26 DIAGNOSIS — E119 Type 2 diabetes mellitus without complications: Secondary | ICD-10-CM | POA: Diagnosis not present

## 2021-07-26 DIAGNOSIS — H52203 Unspecified astigmatism, bilateral: Secondary | ICD-10-CM | POA: Diagnosis not present

## 2021-08-12 DIAGNOSIS — M25512 Pain in left shoulder: Secondary | ICD-10-CM | POA: Diagnosis not present

## 2021-08-12 DIAGNOSIS — M25562 Pain in left knee: Secondary | ICD-10-CM | POA: Diagnosis not present

## 2021-08-12 DIAGNOSIS — M79642 Pain in left hand: Secondary | ICD-10-CM | POA: Diagnosis not present

## 2021-08-12 DIAGNOSIS — Z6828 Body mass index (BMI) 28.0-28.9, adult: Secondary | ICD-10-CM | POA: Diagnosis not present

## 2021-08-12 DIAGNOSIS — R52 Pain, unspecified: Secondary | ICD-10-CM | POA: Diagnosis not present

## 2021-08-12 DIAGNOSIS — M79641 Pain in right hand: Secondary | ICD-10-CM | POA: Diagnosis not present

## 2021-08-12 DIAGNOSIS — E663 Overweight: Secondary | ICD-10-CM | POA: Diagnosis not present

## 2021-08-12 DIAGNOSIS — E1122 Type 2 diabetes mellitus with diabetic chronic kidney disease: Secondary | ICD-10-CM | POA: Diagnosis not present

## 2021-08-29 DIAGNOSIS — M1991 Primary osteoarthritis, unspecified site: Secondary | ICD-10-CM | POA: Diagnosis not present

## 2021-08-29 DIAGNOSIS — M25512 Pain in left shoulder: Secondary | ICD-10-CM | POA: Diagnosis not present

## 2021-08-29 DIAGNOSIS — Z6827 Body mass index (BMI) 27.0-27.9, adult: Secondary | ICD-10-CM | POA: Diagnosis not present

## 2021-08-29 DIAGNOSIS — R52 Pain, unspecified: Secondary | ICD-10-CM | POA: Diagnosis not present

## 2021-08-29 DIAGNOSIS — M79642 Pain in left hand: Secondary | ICD-10-CM | POA: Diagnosis not present

## 2021-08-29 DIAGNOSIS — M79641 Pain in right hand: Secondary | ICD-10-CM | POA: Diagnosis not present

## 2021-08-29 DIAGNOSIS — E663 Overweight: Secondary | ICD-10-CM | POA: Diagnosis not present

## 2021-08-29 DIAGNOSIS — M25562 Pain in left knee: Secondary | ICD-10-CM | POA: Diagnosis not present

## 2021-09-10 DIAGNOSIS — E782 Mixed hyperlipidemia: Secondary | ICD-10-CM | POA: Diagnosis not present

## 2021-09-10 DIAGNOSIS — Z0001 Encounter for general adult medical examination with abnormal findings: Secondary | ICD-10-CM | POA: Diagnosis not present

## 2021-09-10 DIAGNOSIS — E119 Type 2 diabetes mellitus without complications: Secondary | ICD-10-CM | POA: Diagnosis not present

## 2021-09-10 DIAGNOSIS — I10 Essential (primary) hypertension: Secondary | ICD-10-CM | POA: Diagnosis not present

## 2021-09-10 DIAGNOSIS — N1831 Chronic kidney disease, stage 3a: Secondary | ICD-10-CM | POA: Diagnosis not present

## 2021-09-11 DIAGNOSIS — N1831 Chronic kidney disease, stage 3a: Secondary | ICD-10-CM | POA: Diagnosis not present

## 2021-09-11 DIAGNOSIS — Z0001 Encounter for general adult medical examination with abnormal findings: Secondary | ICD-10-CM | POA: Diagnosis not present

## 2021-09-11 DIAGNOSIS — I10 Essential (primary) hypertension: Secondary | ICD-10-CM | POA: Diagnosis not present

## 2021-09-11 DIAGNOSIS — E782 Mixed hyperlipidemia: Secondary | ICD-10-CM | POA: Diagnosis not present

## 2021-09-11 DIAGNOSIS — E119 Type 2 diabetes mellitus without complications: Secondary | ICD-10-CM | POA: Diagnosis not present

## 2021-09-11 DIAGNOSIS — M1990 Unspecified osteoarthritis, unspecified site: Secondary | ICD-10-CM | POA: Diagnosis not present

## 2021-10-23 DIAGNOSIS — M1712 Unilateral primary osteoarthritis, left knee: Secondary | ICD-10-CM | POA: Diagnosis not present

## 2021-11-04 ENCOUNTER — Other Ambulatory Visit: Payer: Self-pay | Admitting: Nurse Practitioner

## 2021-11-04 DIAGNOSIS — D0511 Intraductal carcinoma in situ of right breast: Secondary | ICD-10-CM

## 2021-11-04 NOTE — Progress Notes (Deleted)
[name]   [phone] [phone]   Clinic Follow up Note   Patient Care Team: Benito Mccreedy, MD as PCP - General (Internal Medicine) Sherren Mocha, MD as PCP - Cardiology (Cardiology) Mauro Kaufmann, RN as Oncology Nurse Navigator Rockwell Germany, RN as Oncology Nurse Navigator [name], MD as Consulting Physician (General Surgery) Truitt Merle, MD as Consulting Physician (Hematology) Gery Pray, MD as Consulting Physician (Radiation Oncology) Evelyn Feeling, NP as Nurse Practitioner (Nurse Practitioner) 11/04/2021  CHIEF COMPLAINT: Follow-up right breast DCIS  SUMMARY OF ONCOLOGIC HISTORY: Oncology History Overview Note  Cancer Staging Ductal carcinoma in situ (DCIS) of right breast Staging form: Breast, AJCC 8th Edition - Clinical stage from 06/08/2019: Stage 0 (cTis (DCIS), cN0, cM0, ER-, PR-, HER2: Not Assessed) - Signed by Truitt Merle, MD on 06/21/2019    Ductal carcinoma in situ (DCIS) of right breast  05/30/2019 Mammogram   Diagnostic Mammogram  IMPRESSION The developing round calcifications in the upper outer aspect posterior depth right breast  measuring 0.6cm are suspicious for malignancy. A biopsy is recommended.    06/08/2019 Cancer Staging   Staging form: Breast, AJCC 8th Edition - Clinical stage from 06/08/2019: Stage 0 (cTis (DCIS), cN0, cM0, ER-, PR-, HER2: Not Assessed) - Signed by Truitt Merle, MD on 06/21/2019   06/08/2019 Initial Biopsy   Diagnosis Breast, right, needle core biopsy, calcifications - DUCTAL CARCINOMA IN SITU. Microscopic Comment The DCIS is of intermediate to high nuclear grade with central necrosis and calcifications. Immunohistochemistry for p63, Calponin and SMM-1 demonstrates the presence of myoepithelium; a distinct invasive component is not identified in the submitted material. Ancillary studies will be reported separately. Results reported to [facility] on 06/09/2019.    06/08/2019 Receptors her2   PROGNOSTIC INDICATORS Results: IMMUNOHISTOCHEMICAL AND MORPHOMETRIC ANALYSIS PERFORMED MANUALLY Estrogen Receptor: 0%, NEGATIVE Progesterone Receptor: 0%, NEGATIVE   06/14/2019 Initial Diagnosis   Ductal carcinoma in situ (DCIS) of right breast    Genetic Testing   Negative genetic testing. No pathogenic variants identified on the [facility] Common Hereditary Caners Panel. VUS in [place] called c.-30476T>A (Non-coding) and VUS in [place] called c.580G>A (p.Ala194Thr) identified. The report date is 07/04/2019.  The Common Hereditary Cancers Panel offered by [facility] includes sequencing and/or deletion duplication testing of the following 48 genes: APC, ATM, AXIN2, BARD1, BMPR1A, BRCA1, BRCA2, BRIP1, CDH1, CDKN2A (p14ARF), CDKN2A (p16INK4a), CKD4, CHEK2, CTNNA1, DICER1, EPCAM (Deletion/duplication testing only), GREM1 (promoter region deletion/duplication testing only), KIT, MEN1, MLH1, MSH2, MSH3, MSH6, MUTYH, NBN, NF1, NHTL1, PALB2, PDGFRA, PMS2, POLD1, POLE, PTEN, RAD50, RAD51C, RAD51D, RNF43, SDHB, SDHC, SDHD, SMAD4, SMARCA4. STK11, TP53, TSC1, TSC2, and VHL.  The following genes were evaluated for sequence changes only: SDHA and HOXB13 c.251G>A variant only.   07/29/2019 Surgery   RIGHT BREAST LUMPECTOMY WITH RADIOACTIVE SEED LOCALIZATION by Dr Barry Dienes    07/29/2019 Pathology Results   FINAL MICROSCOPIC DIAGNOSIS:   A. BREAST, RIGHT SUPERFICIAL, LUMPECTOMY:  -  No residual carcinoma identified   B. BREAST, RIGHT, LUMPECTOMY:  -  Ductal carcinoma in situ, high grade, 1.2 cm  -  Margins involved by carcinoma (lateral, broadly)  -  Previous biopsy site changes  -  See oncology table and comment below below    08/31/2019 Surgery   RE-EXCISION OF RIGHT BREAST LUMPECTOMY by Dr Barry Dienes    08/31/2019 Pathology Results   FINAL MICROSCOPIC DIAGNOSIS:   A. BREAST, RIGHT LATERAL ASPECT, RE-EXCISION:  -  Previous surgical site changes with granulation tissue  -  Vessels with  organizing thrombi  -  No residual carcinoma identified    10/06/2019 - 11/02/2019 Radiation Therapy   Adjuvant Radiation with Dr Sondra Come    02/27/2020 Survivorship   SCP reviewed by Cira Rue, NP via virtual visit and mailed to patient     CURRENT THERAPY: Surveillance  INTERVAL HISTORY: Evelyn Morrison returns for follow-up as scheduled, last seen by Dr. Burr Medico 11/08/2020.   REVIEW OF SYSTEMS:   Constitutional: Denies fevers, chills or abnormal weight loss Eyes: Denies blurriness of vision Ears, nose, mouth, throat, and face: Denies mucositis or sore throat Respiratory: Denies cough, dyspnea or wheezes Cardiovascular: Denies palpitation, chest discomfort or lower extremity swelling Gastrointestinal:  Denies nausea, heartburn or change in bowel habits Skin: Denies abnormal skin rashes Lymphatics: Denies new lymphadenopathy or easy bruising Neurological:Denies numbness, tingling or new weaknesses Behavioral/Psych: Mood is stable, no new changes  All other systems were reviewed with the patient and are negative.  MEDICAL HISTORY:  Past Medical History:  Diagnosis Date   Anemia    Anxiety    Arthritis    CAD (coronary artery disease)    Myoview 12/17: EF 66, apical and apical lateral defect - likely attenuation artifact, no ischemia; Low Risk // Myoview 3/19:  EF 74, no ischemia or scar; low risk    Cataract, diabetic (HCC)    Chest pain    Chronic kidney disease    stage 3   Constipation    Contusion of arm    Diabetes mellitus    Diabetic retinopathy    DM (diabetes mellitus) ([place])    Dyspnea    SOb on exertion   Esophageal stricture    Family history of ovarian cancer    Fatigue    Glaucoma    Heart burn    Hiatal hernia    History of echocardiogram    Echo 3/19:  Mild LVH, EF 60-65, no RWMA, Gr 1 DD, PASP 34   HLD (hyperlipidemia)    Hypertension    Joint stiffness of hand    Myocardial infarction ([place])    2004, stent   Urinary incontinence    Vitamin D  deficiency     SURGICAL HISTORY: Past Surgical History:  Procedure Laterality Date   adenosine cardillite  05/16/05   negative signif. myocardial ischemic   BREAST LUMPECTOMY WITH RADIOACTIVE SEED LOCALIZATION Right 07/29/2019   Procedure: RIGHT BREAST LUMPECTOMY WITH RADIOACTIVE SEED LOCALIZATION;  Surgeon: [name], MD;  Location: [place];  Service: General;  Laterality: Right;   CARDIAC CATHETERIZATION  10/04   s/p stent LV 45%   CATARACT EXTRACTION     bilateral   CESAREAN SECTION  84, 86   ectopic pregnancy   CHOLECYSTECTOMY  1998   CORONARY ANGIOPLASTY WITH STENT PLACEMENT  10/04   PARTIAL HYSTERECTOMY     RE-EXCISION OF BREAST LUMPECTOMY Right 08/31/2019   Procedure: RE-EXCISION OF RIGHT BREAST LUMPECTOMY;  Surgeon: [name], MD;  Location: [facility];  Service: General;  Laterality: Right;    I have reviewed the social history and family history with the patient and they are unchanged from previous note.  ALLERGIES:  is allergic to penicillins, ibuprofen, lactose intolerance (gi), milk-related compounds, nyquil multi-symptom [pseudoeph-doxylamine-dm-apap], and latex.  MEDICATIONS:  Current Outpatient Medications  Medication Sig Dispense Refill   Accu-Chek FastClix Lancets MISC 1 each by Other route 3 (three) times daily.     ACCU-CHEK GUIDE test strip 1 each by Other route as needed.  acetaminophen (TYLENOL) 500 MG tablet Take 500-1,000 mg by mouth every 6 (six) hours as needed for moderate pain.      albuterol (PROVENTIL HFA;VENTOLIN HFA) 108 (90 Base) MCG/ACT inhaler Inhale 1-2 puffs into the lungs every 4 (four) hours as needed for wheezing or shortness of breath. 1 Inhaler 0   amLODipine (NORVASC) 10 MG tablet Take 1 tablet by mouth daily. (Patient not taking: Reported on 05/03/2021)     aspirin 81 MG EC tablet Take 81 mg by mouth every morning.      atorvastatin (LIPITOR) 40 MG tablet Take 40 mg by mouth daily.     Biotin 10000 MCG TABS Take by  mouth.     Cholecalciferol (D3 HIGH POTENCY) 50 MCG (2000 UT) CAPS Take 2,000 Units by mouth daily.      ferrous sulfate 325 (65 FE) MG tablet Take 325 mg by mouth daily with breakfast.     gabapentin (NEURONTIN) 300 MG capsule Take 300 mg by mouth 2 (two) times daily.     lisinopril-hydrochlorothiazide (PRINZIDE,ZESTORETIC) 20-12.5 MG tablet Take 1 tablet by mouth daily.     lovastatin (MEVACOR) 10 MG tablet Take 10 mg by mouth at bedtime. (Patient not taking: Reported on 05/03/2021)     Multiple Vitamin (MULTIVITAMIN) capsule Take 2 capsules by mouth daily.     MYRBETRIQ 25 MG TB24 tablet Take 25 mg by mouth daily.      nitroGLYCERIN (NITROSTAT) 0.4 MG SL tablet Place 0.4 mg under the tongue every 5 (five) minutes as needed for chest pain (3 doses max).      TURMERIC PO Take 1 capsule by mouth daily as needed (arthritis pain).     Current Facility-Administered Medications  Medication Dose Route Frequency Provider Last Rate Last Admin   0.9 %  sodium chloride infusion  500 mL Intravenous Once Daryel November, MD        PHYSICAL EXAMINATION: ECOG PERFORMANCE STATUS: {CHL ONC ECOG XI:3382505397}  There were no vitals filed for this visit. There were no vitals filed for this visit.  GENERAL:alert, no distress and comfortable SKIN: skin color, texture, turgor are normal, no rashes or significant lesions EYES: normal, Conjunctiva are pink and non-injected, sclera clear OROPHARYNX:no exudate, no erythema and lips, buccal mucosa, and tongue normal  NECK: supple, thyroid normal size, non-tender, without nodularity LYMPH:  no palpable lymphadenopathy in the cervical, axillary or inguinal LUNGS: clear to auscultation and percussion with normal breathing effort HEART: regular rate & rhythm and no murmurs and no lower extremity edema ABDOMEN:abdomen soft, non-tender and normal bowel sounds Musculoskeletal:no cyanosis of digits and no clubbing  NEURO: alert & oriented x 3 with fluent speech, no  focal motor/sensory deficits  LABORATORY DATA:  I have reviewed the data as listed    Latest Ref Rng & Units 09/24/2020   12:14 PM 08/07/2020    4:30 PM 08/25/2019    1:54 PM  CBC  WBC 4.0 - 10.5 K/uL 4.9  8.1  5.0   Hemoglobin 12.0 - 15.0 g/dL 12.2  13.3  11.7   Hematocrit 36.0 - 46.0 % 36.4  41.7  36.9   Platelets 150.0 - 400.0 K/uL 160.0  148  195         Latest Ref Rng & Units 05/03/2021   11:36 AM 09/24/2020   12:14 PM 08/07/2020    4:30 PM  CMP  Glucose 70 - 99 mg/dL  98  119   BUN 6 - 23 mg/dL  30  25  Creatinine 0.40 - 1.20 mg/dL  1.61  1.62   Sodium 135 - 145 mEq/L  139  140   Potassium 3.5 - 5.1 mEq/L  4.0  4.1   Chloride 96 - 112 mEq/L  104  104   CO2 19 - 32 mEq/L  25  24   Calcium 8.4 - 10.5 mg/dL  10.0  9.7   Total Protein 6.0 - 8.5 g/dL 6.9   7.3   Total Bilirubin 0.0 - 1.2 mg/dL 0.2   0.8   Alkaline Phos 44 - 121 IU/L 79   56   AST 0 - 40 IU/L 23   27   ALT 0 - 32 IU/L 22   22       RADIOGRAPHIC STUDIES: I have personally reviewed the radiological images as listed and agreed with the findings in the report. No results found.   ASSESSMENT & PLAN:  No problem-specific Assessment & Plan notes found for this encounter.   No orders of the defined types were placed in this encounter.  All questions were answered. The patient knows to call the clinic with any problems, questions or concerns. No barriers to learning was detected. I spent {CHL ONC TIME VISIT - DAPTC:0525910289} counseling the patient face to face. The total time spent in the appointment was {CHL ONC TIME VISIT - KSMMO:[id number]} and more than 50% was on counseling and review of test results     Evelyn Feeling, NP 11/04/21

## 2021-11-07 ENCOUNTER — Inpatient Hospital Stay: Payer: Medicare HMO | Attending: Nurse Practitioner | Admitting: Nurse Practitioner

## 2021-11-09 DIAGNOSIS — Z79899 Other long term (current) drug therapy: Secondary | ICD-10-CM | POA: Diagnosis not present

## 2021-11-09 DIAGNOSIS — R0689 Other abnormalities of breathing: Secondary | ICD-10-CM | POA: Diagnosis not present

## 2021-11-09 DIAGNOSIS — I672 Cerebral atherosclerosis: Secondary | ICD-10-CM | POA: Diagnosis not present

## 2021-11-09 DIAGNOSIS — Z91040 Latex allergy status: Secondary | ICD-10-CM | POA: Diagnosis not present

## 2021-11-09 DIAGNOSIS — T675XXA Heat exhaustion, unspecified, initial encounter: Secondary | ICD-10-CM | POA: Diagnosis not present

## 2021-11-09 DIAGNOSIS — R55 Syncope and collapse: Secondary | ICD-10-CM | POA: Diagnosis not present

## 2021-11-09 DIAGNOSIS — Z7982 Long term (current) use of aspirin: Secondary | ICD-10-CM | POA: Diagnosis not present

## 2021-11-09 DIAGNOSIS — I959 Hypotension, unspecified: Secondary | ICD-10-CM | POA: Diagnosis not present

## 2021-12-11 DIAGNOSIS — C50911 Malignant neoplasm of unspecified site of right female breast: Secondary | ICD-10-CM | POA: Diagnosis not present

## 2021-12-31 DIAGNOSIS — Z6829 Body mass index (BMI) 29.0-29.9, adult: Secondary | ICD-10-CM | POA: Diagnosis not present

## 2021-12-31 DIAGNOSIS — M064 Inflammatory polyarthropathy: Secondary | ICD-10-CM | POA: Diagnosis not present

## 2021-12-31 DIAGNOSIS — M79641 Pain in right hand: Secondary | ICD-10-CM | POA: Diagnosis not present

## 2021-12-31 DIAGNOSIS — E663 Overweight: Secondary | ICD-10-CM | POA: Diagnosis not present

## 2021-12-31 DIAGNOSIS — R52 Pain, unspecified: Secondary | ICD-10-CM | POA: Diagnosis not present

## 2021-12-31 DIAGNOSIS — M25562 Pain in left knee: Secondary | ICD-10-CM | POA: Diagnosis not present

## 2021-12-31 DIAGNOSIS — M79642 Pain in left hand: Secondary | ICD-10-CM | POA: Diagnosis not present

## 2021-12-31 DIAGNOSIS — M25512 Pain in left shoulder: Secondary | ICD-10-CM | POA: Diagnosis not present

## 2021-12-31 DIAGNOSIS — M1991 Primary osteoarthritis, unspecified site: Secondary | ICD-10-CM | POA: Diagnosis not present

## 2022-01-08 DIAGNOSIS — I10 Essential (primary) hypertension: Secondary | ICD-10-CM | POA: Diagnosis not present

## 2022-01-08 DIAGNOSIS — E119 Type 2 diabetes mellitus without complications: Secondary | ICD-10-CM | POA: Diagnosis not present

## 2022-01-08 DIAGNOSIS — E782 Mixed hyperlipidemia: Secondary | ICD-10-CM | POA: Diagnosis not present

## 2022-01-08 DIAGNOSIS — N1831 Chronic kidney disease, stage 3a: Secondary | ICD-10-CM | POA: Diagnosis not present

## 2022-01-30 DIAGNOSIS — M25562 Pain in left knee: Secondary | ICD-10-CM | POA: Diagnosis not present

## 2022-01-30 DIAGNOSIS — M25512 Pain in left shoulder: Secondary | ICD-10-CM | POA: Diagnosis not present

## 2022-01-30 DIAGNOSIS — R52 Pain, unspecified: Secondary | ICD-10-CM | POA: Diagnosis not present

## 2022-01-30 DIAGNOSIS — M79641 Pain in right hand: Secondary | ICD-10-CM | POA: Diagnosis not present

## 2022-01-30 DIAGNOSIS — M79642 Pain in left hand: Secondary | ICD-10-CM | POA: Diagnosis not present

## 2022-01-30 DIAGNOSIS — M1991 Primary osteoarthritis, unspecified site: Secondary | ICD-10-CM | POA: Diagnosis not present

## 2022-01-30 DIAGNOSIS — E663 Overweight: Secondary | ICD-10-CM | POA: Diagnosis not present

## 2022-01-30 DIAGNOSIS — Z6829 Body mass index (BMI) 29.0-29.9, adult: Secondary | ICD-10-CM | POA: Diagnosis not present

## 2022-01-30 DIAGNOSIS — M064 Inflammatory polyarthropathy: Secondary | ICD-10-CM | POA: Diagnosis not present

## 2022-03-10 DIAGNOSIS — M25512 Pain in left shoulder: Secondary | ICD-10-CM | POA: Diagnosis not present

## 2022-03-10 DIAGNOSIS — M064 Inflammatory polyarthropathy: Secondary | ICD-10-CM | POA: Diagnosis not present

## 2022-03-10 DIAGNOSIS — M79642 Pain in left hand: Secondary | ICD-10-CM | POA: Diagnosis not present

## 2022-03-10 DIAGNOSIS — E663 Overweight: Secondary | ICD-10-CM | POA: Diagnosis not present

## 2022-03-10 DIAGNOSIS — M79641 Pain in right hand: Secondary | ICD-10-CM | POA: Diagnosis not present

## 2022-03-10 DIAGNOSIS — M1991 Primary osteoarthritis, unspecified site: Secondary | ICD-10-CM | POA: Diagnosis not present

## 2022-03-10 DIAGNOSIS — R52 Pain, unspecified: Secondary | ICD-10-CM | POA: Diagnosis not present

## 2022-03-10 DIAGNOSIS — M25562 Pain in left knee: Secondary | ICD-10-CM | POA: Diagnosis not present

## 2022-03-10 DIAGNOSIS — Z6829 Body mass index (BMI) 29.0-29.9, adult: Secondary | ICD-10-CM | POA: Diagnosis not present

## 2022-03-14 DIAGNOSIS — E119 Type 2 diabetes mellitus without complications: Secondary | ICD-10-CM | POA: Diagnosis not present

## 2022-03-14 DIAGNOSIS — E782 Mixed hyperlipidemia: Secondary | ICD-10-CM | POA: Diagnosis not present

## 2022-03-14 DIAGNOSIS — I10 Essential (primary) hypertension: Secondary | ICD-10-CM | POA: Diagnosis not present

## 2022-03-14 DIAGNOSIS — N1831 Chronic kidney disease, stage 3a: Secondary | ICD-10-CM | POA: Diagnosis not present

## 2022-03-27 DIAGNOSIS — M25561 Pain in right knee: Secondary | ICD-10-CM | POA: Diagnosis not present

## 2022-03-27 DIAGNOSIS — M25562 Pain in left knee: Secondary | ICD-10-CM | POA: Diagnosis not present

## 2022-04-17 ENCOUNTER — Telehealth: Payer: Self-pay | Admitting: Cardiovascular Disease

## 2022-04-17 NOTE — Telephone Encounter (Signed)
Pt c/o medication issue:  1. Name of Medication: lisinopril-hydrochlorothiazide (PRINZIDE,ZESTORETIC) 20-12.5 MG tablet   2. How are you currently taking this medication (dosage and times per day)? Depends  3. Are you having a reaction (difficulty breathing--STAT)? Yes  4. What is your medication issue? When patient saw PCP she had hypotension so she was advised to only take 1/2 tablet of medication. Patient states when she takes 1/2 tablet of the medication it still gets too low sometimes causing her to not take it the next day. It also gets too high sometimes causing her to take a full tablet. Patient is unsure what to do with fluctuating BP on this medication. Please advise.

## 2022-04-18 NOTE — Telephone Encounter (Signed)
Returned call to patient to get a BP log to establish how her BP has been trending. She states that she checks her BP daily, but does it whenever she remembers so the time of day is not consistent.  1/19 127/77 1/18  100/71, 92/69 (sometime later that same day) Patient states that she didn't know her BP was low, but states she has felt so tired "like she had a hunk on her back."  States daughter brought her the Bp cuff after the appt so she cannot account for what her readings were prior. Advised patient until we hear back from Dr Burt Knack, to check her BP each morning and if 130 or greater systolic, to take 1/2 tablet (lisinopril/hctz 20/12.5). If not at least 185mhg, hold tablet. Also asked that if she does take the 1/2, to check BP again 2 hours later and if still 130 or more, take additional half tablet. She will keep BP log for the next several days. Both she and her son (present at time of call) do verbal read-back of instructions.  Routing to MD for review. Pt may need to come off combination tablet, but may not be able to assess until we have more conclusive BP trend via pt recordings.

## 2022-04-25 NOTE — Telephone Encounter (Signed)
If BP running that low probably best to hold lisinopril/HCT altogether. Check BP 3 days/week off of the medication and call us in a few weeks to let us know how BP is running. thx

## 2022-04-28 NOTE — Telephone Encounter (Signed)
Called patient and informed of recommendation to stop Lisinopril/HCTZ altogether. She states that her husband just died yesterday and it was running a little high at one point yesterday, but prior to, has still been running low. Made awar that the new onset stress will likely have her pressure running higher. She still agrees that she wants to try without it. She will write down her BP readings approx. 3 times weekly and I will call her for this log in a few weeks.

## 2022-05-12 NOTE — Telephone Encounter (Signed)
Reached out to patient for BP log. No answer. Left detailed message asking her to call back with information.

## 2022-05-13 DIAGNOSIS — M25562 Pain in left knee: Secondary | ICD-10-CM | POA: Diagnosis not present

## 2022-05-13 DIAGNOSIS — M25561 Pain in right knee: Secondary | ICD-10-CM | POA: Diagnosis not present

## 2022-05-15 ENCOUNTER — Encounter (HOSPITAL_COMMUNITY): Payer: Self-pay

## 2022-05-15 ENCOUNTER — Emergency Department (HOSPITAL_COMMUNITY): Payer: Medicare HMO

## 2022-05-15 ENCOUNTER — Emergency Department (HOSPITAL_COMMUNITY)
Admission: EM | Admit: 2022-05-15 | Discharge: 2022-05-15 | Disposition: A | Discharge status: Home / Self Care | Payer: Medicare HMO | Attending: Emergency Medicine | Admitting: Emergency Medicine

## 2022-05-15 DIAGNOSIS — E119 Type 2 diabetes mellitus without complications: Secondary | ICD-10-CM | POA: Diagnosis not present

## 2022-05-15 DIAGNOSIS — R0602 Shortness of breath: Secondary | ICD-10-CM | POA: Insufficient documentation

## 2022-05-15 DIAGNOSIS — R55 Syncope and collapse: Secondary | ICD-10-CM | POA: Insufficient documentation

## 2022-05-15 DIAGNOSIS — I2510 Atherosclerotic heart disease of native coronary artery without angina pectoris: Secondary | ICD-10-CM | POA: Insufficient documentation

## 2022-05-15 DIAGNOSIS — R7989 Other specified abnormal findings of blood chemistry: Secondary | ICD-10-CM | POA: Insufficient documentation

## 2022-05-15 DIAGNOSIS — Z79899 Other long term (current) drug therapy: Secondary | ICD-10-CM | POA: Insufficient documentation

## 2022-05-15 DIAGNOSIS — Z91040 Latex allergy status: Secondary | ICD-10-CM | POA: Insufficient documentation

## 2022-05-15 DIAGNOSIS — Z7982 Long term (current) use of aspirin: Secondary | ICD-10-CM | POA: Insufficient documentation

## 2022-05-15 DIAGNOSIS — I491 Atrial premature depolarization: Secondary | ICD-10-CM | POA: Diagnosis not present

## 2022-05-15 DIAGNOSIS — R079 Chest pain, unspecified: Secondary | ICD-10-CM | POA: Diagnosis not present

## 2022-05-15 DIAGNOSIS — U071 COVID-19: Secondary | ICD-10-CM | POA: Diagnosis not present

## 2022-05-15 DIAGNOSIS — R944 Abnormal results of kidney function studies: Secondary | ICD-10-CM | POA: Insufficient documentation

## 2022-05-15 DIAGNOSIS — R531 Weakness: Secondary | ICD-10-CM | POA: Diagnosis not present

## 2022-05-15 DIAGNOSIS — I10 Essential (primary) hypertension: Secondary | ICD-10-CM | POA: Diagnosis not present

## 2022-05-15 DIAGNOSIS — R1084 Generalized abdominal pain: Secondary | ICD-10-CM | POA: Diagnosis not present

## 2022-05-15 DIAGNOSIS — R000 Tachycardia, unspecified: Secondary | ICD-10-CM | POA: Diagnosis not present

## 2022-05-15 LAB — CBC WITH DIFFERENTIAL/PLATELET
Abs Immature Granulocytes: 0.05 10*3/uL (ref 0.00–0.07)
Basophils Absolute: 0 10*3/uL (ref 0.0–0.1)
Basophils Relative: 0 %
Eosinophils Absolute: 0 10*3/uL (ref 0.0–0.5)
Eosinophils Relative: 0 %
HCT: 34 % — ABNORMAL LOW (ref 36.0–46.0)
Hemoglobin: 11 g/dL — ABNORMAL LOW (ref 12.0–15.0)
Immature Granulocytes: 1 %
Lymphocytes Relative: 10 %
Lymphs Abs: 0.9 10*3/uL (ref 0.7–4.0)
MCH: 30.9 pg (ref 26.0–34.0)
MCHC: 32.4 g/dL (ref 30.0–36.0)
MCV: 95.5 fL (ref 80.0–100.0)
Monocytes Absolute: 1 10*3/uL (ref 0.1–1.0)
Monocytes Relative: 12 %
Neutro Abs: 6.4 10*3/uL (ref 1.7–7.7)
Neutrophils Relative %: 77 %
Platelets: 147 10*3/uL — ABNORMAL LOW (ref 150–400)
RBC: 3.56 MIL/uL — ABNORMAL LOW (ref 3.87–5.11)
RDW: 13.7 % (ref 11.5–15.5)
WBC: 8.3 10*3/uL (ref 4.0–10.5)
nRBC: 0 % (ref 0.0–0.2)

## 2022-05-15 LAB — COMPREHENSIVE METABOLIC PANEL
ALT: 27 U/L (ref 0–44)
AST: 29 U/L (ref 15–41)
Albumin: 3.5 g/dL (ref 3.5–5.0)
Alkaline Phosphatase: 56 U/L (ref 38–126)
Anion gap: 11 (ref 5–15)
BUN: 19 mg/dL (ref 8–23)
CO2: 22 mmol/L (ref 22–32)
Calcium: 9.3 mg/dL (ref 8.9–10.3)
Chloride: 105 mmol/L (ref 98–111)
Creatinine, Ser: 1.28 mg/dL — ABNORMAL HIGH (ref 0.44–1.00)
GFR, Estimated: 42 mL/min — ABNORMAL LOW (ref >60)
Glucose, Bld: 106 mg/dL — ABNORMAL HIGH (ref 70–99)
Potassium: 3.7 mmol/L (ref 3.5–5.1)
Sodium: 138 mmol/L (ref 135–145)
Total Bilirubin: 0.2 mg/dL — ABNORMAL LOW (ref 0.3–1.2)
Total Protein: 6.8 g/dL (ref 6.5–8.1)

## 2022-05-15 LAB — URINALYSIS, ROUTINE W REFLEX MICROSCOPIC
Bilirubin Urine: NEGATIVE
Glucose, UA: NEGATIVE mg/dL
Hgb urine dipstick: NEGATIVE
Ketones, ur: NEGATIVE mg/dL
Leukocytes,Ua: NEGATIVE
Nitrite: NEGATIVE
Protein, ur: NEGATIVE mg/dL
Specific Gravity, Urine: 1.012 (ref 1.005–1.030)
pH: 6 (ref 5.0–8.0)

## 2022-05-15 LAB — RESP PANEL BY RT-PCR (RSV, FLU A&B, COVID)  RVPGX2
Influenza A by PCR: NEGATIVE
Influenza B by PCR: NEGATIVE
Resp Syncytial Virus by PCR: NEGATIVE
SARS Coronavirus 2 by RT PCR: POSITIVE — AB

## 2022-05-15 LAB — TROPONIN I (HIGH SENSITIVITY)
Troponin I (High Sensitivity): 11 ng/L (ref <18)
Troponin I (High Sensitivity): 5 ng/L (ref <18)

## 2022-05-15 LAB — BRAIN NATRIURETIC PEPTIDE: B Natriuretic Peptide: 114.1 pg/mL — ABNORMAL HIGH (ref 0.0–100.0)

## 2022-05-15 MED ORDER — NIRMATRELVIR/RITONAVIR (PAXLOVID) TABLET (RENAL DOSING)
2.0000 | ORAL_TABLET | Freq: Two times a day (BID) | ORAL | Status: DC
  Administered 2022-05-15: 2 via ORAL
  Filled 2022-05-15: qty 20

## 2022-05-15 NOTE — ED Notes (Signed)
Pt to imaging

## 2022-05-15 NOTE — ED Notes (Signed)
This RN assumed care of patient. EMS report given to secondary RN since this RN was unavailable at the time. Pt presented to the ED from home after a syncopal episode. Pt reports being "depressed" since her husband  died recently stating "we were suppose to die together" Pt denies SI/HI. Pt is resting in gurney at this time, respirations are spontaneous, even, unlabored and symmetrical bilaterally. Pt skin tone is appropriate for ethnicity, dry and warm. Pt connected to CCM, pulse ox and BP.

## 2022-05-15 NOTE — ED Triage Notes (Signed)
Patient bib [facility] from home. Family called out saying patient had a syncopal episode. Patient did not hit her head. Family stated she is on prednisone for an asthma flare up and they believe that they patient is just overwhelmed she just buried her husband the first of the month. VSS. Per ems patient is in [place].

## 2022-05-15 NOTE — Discharge Instructions (Signed)
You were evaluated in the emergency department for weakness, we found that you have COVID-19.  I am sending you home with a medication called Paxlovid.  Please take as prescribed for 5 days.  While taking this medication, you need to stop your Lipitor/atorvastatin.  You may start taking your Lipitor/atorvastatin after you have finished your Paxlovid.  Please return to the ED if you have any chest pain or shortness of breath/difficulty breathing, or inability to tolerate oral intake

## 2022-05-15 NOTE — ED Provider Notes (Signed)
[facility] Provider Note   CSN: [id number] Arrival date & time: 05/15/22  1604     History  Chief Complaint  Patient presents with   Loss of Consciousness    Evelyn Morrison is a 82 y.o. female.  This is a 82 year old female with history of coronary artery disease s/p stent , type 2 diabetes, GERD and hypertension presenting to the ED for syncope.  Patient reportedly had cough with congestion of the last 2 days.  Also endorses increasing weakness, had an episode of chest pain this afternoon and believes she syncopized earlier today.  She also endorses some intermittent shortness of breath, describes the chest pain as epigastric/substernal, does not radiate, nonexertional.  She called EMS and she was brought in for further evaluation.  She denies any fevers, abdominal pain, nausea or vomiting.    Home Medications Prior to Admission medications   Medication Sig Start Date End Date Taking? Authorizing Provider  Accu-Chek FastClix Lancets MISC 1 each by Other route 3 (three) times daily. 08/19/19   [provider]  ACCU-CHEK GUIDE test strip 1 each by Other route as needed. 12/15/19   [provider]  acetaminophen (TYLENOL) 500 MG tablet Take 500-1,000 mg by mouth every 6 (six) hours as needed for moderate pain.     [provider]  albuterol (PROVENTIL HFA;VENTOLIN HFA) 108 (90 Base) MCG/ACT inhaler Inhale 1-2 puffs into the lungs every 4 (four) hours as needed for wheezing or shortness of breath. 02/22/18   Wynona Luna, MD  amLODipine (NORVASC) 10 MG tablet Take 1 tablet by mouth daily. Patient not taking: Reported on 05/03/2021 07/07/20   [provider]  aspirin 81 MG EC tablet Take 81 mg by mouth every morning.     [provider]  atorvastatin (LIPITOR) 40 MG tablet Take 40 mg by mouth daily.    [provider]  Biotin 10000 MCG TABS Take by mouth.    [provider]   Cholecalciferol (D3 HIGH POTENCY) 50 MCG (2000 UT) CAPS Take 2,000 Units by mouth daily.     [provider]  ferrous sulfate 325 (65 FE) MG tablet Take 325 mg by mouth daily with breakfast.    [provider]  gabapentin (NEURONTIN) 300 MG capsule Take 300 mg by mouth 2 (two) times daily.    [provider]  lisinopril-hydrochlorothiazide (PRINZIDE,ZESTORETIC) 20-12.5 MG tablet Take 1 tablet by mouth daily.    [provider]  lovastatin (MEVACOR) 10 MG tablet Take 10 mg by mouth at bedtime. Patient not taking: Reported on 05/03/2021    [provider]  Multiple Vitamin (MULTIVITAMIN) capsule Take 2 capsules by mouth daily.    [provider]  MYRBETRIQ 25 MG TB24 tablet Take 25 mg by mouth daily.  04/25/19   [provider]  nitroGLYCERIN (NITROSTAT) 0.4 MG SL tablet Place 0.4 mg under the tongue every 5 (five) minutes as needed for chest pain (3 doses max).     [provider]  TURMERIC PO Take 1 capsule by mouth daily as needed (arthritis pain).    [provider]      Allergies    Penicillins, Ibuprofen, Lactose intolerance (gi), Milk-related compounds, Nyquil multi-symptom [pseudoeph-doxylamine-dm-apap], and Latex    Review of Systems   Review of Systems  Constitutional:  Positive for chills. Negative for fever.  HENT:  Positive for congestion.   Respiratory:  Positive for cough. Negative for shortness of breath.  Cardiovascular:  Positive for chest pain.  Gastrointestinal:  Negative for abdominal pain.  Neurological:  Positive for syncope. Negative for dizziness.    Physical Exam Updated Vital Signs BP (!) 163/80   Pulse (!) 58   Temp 98.2 F (36.8 C) (Oral)   Resp 17   Ht 5' (1.524 m)   Wt 68.9 kg   SpO2 99%   BMI 29.69 kg/m  Physical Exam HENT:     Head: Normocephalic.     Mouth/Throat:     Mouth: Mucous membranes are moist.  Eyes:     Pupils: Pupils are equal, round, and reactive to  light.  Cardiovascular:     Rate and Rhythm: Normal rate and regular rhythm.     Pulses: Normal pulses.     Heart sounds: Normal heart sounds. No murmur heard.    No friction rub. No gallop.  Pulmonary:     Effort: Pulmonary effort is normal. No respiratory distress.     Breath sounds: No stridor. No wheezing or rales.  Abdominal:     General: There is no distension.     Palpations: Abdomen is soft.     Tenderness: There is no abdominal tenderness. There is no guarding.  Musculoskeletal:     Right lower leg: No edema.     Left lower leg: No edema.  Skin:    General: Skin is warm.     Capillary Refill: Capillary refill takes less than 2 seconds.  Neurological:     General: No focal deficit present.     Mental Status: She is alert and oriented to person, place, and time.     Cranial Nerves: No cranial nerve deficit.     Motor: No weakness.     Gait: Gait normal.     ED Results / Procedures / Treatments   Labs (all labs ordered are listed, but only abnormal results are displayed) Labs Reviewed  RESP PANEL BY RT-PCR (RSV, FLU A&B, COVID)  RVPGX2 - Abnormal; Notable for the following components:      Result Value   SARS Coronavirus 2 by RT PCR POSITIVE (*)    All other components within normal limits  BRAIN NATRIURETIC PEPTIDE - Abnormal; Notable for the following components:   B Natriuretic Peptide 114.1 (*)    All other components within normal limits  COMPREHENSIVE METABOLIC PANEL - Abnormal; Notable for the following components:   Glucose, Bld 106 (*)    Creatinine, Ser 1.28 (*)    Total Bilirubin 0.2 (*)    GFR, Estimated 42 (*)    All other components within normal limits  CBC WITH DIFFERENTIAL/PLATELET - Abnormal; Notable for the following components:   RBC 3.56 (*)    Hemoglobin 11.0 (*)    HCT 34.0 (*)    Platelets 147 (*)    All other components within normal limits  URINALYSIS, ROUTINE W REFLEX MICROSCOPIC  CBC WITH DIFFERENTIAL/PLATELET  TROPONIN I (HIGH  SENSITIVITY)  TROPONIN I (HIGH SENSITIVITY)    EKG EKG Interpretation  Date/Time:  Thursday May 15 2022 17:11:10 EST Ventricular Rate:  67 PR Interval:  143 QRS Duration: 84 QT Interval:  390 QTC Calculation: 412 R Axis:   -8 Text Interpretation: Sinus rhythm Atrial premature complex Left ventricular hypertrophy No acute changes No significant change since last tracing Confirmed by Varney Biles [phone]) on 05/15/2022 7:47:09 PM  Radiology DG Chest 2 View  Result Date: 05/15/2022 CLINICAL DATA:  Chest pain and shortness of breath EXAM: CHEST - 2  VIEW COMPARISON:  06/28/2018 FINDINGS: The heart size and mediastinal contours are within normal limits. Both lungs are clear. Underinflation. The visualized skeletal structures are unremarkable. Eventration of the right hemidiaphragm. Overlapping cardiac leads. Degenerative changes seen along the spine. Electronically Signed   By: Jill Side M.D.   On: 05/15/2022 17:24    Procedures Procedures    Medications Ordered in ED Medications  nirmatrelvir/ritonavir (renal dosing) (PAXLOVID) 2 tablet (2 tablets Oral Given 05/15/22 2308)    ED Course/ Medical Decision Making/ A&P                             Medical Decision Making Patient presents with multiple complaints, initially had viral symptoms now with worsening fatigue and some intermittent chest pain.  She has history of coronary artery disease, will obtain serial troponins and EKG.  Will also obtain chest x-ray, CBC, BMP, BNP, COVID/flu/RSV testing.  Differential diagnosis includes ACS, viral illness, pneumonia, acute heart failure exacerbation.   I personally reviewed and interpreted patient's labs, creatinine stable at 1.28, electrolytes otherwise normal, BNP mildly elevated 114, initial troponin 11, repeat troponin 5, hemoglobin stable at 11, viral testing positive for COVID-19 infection.  Although BNP mildly elevated, low suspicion for acute heart failure exacerbation given  patient is not hypoxic, no difficulty breathing, no crackles at lung bases, no JVD and no lower extremity swelling.  Troponin stable, low suspicion for ACS.  I personally reviewed and interpreted patient's chest x-ray and agree with radiology which shows no focal consolidation to suggest pneumonia, cardiac silhouette within normal limits, no pulmonary edema.  I personally reviewed and interpreted patient's EKG which shows sinus rhythm with rate 67, PR, QRS, QTc normal, no acute ischemic changes.  Discussed patient's medication list with pharmacy as she is in the window for Paxlovid.  Decision to hold patient's atorvastatin for 5 days while we treat her with Paxlovid.  We will give her a dose here and send her home with a pack for 5 days.  She no longer takes amlodipine, we do not need to hold this medication.  Strict return precautions given if she has any chest pain or shortness of breath shortness of, or inability tolerate oral intake to return to the ED for evaluation.  I recommended she follow-up with her PCP in 3 days for reevaluation.  Her family is going to check on her frequently over the next couple days.  Patient was stable at discharge.  Problems Addressed: COVID-19: acute illness or injury that poses a threat to life or bodily functions Weakness: acute illness or injury that poses a threat to life or bodily functions  Amount and/or Complexity of Data Reviewed External Data Reviewed: notes.    Details: Shortness follows with cardiology last seen on 05/03/2021. Labs: ordered. Decision-making details documented in ED Course. Radiology: ordered and independent interpretation performed. Decision-making details documented in ED Course. ECG/medicine tests: ordered and independent interpretation performed. Decision-making details documented in ED Course.         Final Clinical Impression(s) / ED Diagnoses Final diagnoses:  COVID-19  Weakness    Rx / DC Orders ED Discharge Orders      None         Jimmie Molly, MD 05/15/22 [place], Ankit, MD 05/20/22 [phone]

## 2022-05-15 NOTE — ED Notes (Signed)
Resent lavender and mint tubes

## 2022-05-20 NOTE — Telephone Encounter (Signed)
Returned call to patient who is upset. Repeatedly stating that her husband died and since then her blood pressure has been "going up and down and all over the place." At one point during call she states that she has been taking medication when she checks her BP and it's high. She has no BP log to provide, states that "its all too much right now." She also states she has COVID right now and she is "just not doing good." States she will start checking her blood pressure and reach out to [place] if needed. Also states she will try to call back in 1 week to report her readings.

## 2022-05-29 ENCOUNTER — Telehealth: Payer: Self-pay | Admitting: Cardiovascular Disease

## 2022-05-29 NOTE — Telephone Encounter (Signed)
Pt calling back as requested regarding BP readings. Please advise.

## 2022-05-29 NOTE — Telephone Encounter (Signed)
Patient calling and states she was informed to stop taking lisinopril because her BP was low and record her  BP everyday. She stated she has stop taking lisinopril and her readings are below. She stated she did skip some days. Her next appointment is not until April. She would like to know if she needs to start back taking her lisinopril again. Please advise.  2/6-127/83 hr 91 18:30 2/7- 141/91 hr 88 11:34 am 2/8 135/88 hr 89 16:12 2/13- 140/90 no hr 11:30am 2/20- 156/96 hr 88 12:00 pm 2/22- 138/92 hr 97 15:15 2/26- 137/87 hr 99 13:35  2/28- 144/92 hr 95 17:00 2/29- 155/96 hr 92 14:55

## 2022-05-30 DIAGNOSIS — Z1231 Encounter for screening mammogram for malignant neoplasm of breast: Secondary | ICD-10-CM | POA: Diagnosis not present

## 2022-05-30 NOTE — Telephone Encounter (Signed)
Returned call to patient to inform that based on reported BP numbers, she should resume her Lisinopril and continue keeping a BP log and report to [place] of any additional concerns. She will see [place] next month. Verbalized understanding.

## 2022-06-09 DIAGNOSIS — R52 Pain, unspecified: Secondary | ICD-10-CM | POA: Diagnosis not present

## 2022-06-09 DIAGNOSIS — M25562 Pain in left knee: Secondary | ICD-10-CM | POA: Diagnosis not present

## 2022-06-09 DIAGNOSIS — Z6828 Body mass index (BMI) 28.0-28.9, adult: Secondary | ICD-10-CM | POA: Diagnosis not present

## 2022-06-09 DIAGNOSIS — M79641 Pain in right hand: Secondary | ICD-10-CM | POA: Diagnosis not present

## 2022-06-09 DIAGNOSIS — M064 Inflammatory polyarthropathy: Secondary | ICD-10-CM | POA: Diagnosis not present

## 2022-06-09 DIAGNOSIS — M1991 Primary osteoarthritis, unspecified site: Secondary | ICD-10-CM | POA: Diagnosis not present

## 2022-06-09 DIAGNOSIS — E663 Overweight: Secondary | ICD-10-CM | POA: Diagnosis not present

## 2022-06-16 DIAGNOSIS — F439 Reaction to severe stress, unspecified: Secondary | ICD-10-CM | POA: Diagnosis not present

## 2022-06-16 DIAGNOSIS — H9193 Unspecified hearing loss, bilateral: Secondary | ICD-10-CM | POA: Diagnosis not present

## 2022-06-16 DIAGNOSIS — I10 Essential (primary) hypertension: Secondary | ICD-10-CM | POA: Diagnosis not present

## 2022-06-16 DIAGNOSIS — E782 Mixed hyperlipidemia: Secondary | ICD-10-CM | POA: Diagnosis not present

## 2022-06-16 DIAGNOSIS — E119 Type 2 diabetes mellitus without complications: Secondary | ICD-10-CM | POA: Diagnosis not present

## 2022-06-16 DIAGNOSIS — Z0000 Encounter for general adult medical examination without abnormal findings: Secondary | ICD-10-CM | POA: Diagnosis not present

## 2022-06-16 DIAGNOSIS — N1831 Chronic kidney disease, stage 3a: Secondary | ICD-10-CM | POA: Diagnosis not present

## 2022-06-16 DIAGNOSIS — R3915 Urgency of urination: Secondary | ICD-10-CM | POA: Diagnosis not present

## 2022-06-17 DIAGNOSIS — M1990 Unspecified osteoarthritis, unspecified site: Secondary | ICD-10-CM | POA: Diagnosis not present

## 2022-06-17 DIAGNOSIS — D631 Anemia in chronic kidney disease: Secondary | ICD-10-CM | POA: Diagnosis not present

## 2022-06-17 DIAGNOSIS — E559 Vitamin D deficiency, unspecified: Secondary | ICD-10-CM | POA: Diagnosis not present

## 2022-06-17 DIAGNOSIS — I129 Hypertensive chronic kidney disease with stage 1 through stage 4 chronic kidney disease, or unspecified chronic kidney disease: Secondary | ICD-10-CM | POA: Diagnosis not present

## 2022-06-17 DIAGNOSIS — R399 Unspecified symptoms and signs involving the genitourinary system: Secondary | ICD-10-CM | POA: Diagnosis not present

## 2022-06-17 DIAGNOSIS — N1832 Chronic kidney disease, stage 3b: Secondary | ICD-10-CM | POA: Diagnosis not present

## 2022-06-18 ENCOUNTER — Other Ambulatory Visit: Payer: Self-pay | Admitting: Nephrology

## 2022-06-18 DIAGNOSIS — R3989 Other symptoms and signs involving the genitourinary system: Secondary | ICD-10-CM

## 2022-06-18 DIAGNOSIS — N1832 Chronic kidney disease, stage 3b: Secondary | ICD-10-CM

## 2022-07-16 ENCOUNTER — Ambulatory Visit
Admission: RE | Admit: 2022-07-16 | Discharge: 2022-07-16 | Disposition: A | Discharge status: Home / Self Care | Payer: Medicare HMO | Source: Ambulatory Visit | Attending: Nephrology | Admitting: Nephrology

## 2022-07-16 DIAGNOSIS — N1832 Chronic kidney disease, stage 3b: Secondary | ICD-10-CM

## 2022-07-16 DIAGNOSIS — R3989 Other symptoms and signs involving the genitourinary system: Secondary | ICD-10-CM

## 2022-07-16 DIAGNOSIS — N189 Chronic kidney disease, unspecified: Secondary | ICD-10-CM | POA: Diagnosis not present

## 2022-07-17 ENCOUNTER — Encounter: Payer: Self-pay | Admitting: Cardiovascular Disease

## 2022-07-17 ENCOUNTER — Ambulatory Visit: Payer: Medicare HMO | Attending: Cardiovascular Disease | Admitting: Cardiovascular Disease

## 2022-07-17 VITALS — BP 126/82 | HR 88 | Ht 61.0 in | Wt 149.2 lb

## 2022-07-17 DIAGNOSIS — I25118 Atherosclerotic heart disease of native coronary artery with other forms of angina pectoris: Secondary | ICD-10-CM | POA: Diagnosis not present

## 2022-07-17 DIAGNOSIS — I10 Essential (primary) hypertension: Secondary | ICD-10-CM

## 2022-07-17 DIAGNOSIS — E782 Mixed hyperlipidemia: Secondary | ICD-10-CM | POA: Diagnosis not present

## 2022-07-17 DIAGNOSIS — E119 Type 2 diabetes mellitus without complications: Secondary | ICD-10-CM

## 2022-07-17 NOTE — Patient Instructions (Signed)
Medication Instructions:  Your physician recommends that you continue on your current medications as directed. Please refer to the Current Medication list given to you today.  *If you need a refill on your cardiac medications before your next appointment, please call your pharmacy*   Lab Work: CMET, Lipids, A1C today If you have labs (blood work) drawn today and your tests are completely normal, you will receive your results only by: MyChart Message (if you have MyChart) OR A paper copy in the mail If you have any lab test that is abnormal or we need to change your treatment, we will call you to review the results.   Testing/Procedures: NONE   Follow-Up: At [facility], you and your health needs are our priority.  As part of our continuing mission to provide you with exceptional heart care, we have created designated Provider Care Teams.  These Care Teams include your primary Cardiologist (physician) and Advanced Practice Providers (APPs -  Physician Assistants and Nurse Practitioners) who all work together to provide you with the care you need, when you need it.  We recommend signing up for the patient portal called "MyChart".  Sign up information is provided on this After Visit Summary.  MyChart is used to connect with patients for Virtual Visits (Telemedicine).  Patients are able to view lab/test results, encounter notes, upcoming appointments, etc.  Non-urgent messages can be sent to your provider as well.   To learn more about what you can do with MyChart, go to [email or web address].    Your next appointment:   1 year(s)  Provider:   Tonny Bollman, MD

## 2022-07-17 NOTE — Progress Notes (Signed)
Cardiology Office Note:    Date:  07/17/2022   ID:  Kaden Dunkel, DOB 1940-12-18, MRN [id number]  PCP:  Jackie Plum, MD   [facility] Providers Cardiologist:  Tonny Bollman, MD     Referring MD: Jackie Plum, MD   Chief Complaint  Patient presents with   Coronary Artery Disease    History of Present Illness:    Evelyn Morrison is a 82 y.o. female with a hx of coronary artery disease, status post remote MI.  She underwent PCI at the time of her infarct in 2004, treated at another health system.  She has had multiple stress tests over the years, most recently in 2019 when a Lexiscan Myoview showed no ischemia or infarction with normal LVEF.  The patient is here alone today.  Her husband died about 10 weeks ago after a prolonged illness.  She is living with her daughter.  She has been doing okay and denies any recent symptoms of chest pain or pressure.  She has mild exertional dyspnea that she states has not changed over the last several years.  No orthopnea, PND or leg swelling.  No lightheadedness or syncope.    Past Medical History:  Diagnosis Date   Anemia    Anxiety    Arthritis    CAD (coronary artery disease)    Myoview 12/17: EF 66, apical and apical lateral defect - likely attenuation artifact, no ischemia; Low Risk // Myoview 3/19:  EF 74, no ischemia or scar; low risk    Cataract, diabetic    Chest pain    Chronic kidney disease    stage 3   Constipation    Contusion of arm    Diabetes mellitus    Diabetic retinopathy    DM (diabetes mellitus)    Dyspnea    SOb on exertion   Esophageal stricture    Family history of ovarian cancer    Fatigue    Glaucoma    Heart burn    Hiatal hernia    History of echocardiogram    Echo 3/19:  Mild LVH, EF 60-65, no RWMA, Gr 1 DD, PASP 34   HLD (hyperlipidemia)    Hypertension    Joint stiffness of hand    Myocardial infarction    2004, stent   Urinary incontinence    Vitamin D deficiency      Past Surgical History:  Procedure Laterality Date   adenosine cardillite  05/16/05   negative signif. myocardial ischemic   BREAST LUMPECTOMY WITH RADIOACTIVE SEED LOCALIZATION Right 07/29/2019   Procedure: RIGHT BREAST LUMPECTOMY WITH RADIOACTIVE SEED LOCALIZATION;  Surgeon: Almond Lint, MD;  Location: [facility];  Service: General;  Laterality: Right;   CARDIAC CATHETERIZATION  10/04   s/p stent LV 45%   CATARACT EXTRACTION     bilateral   CESAREAN SECTION  84, 86   ectopic pregnancy   CHOLECYSTECTOMY  1998   CORONARY ANGIOPLASTY WITH STENT PLACEMENT  10/04   PARTIAL HYSTERECTOMY     RE-EXCISION OF BREAST LUMPECTOMY Right 08/31/2019   Procedure: RE-EXCISION OF RIGHT BREAST LUMPECTOMY;  Surgeon: Almond Lint, MD;  Location: [facility];  Service: General;  Laterality: Right;    Current Medications: Current Meds  Medication Sig   Accu-Chek FastClix Lancets MISC 1 each by Other route 3 (three) times daily.   ACCU-CHEK GUIDE test strip 1 each by Other route as needed.   acetaminophen (TYLENOL) 500 MG tablet Take 500-1,000 mg by mouth every 6 (six)  hours as needed for moderate pain.    albuterol (PROVENTIL HFA;VENTOLIN HFA) 108 (90 Base) MCG/ACT inhaler Inhale 1-2 puffs into the lungs every 4 (four) hours as needed for wheezing or shortness of breath.   aspirin 81 MG EC tablet Take 81 mg by mouth every morning.    atorvastatin (LIPITOR) 40 MG tablet Take 40 mg by mouth daily.   Biotin 91478 MCG TABS Take by mouth.   Cholecalciferol (D3 HIGH POTENCY) 50 MCG (2000 UT) CAPS Take 2,000 Units by mouth daily.    ferrous sulfate 325 (65 FE) MG tablet Take 325 mg by mouth daily with breakfast.   gabapentin (NEURONTIN) 300 MG capsule Take 300 mg by mouth 2 (two) times daily.   hydrOXYzine (VISTARIL) 25 MG capsule Take 25 mg by mouth at bedtime.   lisinopril-hydrochlorothiazide (PRINZIDE,ZESTORETIC) 20-12.5 MG tablet Take 1 tablet by mouth daily.   Multiple Vitamin (MULTIVITAMIN)  capsule Take 2 capsules by mouth daily.   nitroGLYCERIN (NITROSTAT) 0.4 MG SL tablet Place 0.4 mg under the tongue every 5 (five) minutes as needed for chest pain (3 doses max).    TURMERIC PO Take 1 capsule by mouth daily as needed (arthritis pain).   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  Tobacco Use   Smoking status: Former    Packs/day: 0.25    Years: 15.00    Additional pack years: 0.00    Total pack years: 3.75    Types: Cigarettes    Quit date: 03/31/1990    Years since quitting: 32.3   Smokeless tobacco: Never  Substance and Sexual Activity   Alcohol use: No    Comment: ocassional   Drug use: No   Sexual activity: Not on file  Other Topics Concern   Not on file  Social History Narrative   2 caffeine drinks daily    Social Determinants of Health   Financial Resource Strain: Not on file  Food Insecurity: Not on file  Transportation Needs: Not on file  Physical Activity: Not on file  Stress: Not on file  Social Connections: Not on file     Family History: The patient's family history includes Crohn's disease in her brother, brother, and brother; Diabetes in an other family member; Heart attack in her father; Heart disease in her mother; Ovarian cancer (age of onset: 35) in her cousin. There is no history of Colon cancer.  ROS:   Please see the history of present illness.    All other systems reviewed and are  negative.  EKGs/Labs/Other Studies Reviewed:    The following studies were reviewed today: Cardiac Studies & Procedures     STRESS TESTS  MYOCARDIAL PERFUSION IMAGING 05/29/2017  Narrative  Nuclear stress EF: 74%.  There was no ST segment deviation noted during stress.  The study is normal.  This is a low risk study.  The left ventricular ejection fraction is hyperdynamic (>65%).  Normal stress nuclear study with no ischemia  or infarction; EF 74 with normal wall motion.   ECHOCARDIOGRAM  ECHOCARDIOGRAM COMPLETE 05/29/2017  Narrative *[facility]* [address] [place], [place] [address] [phone]  ------------------------------------------------------------------- Transthoracic Echocardiography  Patient:    Shristi, Scheib MR #:       [id number] Study Date: 05/29/2017 Gender:     F Age:        76 Height:     154.9 cm Weight:     68.6 kg BSA:        1.74 m^2 Pt. Status: Room:  SONOGRAPHER  Randa Evens, Will Doreene Adas, Scott Royston Bake T ATTENDING    Marca Ancona, M.D. PERFORMING   [facility], Outpatient  cc:  ------------------------------------------------------------------- LV EF: 60% -   65%  ------------------------------------------------------------------- Indications:      (R06.02).  ------------------------------------------------------------------- History:   PMH:  Acquired from the patient and from the patient&'s chart.  Chest pain.  Dyspnea.  Coronary artery disease.  Risk factors:  Hypertension. Diabetes mellitus. Dyslipidemia.  ------------------------------------------------------------------- Study Conclusions  - Left ventricle: The cavity size was normal. Wall thickness was increased in a pattern of mild LVH. Systolic function was normal. The estimated ejection fraction was in the range of 60% to 65%. Wall motion was normal; there were no regional wall motion abnormalities. Doppler parameters are  consistent with abnormal left ventricular relaxation (grade 1 diastolic dysfunction). - Aortic valve: There was no stenosis. - Mitral valve: There was no significant regurgitation. - Right ventricle: The cavity size was normal. Systolic function was normal. - Tricuspid valve: Peak RV-RA gradient (S): 31 mm Hg. - Pulmonary arteries: PA peak pressure: 34 mm Hg (S). - Inferior vena cava: The vessel was normal in size. The respirophasic diameter changes were in the normal range (>= 50%), consistent with normal central venous pressure.  Impressions:  - Normal LV size with mild LV hypertrophy. EF 60-65%. Normal RV size and systolic function. No significant valvular abnormalities.  ------------------------------------------------------------------- Study data:  No prior study was available for comparison.  Study status:  Routine.  Procedure:  The patient reported no pain pre or post test. Transthoracic echocardiography for left ventricular function evaluation. Image quality was adequate.  Study completion: There were no complications.          Transthoracic echocardiography.  M-mode, complete 2D, spectral Doppler, and color Doppler.  Birthdate:  Patient birthdate: [birth year].  Age:  Patient is 82 yr old.  Sex:  Gender: female.    BMI: 28.6 kg/m^2.  Blood pressure:     122/70  Patient status:  Outpatient.  Study date: Study date: 05/29/2017. Study time: 07:50 AM.  Location:  [facility]  -------------------------------------------------------------------  ------------------------------------------------------------------- Left ventricle:  The cavity size was normal. Wall thickness was increased in a pattern of mild LVH. Systolic function was normal. The estimated ejection fraction was in the range of 60% to 65%. Wall motion was normal; there were no regional wall motion abnormalities. Doppler parameters are consistent with abnormal left ventricular relaxation (grade 1 diastolic  dysfunction).  ------------------------------------------------------------------- Aortic valve:   Trileaflet; mildly calcified leaflets.  Doppler: There was no stenosis.   There was no regurgitation.  ------------------------------------------------------------------- Aorta:  Aortic root: The aortic root was normal in size. Ascending aorta: The ascending aorta was normal in size.  ------------------------------------------------------------------- Mitral valve:   Normal thickness leaflets .  Doppler:   There was no evidence for stenosis.   There was no significant regurgitation.  ------------------------------------------------------------------- Left atrium:  The atrium was normal in size.  ------------------------------------------------------------------- Right ventricle:  The cavity size was normal. Systolic function was normal.  ------------------------------------------------------------------- Pulmonic valve:    Structurally normal valve.   Cusp separation was normal.  Doppler:  Transvalvular velocity was within the normal range. There was trivial regurgitation.  ------------------------------------------------------------------- Tricuspid valve:   Doppler:  There was mild regurgitation.  ------------------------------------------------------------------- Right atrium:  The atrium was normal in size.  ------------------------------------------------------------------- Pericardium:  There was no pericardial effusion.  ------------------------------------------------------------------- Systemic veins: Inferior vena cava: The vessel was normal in size. The respirophasic diameter changes were in the normal range (>= 50%), consistent with normal central venous pressure.  ------------------------------------------------------------------- Measurements  Left ventricle                           Value        Reference LV ID, ED, PLAX chordal          (L)     39.9  mm      43 - 52 LV ID, ES, PLAX chordal                  28.6  mm     23 - 38 LV fx shortening, PLAX chordal   (L)     28    %      >=29 LV PW thickness, ED                      8.02  mm     --------- IVS/LV PW ratio, ED              (H)     1.55         <=1.3 Stroke volume, 2D                        50    ml     --------- Stroke volume/bsa, 2D                    29    ml/m^2 --------- LV ejection fraction, 1-p A4C            56    %      --------- LV end-diastolic volume, 2-p             48    ml     --------- LV end-systolic volume, 2-p              22    ml     --------- LV ejection fraction, 2-p                54    %      --------- Stroke volume, 2-p                       26    ml     --------- LV end-diastolic volume/bsa, 2-p         28    ml/m^2 --------- LV end-systolic volume/bsa, 2-p          13    ml/m^2 --------- Stroke volume/bsa, 2-p                   14.9  ml/m^2 --------- LV e&', lateral  8.97  cm/s   --------- LV E/e&', lateral                         7.27         --------- LV e&', medial                            6.73  cm/s   --------- LV E/e&', medial                          9.69         --------- LV e&', average                           7.85  cm/s   --------- LV E/e&', average                         8.31         ---------  Ventricular septum                       Value        Reference IVS thickness, ED                        12.4  mm     ---------  LVOT                                     Value        Reference LVOT ID, S                               17    mm     --------- LVOT area                                2.27  cm^2   --------- LVOT ID                                  17    mm     --------- LVOT peak velocity, S                    101   cm/s   --------- LVOT mean velocity, S                    64    cm/s   --------- LVOT VTI, S                              22    cm     --------- LVOT peak gradient, S                    4     mm Hg   --------- Stroke volume (SV), LVOT DP              49.9  ml     --------- Stroke index (SV/bsa), LVOT DP  28.7  ml/m^2 ---------  Aorta                                    Value        Reference Aortic root ID, ED                       30    mm     --------- Ascending aorta ID, A-P, S               32    mm     ---------  Left atrium                              Value        Reference LA ID, A-P, ES                           31    mm     --------- LA ID/bsa, A-P                           1.78  cm/m^2 <=2.2 LA volume, S                             29    ml     --------- LA volume/bsa, S                         16.7  ml/m^2 --------- LA volume, ES, 1-p A4C                   25    ml     --------- LA volume/bsa, ES, 1-p A4C               14.4  ml/m^2 --------- LA volume, ES, 1-p A2C                   31    ml     --------- LA volume/bsa, ES, 1-p A2C               17.8  ml/m^2 ---------  Mitral valve                             Value        Reference Mitral E-wave peak velocity              65.2  cm/s   --------- Mitral A-wave peak velocity              91.8  cm/s   --------- Mitral deceleration time         (H)     257   ms     150 - 230 Mitral E/A ratio, peak                   0.7          ---------  Pulmonary arteries                       Value        Reference PA pressure, S, DP               (  H)     34    mm Hg  <=30  Tricuspid valve                          Value        Reference Tricuspid regurg peak velocity           278   cm/s   --------- Tricuspid peak RV-RA gradient            31    mm Hg  ---------  Systemic veins                           Value        Reference Estimated CVP                            3     mm Hg  ---------  Right ventricle                          Value        Reference RV s&', lateral, S                        14.8  cm/s   ---------  Legend: (L)  and  (H)  mark values outside specified reference  range.  ------------------------------------------------------------------- Prepared and Electronically Authenticated by  Marca Ancona, M.D. 2019-03-01T16:52:18              EKG:  EKG is not ordered today.    Recent Labs: 05/15/2022: ALT 27; B Natriuretic Peptide 114.1; BUN 19; Creatinine, Ser 1.28; Hemoglobin 11.0; Platelets 147; Potassium 3.7; Sodium 138  Recent Lipid Panel    Component Value Date/Time   CHOL 148 05/03/2021 1136   TRIG 113 05/03/2021 1136   HDL 67 05/03/2021 1136   CHOLHDL 2.2 05/03/2021 1136   CHOLHDL 2.3 03/10/2016 1008   VLDL 25 03/10/2016 1008   LDLCALC 61 05/03/2021 1136     Risk Assessment/Calculations:                Physical Exam:    VS:  BP 126/82   Pulse 88   Ht 5\' 1"  (1.549 m)   Wt 149 lb 3.2 oz (67.7 kg)   SpO2 97%   BMI 28.19 kg/m     Wt Readings from Last 3 Encounters:  07/17/22 149 lb 3.2 oz (67.7 kg)  05/15/22 152 lb (68.9 kg)  05/03/21 154 lb 3.2 oz (69.9 kg)     GEN:  Well nourished, well developed in no acute distress HEENT: Normal NECK: No JVD; No carotid bruits LYMPHATICS: No lymphadenopathy CARDIAC: RRR, no murmurs, rubs, gallops RESPIRATORY:  Clear to auscultation without rales, wheezing or rhonchi  ABDOMEN: Soft, non-tender, non-distended MUSCULOSKELETAL:  No edema; No deformity  SKIN: Warm and dry NEUROLOGIC:  Alert and oriented x 3 PSYCHIATRIC:  Normal affect   ASSESSMENT:    1. Coronary artery disease of native artery of native heart with stable angina pectoris   2. Type 2 diabetes mellitus without complication, without long-term current use of insulin   3. Mixed hyperlipidemia   4. Essential (primary) hypertension    PLAN:    In order of problems listed above:  The patient is stable on her current medical regimen.  She has not had any recent issues  with chest pain or pressure.  She remains on aspirin for antiplatelet therapy and a high intensity statin drug.  Also treated with an ACE inhibitor in  the setting of CAD and diabetes. Check hemoglobin A1c today.  Not currently on any oral hypoglycemic medications because of hypoglycemia. Treated with atorvastatin.  Last lipids from 2023 showed a cholesterol of 148 and an LDL of 61.  Continue same therapy. Blood pressure is well-controlled on lisinopril/HCTZ.     Medication Adjustments/Labs and Tests Ordered: Current medicines are reviewed at length with the patient today.  Concerns regarding medicines are outlined above.  Orders Placed This Encounter  Procedures   Comprehensive metabolic panel   Lipid panel   Hemoglobin A1c   No orders of the defined types were placed in this encounter.   Patient Instructions  Medication Instructions:  Your physician recommends that you continue on your current medications as directed. Please refer to the Current Medication list given to you today.  *If you need a refill on your cardiac medications before your next appointment, please call your pharmacy*   Lab Work: CMET, Lipids, A1C today If you have labs (blood work) drawn today and your tests are completely normal, you will receive your results only by: MyChart Message (if you have MyChart) OR A paper copy in the mail If you have any lab test that is abnormal or we need to change your treatment, we will call you to review the results.   Testing/Procedures: NONE   Follow-Up: At [facility], you and your health needs are our priority.  As part of our continuing mission to provide you with exceptional heart care, we have created designated Provider Care Teams.  These Care Teams include your primary Cardiologist (physician) and Advanced Practice Providers (APPs -  Physician Assistants and Nurse Practitioners) who all work together to provide you with the care you need, when you need it.  We recommend signing up for the patient portal called "MyChart".  Sign up information is provided on this After Visit Summary.  MyChart is used to  connect with patients for Virtual Visits (Telemedicine).  Patients are able to view lab/test results, encounter notes, upcoming appointments, etc.  Non-urgent messages can be sent to your provider as well.   To learn more about what you can do with MyChart, go to [email or web address].    Your next appointment:   1 year(s)  Provider:   Tonny Bollman, MD        Signed, Tonny Bollman, MD  07/17/2022 10:58 AM    [facility]

## 2022-07-18 LAB — COMPREHENSIVE METABOLIC PANEL
ALT: 23 IU/L (ref 0–32)
AST: 22 IU/L (ref 0–40)
Albumin/Globulin Ratio: 1.6 (ref 1.2–2.2)
Albumin: 4.3 g/dL (ref 3.8–4.8)
Alkaline Phosphatase: 72 IU/L (ref 44–121)
BUN/Creatinine Ratio: 16 (ref 12–28)
BUN: 23 mg/dL (ref 8–27)
Bilirubin Total: 0.3 mg/dL (ref 0.0–1.2)
CO2: 17 mmol/L — ABNORMAL LOW (ref 20–29)
Calcium: 10 mg/dL (ref 8.7–10.3)
Chloride: 104 mmol/L (ref 96–106)
Creatinine, Ser: 1.46 mg/dL — ABNORMAL HIGH (ref 0.57–1.00)
Globulin, Total: 2.7 g/dL (ref 1.5–4.5)
Glucose: 92 mg/dL (ref 70–99)
Potassium: 3.9 mmol/L (ref 3.5–5.2)
Sodium: 143 mmol/L (ref 134–144)
Total Protein: 7 g/dL (ref 6.0–8.5)
eGFR: 36 mL/min/{1.73_m2} — ABNORMAL LOW (ref >59)

## 2022-07-18 LAB — HEMOGLOBIN A1C
Est. average glucose Bld gHb Est-mCnc: 143 mg/dL
Hgb A1c MFr Bld: 6.6 % — ABNORMAL HIGH (ref 4.8–5.6)

## 2022-07-18 LAB — LIPID PANEL
Chol/HDL Ratio: 2.7 ratio (ref 0.0–4.4)
Cholesterol, Total: 190 mg/dL (ref 100–199)
HDL: 71 mg/dL (ref >39)
LDL Chol Calc (NIH): 94 mg/dL (ref 0–99)
Triglycerides: 144 mg/dL (ref 0–149)
VLDL Cholesterol Cal: 25 mg/dL (ref 5–40)

## 2022-07-21 DIAGNOSIS — C50911 Malignant neoplasm of unspecified site of right female breast: Secondary | ICD-10-CM | POA: Diagnosis not present

## 2022-07-23 DIAGNOSIS — N1831 Chronic kidney disease, stage 3a: Secondary | ICD-10-CM | POA: Diagnosis not present

## 2022-07-23 DIAGNOSIS — I10 Essential (primary) hypertension: Secondary | ICD-10-CM | POA: Diagnosis not present

## 2022-07-23 DIAGNOSIS — E119 Type 2 diabetes mellitus without complications: Secondary | ICD-10-CM | POA: Diagnosis not present

## 2022-07-23 DIAGNOSIS — H9193 Unspecified hearing loss, bilateral: Secondary | ICD-10-CM | POA: Diagnosis not present

## 2022-07-23 DIAGNOSIS — E782 Mixed hyperlipidemia: Secondary | ICD-10-CM | POA: Diagnosis not present

## 2022-08-05 DIAGNOSIS — H52203 Unspecified astigmatism, bilateral: Secondary | ICD-10-CM | POA: Diagnosis not present

## 2022-08-05 DIAGNOSIS — H04213 Epiphora due to excess lacrimation, bilateral lacrimal glands: Secondary | ICD-10-CM | POA: Diagnosis not present

## 2022-08-05 DIAGNOSIS — H5203 Hypermetropia, bilateral: Secondary | ICD-10-CM | POA: Diagnosis not present

## 2022-08-05 DIAGNOSIS — H04123 Dry eye syndrome of bilateral lacrimal glands: Secondary | ICD-10-CM | POA: Diagnosis not present

## 2022-08-05 DIAGNOSIS — E119 Type 2 diabetes mellitus without complications: Secondary | ICD-10-CM | POA: Diagnosis not present

## 2022-09-28 DIAGNOSIS — N281 Cyst of kidney, acquired: Secondary | ICD-10-CM | POA: Diagnosis not present

## 2022-09-28 DIAGNOSIS — K7689 Other specified diseases of liver: Secondary | ICD-10-CM | POA: Diagnosis not present

## 2022-09-28 DIAGNOSIS — R1030 Lower abdominal pain, unspecified: Secondary | ICD-10-CM | POA: Diagnosis not present

## 2022-09-28 DIAGNOSIS — N3281 Overactive bladder: Secondary | ICD-10-CM | POA: Diagnosis not present

## 2022-09-28 DIAGNOSIS — R300 Dysuria: Secondary | ICD-10-CM | POA: Diagnosis not present

## 2022-09-28 DIAGNOSIS — R109 Unspecified abdominal pain: Secondary | ICD-10-CM | POA: Diagnosis not present

## 2022-09-28 DIAGNOSIS — R112 Nausea with vomiting, unspecified: Secondary | ICD-10-CM | POA: Diagnosis not present

## 2022-09-28 DIAGNOSIS — Z9049 Acquired absence of other specified parts of digestive tract: Secondary | ICD-10-CM | POA: Diagnosis not present

## 2022-09-28 DIAGNOSIS — K4090 Unilateral inguinal hernia, without obstruction or gangrene, not specified as recurrent: Secondary | ICD-10-CM | POA: Diagnosis not present

## 2022-09-28 DIAGNOSIS — K649 Unspecified hemorrhoids: Secondary | ICD-10-CM | POA: Diagnosis not present

## 2022-09-29 DIAGNOSIS — E782 Mixed hyperlipidemia: Secondary | ICD-10-CM | POA: Diagnosis not present

## 2022-09-29 DIAGNOSIS — N1831 Chronic kidney disease, stage 3a: Secondary | ICD-10-CM | POA: Diagnosis not present

## 2022-09-29 DIAGNOSIS — E119 Type 2 diabetes mellitus without complications: Secondary | ICD-10-CM | POA: Diagnosis not present

## 2022-09-29 DIAGNOSIS — I10 Essential (primary) hypertension: Secondary | ICD-10-CM | POA: Diagnosis not present

## 2022-09-29 DIAGNOSIS — Z0001 Encounter for general adult medical examination with abnormal findings: Secondary | ICD-10-CM | POA: Diagnosis not present

## 2022-10-22 DIAGNOSIS — K640 First degree hemorrhoids: Secondary | ICD-10-CM | POA: Diagnosis not present

## 2022-10-22 DIAGNOSIS — I10 Essential (primary) hypertension: Secondary | ICD-10-CM | POA: Diagnosis not present

## 2022-10-22 DIAGNOSIS — E119 Type 2 diabetes mellitus without complications: Secondary | ICD-10-CM | POA: Diagnosis not present

## 2022-10-22 DIAGNOSIS — N1831 Chronic kidney disease, stage 3a: Secondary | ICD-10-CM | POA: Diagnosis not present

## 2022-10-22 DIAGNOSIS — E782 Mixed hyperlipidemia: Secondary | ICD-10-CM | POA: Diagnosis not present

## 2022-10-22 DIAGNOSIS — R062 Wheezing: Secondary | ICD-10-CM | POA: Diagnosis not present

## 2022-10-27 DIAGNOSIS — H903 Sensorineural hearing loss, bilateral: Secondary | ICD-10-CM | POA: Diagnosis not present

## 2022-11-23 DIAGNOSIS — S7002XA Contusion of left hip, initial encounter: Secondary | ICD-10-CM | POA: Diagnosis not present

## 2022-11-23 DIAGNOSIS — S40022A Contusion of left upper arm, initial encounter: Secondary | ICD-10-CM | POA: Diagnosis not present

## 2022-11-23 DIAGNOSIS — M19012 Primary osteoarthritis, left shoulder: Secondary | ICD-10-CM | POA: Diagnosis not present

## 2022-11-23 DIAGNOSIS — M25522 Pain in left elbow: Secondary | ICD-10-CM | POA: Diagnosis not present

## 2022-11-23 DIAGNOSIS — Z886 Allergy status to analgesic agent status: Secondary | ICD-10-CM | POA: Diagnosis not present

## 2022-11-23 DIAGNOSIS — Z7982 Long term (current) use of aspirin: Secondary | ICD-10-CM | POA: Diagnosis not present

## 2022-11-23 DIAGNOSIS — Z853 Personal history of malignant neoplasm of breast: Secondary | ICD-10-CM | POA: Diagnosis not present

## 2022-11-23 DIAGNOSIS — I129 Hypertensive chronic kidney disease with stage 1 through stage 4 chronic kidney disease, or unspecified chronic kidney disease: Secondary | ICD-10-CM | POA: Diagnosis not present

## 2022-11-23 DIAGNOSIS — Z880 Allergy status to penicillin: Secondary | ICD-10-CM | POA: Diagnosis not present

## 2022-11-23 DIAGNOSIS — Z91040 Latex allergy status: Secondary | ICD-10-CM | POA: Diagnosis not present

## 2022-11-23 DIAGNOSIS — S79922A Unspecified injury of left thigh, initial encounter: Secondary | ICD-10-CM | POA: Diagnosis not present

## 2022-11-23 DIAGNOSIS — N189 Chronic kidney disease, unspecified: Secondary | ICD-10-CM | POA: Diagnosis not present

## 2022-11-23 DIAGNOSIS — S40012A Contusion of left shoulder, initial encounter: Secondary | ICD-10-CM | POA: Diagnosis not present

## 2022-11-23 DIAGNOSIS — R296 Repeated falls: Secondary | ICD-10-CM | POA: Diagnosis not present

## 2022-11-23 DIAGNOSIS — E1122 Type 2 diabetes mellitus with diabetic chronic kidney disease: Secondary | ICD-10-CM | POA: Diagnosis not present

## 2023-02-07 DIAGNOSIS — N189 Chronic kidney disease, unspecified: Secondary | ICD-10-CM | POA: Diagnosis not present

## 2023-02-07 DIAGNOSIS — M5136 Other intervertebral disc degeneration, lumbar region with discogenic back pain only: Secondary | ICD-10-CM | POA: Diagnosis not present

## 2023-02-07 DIAGNOSIS — M4316 Spondylolisthesis, lumbar region: Secondary | ICD-10-CM | POA: Diagnosis not present

## 2023-02-07 DIAGNOSIS — M47816 Spondylosis without myelopathy or radiculopathy, lumbar region: Secondary | ICD-10-CM | POA: Diagnosis not present

## 2023-02-07 DIAGNOSIS — Z880 Allergy status to penicillin: Secondary | ICD-10-CM | POA: Diagnosis not present

## 2023-02-07 DIAGNOSIS — E1122 Type 2 diabetes mellitus with diabetic chronic kidney disease: Secondary | ICD-10-CM | POA: Diagnosis not present

## 2023-02-07 DIAGNOSIS — Z79899 Other long term (current) drug therapy: Secondary | ICD-10-CM | POA: Diagnosis not present

## 2023-02-07 DIAGNOSIS — M2578 Osteophyte, vertebrae: Secondary | ICD-10-CM | POA: Diagnosis not present

## 2023-02-07 DIAGNOSIS — M25552 Pain in left hip: Secondary | ICD-10-CM | POA: Diagnosis not present

## 2023-02-07 DIAGNOSIS — Z91040 Latex allergy status: Secondary | ICD-10-CM | POA: Diagnosis not present

## 2023-02-07 DIAGNOSIS — I129 Hypertensive chronic kidney disease with stage 1 through stage 4 chronic kidney disease, or unspecified chronic kidney disease: Secondary | ICD-10-CM | POA: Diagnosis not present

## 2023-02-07 DIAGNOSIS — M51362 Other intervertebral disc degeneration, lumbar region with discogenic back pain and lower extremity pain: Secondary | ICD-10-CM | POA: Diagnosis not present

## 2023-02-07 DIAGNOSIS — Z7982 Long term (current) use of aspirin: Secondary | ICD-10-CM | POA: Diagnosis not present

## 2023-02-07 DIAGNOSIS — M51369 Other intervertebral disc degeneration, lumbar region without mention of lumbar back pain or lower extremity pain: Secondary | ICD-10-CM | POA: Diagnosis not present

## 2023-02-07 DIAGNOSIS — M1612 Unilateral primary osteoarthritis, left hip: Secondary | ICD-10-CM | POA: Diagnosis not present

## 2023-02-11 DIAGNOSIS — E782 Mixed hyperlipidemia: Secondary | ICD-10-CM | POA: Diagnosis not present

## 2023-02-11 DIAGNOSIS — I10 Essential (primary) hypertension: Secondary | ICD-10-CM | POA: Diagnosis not present

## 2023-02-11 DIAGNOSIS — M1611 Unilateral primary osteoarthritis, right hip: Secondary | ICD-10-CM | POA: Diagnosis not present

## 2023-02-11 DIAGNOSIS — N1831 Chronic kidney disease, stage 3a: Secondary | ICD-10-CM | POA: Diagnosis not present

## 2023-02-11 DIAGNOSIS — E119 Type 2 diabetes mellitus without complications: Secondary | ICD-10-CM | POA: Diagnosis not present

## 2023-03-12 DIAGNOSIS — M25561 Pain in right knee: Secondary | ICD-10-CM | POA: Diagnosis not present

## 2023-03-12 DIAGNOSIS — M25552 Pain in left hip: Secondary | ICD-10-CM | POA: Diagnosis not present

## 2023-03-18 DIAGNOSIS — N1831 Chronic kidney disease, stage 3a: Secondary | ICD-10-CM | POA: Diagnosis not present

## 2023-03-18 DIAGNOSIS — M1611 Unilateral primary osteoarthritis, right hip: Secondary | ICD-10-CM | POA: Diagnosis not present

## 2023-03-18 DIAGNOSIS — I10 Essential (primary) hypertension: Secondary | ICD-10-CM | POA: Diagnosis not present

## 2023-03-18 DIAGNOSIS — E782 Mixed hyperlipidemia: Secondary | ICD-10-CM | POA: Diagnosis not present

## 2023-03-18 DIAGNOSIS — E119 Type 2 diabetes mellitus without complications: Secondary | ICD-10-CM | POA: Diagnosis not present

## 2023-04-06 DIAGNOSIS — M1612 Unilateral primary osteoarthritis, left hip: Secondary | ICD-10-CM | POA: Diagnosis not present

## 2023-04-06 DIAGNOSIS — M7062 Trochanteric bursitis, left hip: Secondary | ICD-10-CM | POA: Diagnosis not present

## 2023-04-17 DIAGNOSIS — E119 Type 2 diabetes mellitus without complications: Secondary | ICD-10-CM | POA: Diagnosis not present

## 2023-04-17 DIAGNOSIS — E782 Mixed hyperlipidemia: Secondary | ICD-10-CM | POA: Diagnosis not present

## 2023-04-17 DIAGNOSIS — I10 Essential (primary) hypertension: Secondary | ICD-10-CM | POA: Diagnosis not present

## 2023-04-17 DIAGNOSIS — M1611 Unilateral primary osteoarthritis, right hip: Secondary | ICD-10-CM | POA: Diagnosis not present

## 2023-04-17 DIAGNOSIS — N1831 Chronic kidney disease, stage 3a: Secondary | ICD-10-CM | POA: Diagnosis not present

## 2023-04-30 DIAGNOSIS — M7062 Trochanteric bursitis, left hip: Secondary | ICD-10-CM | POA: Diagnosis not present

## 2023-06-02 DIAGNOSIS — Z1231 Encounter for screening mammogram for malignant neoplasm of breast: Secondary | ICD-10-CM | POA: Diagnosis not present

## 2023-07-03 DIAGNOSIS — M79631 Pain in right forearm: Secondary | ICD-10-CM | POA: Diagnosis not present

## 2023-07-03 DIAGNOSIS — Z043 Encounter for examination and observation following other accident: Secondary | ICD-10-CM | POA: Diagnosis not present

## 2023-07-03 DIAGNOSIS — M25511 Pain in right shoulder: Secondary | ICD-10-CM | POA: Diagnosis not present

## 2023-07-03 DIAGNOSIS — M25561 Pain in right knee: Secondary | ICD-10-CM | POA: Diagnosis not present

## 2023-07-03 DIAGNOSIS — M25512 Pain in left shoulder: Secondary | ICD-10-CM | POA: Diagnosis not present

## 2023-07-03 DIAGNOSIS — M25562 Pain in left knee: Secondary | ICD-10-CM | POA: Diagnosis not present

## 2023-07-03 DIAGNOSIS — M25532 Pain in left wrist: Secondary | ICD-10-CM | POA: Diagnosis not present

## 2023-07-03 DIAGNOSIS — I10 Essential (primary) hypertension: Secondary | ICD-10-CM | POA: Diagnosis not present

## 2023-07-03 DIAGNOSIS — E119 Type 2 diabetes mellitus without complications: Secondary | ICD-10-CM | POA: Diagnosis not present

## 2023-07-03 DIAGNOSIS — M25531 Pain in right wrist: Secondary | ICD-10-CM | POA: Diagnosis not present

## 2023-07-03 DIAGNOSIS — I2510 Atherosclerotic heart disease of native coronary artery without angina pectoris: Secondary | ICD-10-CM | POA: Diagnosis not present

## 2023-07-03 DIAGNOSIS — Z853 Personal history of malignant neoplasm of breast: Secondary | ICD-10-CM | POA: Diagnosis not present

## 2023-07-04 DIAGNOSIS — M25531 Pain in right wrist: Secondary | ICD-10-CM | POA: Diagnosis not present

## 2023-07-04 DIAGNOSIS — M25562 Pain in left knee: Secondary | ICD-10-CM | POA: Diagnosis not present

## 2023-07-04 DIAGNOSIS — M25561 Pain in right knee: Secondary | ICD-10-CM | POA: Diagnosis not present

## 2023-07-04 DIAGNOSIS — M25532 Pain in left wrist: Secondary | ICD-10-CM | POA: Diagnosis not present

## 2023-07-04 DIAGNOSIS — M79631 Pain in right forearm: Secondary | ICD-10-CM | POA: Diagnosis not present

## 2023-07-10 DIAGNOSIS — D631 Anemia in chronic kidney disease: Secondary | ICD-10-CM | POA: Diagnosis not present

## 2023-07-10 DIAGNOSIS — N1832 Chronic kidney disease, stage 3b: Secondary | ICD-10-CM | POA: Diagnosis not present

## 2023-07-10 DIAGNOSIS — I129 Hypertensive chronic kidney disease with stage 1 through stage 4 chronic kidney disease, or unspecified chronic kidney disease: Secondary | ICD-10-CM | POA: Diagnosis not present

## 2023-07-10 DIAGNOSIS — E559 Vitamin D deficiency, unspecified: Secondary | ICD-10-CM | POA: Diagnosis not present

## 2023-07-17 DIAGNOSIS — M1611 Unilateral primary osteoarthritis, right hip: Secondary | ICD-10-CM | POA: Diagnosis not present

## 2023-07-17 DIAGNOSIS — E119 Type 2 diabetes mellitus without complications: Secondary | ICD-10-CM | POA: Diagnosis not present

## 2023-07-17 DIAGNOSIS — M25511 Pain in right shoulder: Secondary | ICD-10-CM | POA: Diagnosis not present

## 2023-07-17 DIAGNOSIS — K5900 Constipation, unspecified: Secondary | ICD-10-CM | POA: Diagnosis not present

## 2023-07-17 DIAGNOSIS — I10 Essential (primary) hypertension: Secondary | ICD-10-CM | POA: Diagnosis not present

## 2023-07-17 DIAGNOSIS — E782 Mixed hyperlipidemia: Secondary | ICD-10-CM | POA: Diagnosis not present

## 2023-07-17 DIAGNOSIS — N1831 Chronic kidney disease, stage 3a: Secondary | ICD-10-CM | POA: Diagnosis not present

## 2023-07-27 ENCOUNTER — Encounter: Payer: Self-pay | Admitting: Cardiovascular Disease

## 2023-07-27 ENCOUNTER — Ambulatory Visit: Payer: Medicare HMO | Attending: Cardiovascular Disease | Admitting: Cardiovascular Disease

## 2023-07-27 VITALS — BP 158/88 | HR 82 | Ht 61.0 in | Wt 153.0 lb

## 2023-07-27 DIAGNOSIS — E782 Mixed hyperlipidemia: Secondary | ICD-10-CM

## 2023-07-27 DIAGNOSIS — I2510 Atherosclerotic heart disease of native coronary artery without angina pectoris: Secondary | ICD-10-CM

## 2023-07-27 DIAGNOSIS — I10 Essential (primary) hypertension: Secondary | ICD-10-CM | POA: Diagnosis not present

## 2023-07-27 DIAGNOSIS — E119 Type 2 diabetes mellitus without complications: Secondary | ICD-10-CM

## 2023-07-27 NOTE — Assessment & Plan Note (Signed)
 Treated with atorvastatin 40 mg daily.  Remote coronary stenting over 20 years ago.  Goal LDL less than 70.

## 2023-07-27 NOTE — Assessment & Plan Note (Signed)
 The patient has no anginal symptoms.  She remains on aspirin for antiplatelet therapy.  She is treated with a high intensity statin drug.

## 2023-07-27 NOTE — Patient Instructions (Signed)
 Follow-Up: At [facility], you and your health needs are our priority.  As part of our continuing mission to provide you with exceptional heart care, our providers are all part of one team.  This team includes your primary Cardiologist (physician) and Advanced Practice Providers or APPs (Physician Assistants and Nurse Practitioners) who all work together to provide you with the care you need, when you need it.  Your next appointment:   1 year(s)  Provider:   [name], MD

## 2023-07-27 NOTE — Assessment & Plan Note (Signed)
 Treated with oral hypoglycemic agents.  Appropriately on an ACE inhibitor.  Followed by her primary care physician.  Last hemoglobin A1c is 6.6.

## 2023-07-27 NOTE — Progress Notes (Signed)
 Cardiology Office Note:    Date:  07/27/2023   ID:  Evelyn Morrison, DOB January 11, 1941, MRN [id number]  PCP:  Tretha Fu, MD   [name] Cardiologist:  [name], MD     Referring MD: Tretha Fu, MD   Chief Complaint  Patient presents with   Coronary Artery Disease    History of Present Illness:    Evelyn Morrison is a 83 y.o. female with a hx of coronary artery disease, status post remote MI.  She underwent PCI at the time of her infarct in 2004, treated at another health system.  She has had multiple stress tests over the years, most recently in 2019 when a Lexiscan  Myoview  showed no ischemia or infarction with normal LVEF.   The patient is here alone today.  Reports that she has had some falls lately but does not feel like she has been lightheaded or had any particular problem with her balance.  She states that she just does too much.  She has been quite busy with her family.  They do a lot of traveling and she has a grandson who play sports and she goes to a lot of ball games.  She denies chest pain, chest pressure, or shortness of breath.  States that her blood pressure is usually pretty good at home and is only elevated when she is stressed.  Otherwise, home readings have been in the 120s over 70s.  She is followed by nephrology as well.  She reports that she just had labs done about 1 month ago.  Current Medications: Current Meds  Medication Sig   Accu-Chek FastClix Lancets MISC 1 each by Other route 3 (three) times daily.   ACCU-CHEK GUIDE test strip 1 each by Other route as needed.   acetaminophen  (TYLENOL ) 500 MG tablet Take 500-1,000 mg by mouth every 6 (six) hours as needed for moderate pain.    albuterol  (PROVENTIL  HFA;VENTOLIN  HFA) 108 (90 Base) MCG/ACT inhaler Inhale 1-2 puffs into the lungs every 4 (four) hours as needed for wheezing or shortness of breath.   amLODipine (NORVASC) 10 MG tablet Take 1 tablet by mouth daily.    aspirin 81 MG EC tablet Take 81 mg by mouth every morning.    atorvastatin (LIPITOR) 40 MG tablet Take 40 mg by mouth daily.   Biotin 82956 MCG TABS Take by mouth.   Blood Glucose Monitoring Suppl (ACCU-CHEK GUIDE ME) w/Device KIT see administration instructions.   Cholecalciferol (D3 HIGH POTENCY) 50 MCG (2000 UT) CAPS Take 2,000 Units by mouth daily.    diclofenac Sodium (VOLTAREN) 1 % GEL as directed applied topically 4 times a day   ferrous sulfate 325 (65 FE) MG tablet Take 325 mg by mouth daily with breakfast.   gabapentin (NEURONTIN) 300 MG capsule Take 300 mg by mouth 2 (two) times daily.   glipiZIDE  (GLUCOTROL  XL) 2.5 MG 24 hr tablet Take 2.5 mg by mouth daily.   ibuprofen (ADVIL) 800 MG tablet Take 800 mg by mouth every 8 (eight) hours as needed. Per patient alternating between Advil and Tylenol    lisinopril-hydrochlorothiazide (PRINZIDE,ZESTORETIC) 20-12.5 MG tablet Take 1 tablet by mouth daily.   Multiple Vitamin (MULTIVITAMIN) capsule Take 2 capsules by mouth daily.   MYRBETRIQ 25 MG TB24 tablet Take 25 mg by mouth daily.   nitroGLYCERIN (NITROSTAT) 0.4 MG SL tablet Place 0.4 mg under the tongue every 5 (five) minutes as needed for chest pain (3 doses max).    predniSONE (DELTASONE) 20 MG tablet  Take 20 mg by mouth.   TURMERIC PO Take 1 capsule by mouth daily as needed (arthritis pain).   [DISCONTINUED] hydrOXYzine (VISTARIL) 25 MG capsule Take 25 mg by mouth at bedtime.   [DISCONTINUED] lovastatin  (MEVACOR ) 10 MG tablet Take 10 mg by mouth at bedtime.   Current Facility-Administered Medications for the 07/27/23 encounter (Office Visit) with [name], MD  Medication   0.9 %  sodium chloride  infusion     Allergies:   Penicillins, Ibuprofen, Lactose intolerance (gi), Milk-related compounds, Nyquil multi-symptom [pseudoeph-doxylamine-dm-apap], Penicillin g, and Latex   ROS:   Please see the history of present illness.    All other systems reviewed and are  negative.  EKGs/Labs/Other Studies Reviewed:    The following studies were reviewed today: Cardiac Studies & Procedures   ______________________________________________________________________________________________   STRESS TESTS  MYOCARDIAL PERFUSION IMAGING 05/29/2017  Narrative  Nuclear stress EF: 74%.  There was no ST segment deviation noted during stress.  The study is normal.  This is a low risk study.  The left ventricular ejection fraction is hyperdynamic (>65%).  Normal stress nuclear study with no ischemia or infarction; EF 74 with normal wall motion.   ECHOCARDIOGRAM  ECHOCARDIOGRAM COMPLETE 05/29/2017  Narrative *[facility]* [address] [place], [place] [address] [phone]  ------------------------------------------------------------------- Transthoracic Echocardiography  Patient:    Morrison, Evelyn MR #:       [id number] Study Date: 05/29/2017 Gender:     F Age:        76 Height:     154.9 cm Weight:     68.6 kg BSA:        1.74 m^2 Pt. Status: Room:  SONOGRAPHER  Denece Finger, Will Normajean Becket, Scott Majel Scott T ATTENDING    Peder Bourdon, M.D. PERFORMING   [facility], Outpatient  cc:  ------------------------------------------------------------------- LV EF: 60% -   65%  ------------------------------------------------------------------- Indications:      (R06.02).  ------------------------------------------------------------------- History:   PMH:  Acquired from the patient and from the patient&'s chart.  Chest pain.  Dyspnea.  Coronary artery disease.  Risk factors:  Hypertension. Diabetes mellitus. Dyslipidemia.  ------------------------------------------------------------------- Study Conclusions  - Left ventricle: The cavity size was normal. Wall thickness was increased in a pattern of mild LVH. Systolic function was normal. The estimated ejection fraction was in the range of 60% to 65%. Wall  motion was normal; there were no regional wall motion abnormalities. Doppler parameters are consistent with abnormal left ventricular relaxation (grade 1 diastolic dysfunction). - Aortic valve: There was no stenosis. - Mitral valve: There was no significant regurgitation. - Right ventricle: The cavity size was normal. Systolic function was normal. - Tricuspid valve: Peak RV-RA gradient (S): 31 mm Hg. - Pulmonary arteries: PA peak pressure: 34 mm Hg (S). - Inferior vena cava: The vessel was normal in size. The respirophasic diameter changes were in the normal range (>= 50%), consistent with normal central venous pressure.  Impressions:  - Normal LV size with mild LV hypertrophy. EF 60-65%. Normal RV size and systolic function. No significant valvular abnormalities.  ------------------------------------------------------------------- Study data:  No prior study was available for comparison.  Study status:  Routine.  Procedure:  The patient reported no pain pre or post test. Transthoracic echocardiography for left ventricular function evaluation. Image quality was adequate.  Study completion: There were no complications.          Transthoracic echocardiography.  M-mode, complete 2D, spectral Doppler, and color Doppler.  Birthdate:  Patient birthdate: September 11, 1940.  Age:  Patient is 83 yr old.  Sex:  Gender: female.    BMI: 28.6 kg/m^2.  Blood pressure:     122/70  Patient status:  Outpatient.  Study date: Study date: 05/29/2017. Study time: 07:50 AM.  Location:  [facility]  -------------------------------------------------------------------  ------------------------------------------------------------------- Left ventricle:  The cavity size was normal. Wall thickness was increased in a pattern of mild LVH. Systolic function was normal. The estimated ejection fraction was in the range of 60% to 65%. Wall motion was normal; there were no regional wall motion abnormalities.  Doppler parameters are consistent with abnormal left ventricular relaxation (grade 1 diastolic dysfunction).  ------------------------------------------------------------------- Aortic valve:   Trileaflet; mildly calcified leaflets.  Doppler: There was no stenosis.   There was no regurgitation.  ------------------------------------------------------------------- Aorta:  Aortic root: The aortic root was normal in size. Ascending aorta: The ascending aorta was normal in size.  ------------------------------------------------------------------- Mitral valve:   Normal thickness leaflets .  Doppler:   There was no evidence for stenosis.   There was no significant regurgitation.  ------------------------------------------------------------------- Left atrium:  The atrium was normal in size.  ------------------------------------------------------------------- Right ventricle:  The cavity size was normal. Systolic function was normal.  ------------------------------------------------------------------- Pulmonic valve:    Structurally normal valve.   Cusp separation was normal.  Doppler:  Transvalvular velocity was within the normal range. There was trivial regurgitation.  ------------------------------------------------------------------- Tricuspid valve:   Doppler:  There was mild regurgitation.  ------------------------------------------------------------------- Right atrium:  The atrium was normal in size.  ------------------------------------------------------------------- Pericardium:  There was no pericardial effusion.  ------------------------------------------------------------------- Systemic veins: Inferior vena cava: The vessel was normal in size. The respirophasic diameter changes were in the normal range (>= 50%), consistent with normal central venous pressure.  ------------------------------------------------------------------- Measurements  Left ventricle                            Value        Reference LV ID, ED, PLAX chordal          (L)     39.9  mm     43 - 52 LV ID, ES, PLAX chordal                  28.6  mm     23 - 38 LV fx shortening, PLAX chordal   (L)     28    %      >=29 LV PW thickness, ED                      8.02  mm     --------- IVS/LV PW ratio, ED              (H)     1.55         <=1.3 Stroke volume, 2D                        50    ml     --------- Stroke volume/bsa, 2D                    29    ml/m^2 --------- LV ejection fraction, 1-p A4C            56    %      --------- LV end-diastolic volume, 2-p  48    ml     --------- LV end-systolic volume, 2-p              22    ml     --------- LV ejection fraction, 2-p                54    %      --------- Stroke volume, 2-p                       26    ml     --------- LV end-diastolic volume/bsa, 2-p         28    ml/m^2 --------- LV end-systolic volume/bsa, 2-p          13    ml/m^2 --------- Stroke volume/bsa, 2-p                   14.9  ml/m^2 --------- LV e&', lateral                           8.97  cm/s   --------- LV E/e&', lateral                         7.27         --------- LV e&', medial                            6.73  cm/s   --------- LV E/e&', medial                          9.69         --------- LV e&', average                           7.85  cm/s   --------- LV E/e&', average                         8.31         ---------  Ventricular septum                       Value        Reference IVS thickness, ED                        12.4  mm     ---------  LVOT                                     Value        Reference LVOT ID, S                               17    mm     --------- LVOT area                                2.27  cm^2   --------- LVOT ID  17    mm     --------- LVOT peak velocity, S                    101   cm/s   --------- LVOT mean velocity, S                    64    cm/s   --------- LVOT VTI, S                               22    cm     --------- LVOT peak gradient, S                    4     mm Hg  --------- Stroke volume (SV), LVOT DP              49.9  ml     --------- Stroke index (SV/bsa), LVOT DP           28.7  ml/m^2 ---------  Aorta                                    Value        Reference Aortic root ID, ED                       30    mm     --------- Ascending aorta ID, A-P, S               32    mm     ---------  Left atrium                              Value        Reference LA ID, A-P, ES                           31    mm     --------- LA ID/bsa, A-P                           1.78  cm/m^2 <=2.2 LA volume, S                             29    ml     --------- LA volume/bsa, S                         16.7  ml/m^2 --------- LA volume, ES, 1-p A4C                   25    ml     --------- LA volume/bsa, ES, 1-p A4C               14.4  ml/m^2 --------- LA volume, ES, 1-p A2C                   31    ml     --------- LA volume/bsa, ES, 1-p A2C               17.8  ml/m^2 ---------  Mitral valve                             Value        Reference Mitral E-wave peak velocity              65.2  cm/s   --------- Mitral A-wave peak velocity              91.8  cm/s   --------- Mitral deceleration time         (H)     257   ms     150 - 230 Mitral E/A ratio, peak                   0.7          ---------  Pulmonary arteries                       Value        Reference PA pressure, S, DP               (H)     34    mm Hg  <=30  Tricuspid valve                          Value        Reference Tricuspid regurg peak velocity           278   cm/s   --------- Tricuspid peak RV-RA gradient            31    mm Hg  ---------  Systemic veins                           Value        Reference Estimated CVP                            3     mm Hg  ---------  Right ventricle                          Value        Reference RV s&', lateral, S                        14.8  cm/s   ---------  Legend: (L)  and   (H)  mark values outside specified reference range.  ------------------------------------------------------------------- Prepared and Electronically Authenticated by  Peder Bourdon, M.D. 2019-03-01T16:52:18          ______________________________________________________________________________________________      EKG:   EKG Interpretation Date/Time:  Monday July 27 2023 15:25:12 EDT Ventricular Rate:  82 PR Interval:  144 QRS Duration:  78 QT Interval:  356 QTC Calculation: 415 R Axis:   -3  Text Interpretation: Normal sinus rhythm Minimal voltage criteria for LVH, may be normal variant ( R in aVL ) When compared with ECG of 15-May-2022 17:11, PREVIOUS ECG IS PRESENT No significant change was found Confirmed by [name] [phone]) on 07/27/2023 3:32:19 PM    Recent Labs: No results found for requested labs within last 365 days.  Recent Lipid Panel    Component Value Date/Time   CHOL 190 07/17/2022 1100   TRIG 144 07/17/2022 1100  HDL 71 07/17/2022 1100   CHOLHDL 2.7 07/17/2022 1100   CHOLHDL 2.3 03/10/2016 1008   VLDL 25 03/10/2016 1008   LDLCALC 94 07/17/2022 1100     Risk Assessment/Calculations:      HYPERTENSION CONTROL Vitals:   07/27/23 1521 07/27/23 1553  BP: (!) 130/100 (!) 158/88    The patient's blood pressure is elevated above target today.  In order to address the patient's elevated BP: Blood pressure will be monitored at home to determine if medication changes need to be made.            Physical Exam:    VS:  BP (!) 158/88   Pulse 82   Ht 5\' 1"  (1.549 m)   Wt 153 lb (69.4 kg)   SpO2 97%   BMI 28.91 kg/m     Wt Readings from Last 3 Encounters:  07/27/23 153 lb (69.4 kg)  07/17/22 149 lb 3.2 oz (67.7 kg)  05/15/22 152 lb (68.9 kg)     GEN:  Well nourished, well developed in no acute distress HEENT: Normal NECK: No JVD; No carotid bruits LYMPHATICS: No lymphadenopathy CARDIAC: RRR, no murmurs, rubs, gallops RESPIRATORY:   Clear to auscultation without rales, wheezing or rhonchi  ABDOMEN: Soft, non-tender, non-distended MUSCULOSKELETAL:  No edema; No deformity  SKIN: Warm and dry NEUROLOGIC:  Alert and oriented x 3 PSYCHIATRIC:  Normal affect   Assessment & Plan Essential hypertension Home readings within range, today's office reading is elevated.  With her fall risk and report of home readings in range, recommend continuation of amlodipine, hydrochlorothiazide, and lisinopril.  No changes are made today. Coronary artery disease involving native coronary artery of native heart without angina pectoris The patient has no anginal symptoms.  She remains on aspirin for antiplatelet therapy.  She is treated with a high intensity statin drug. Mixed hyperlipidemia Treated with atorvastatin 40 mg daily.  Remote coronary stenting over 20 years ago.  Goal LDL less than 70. Type 2 diabetes mellitus without complication, without long-term current use of insulin (HCC) Treated with oral hypoglycemic agents.  Appropriately on an ACE inhibitor.  Followed by her primary care physician.  Last hemoglobin A1c is 6.6.      Medication Adjustments/Labs and Tests Ordered: Current medicines are reviewed at length with the patient today.  Concerns regarding medicines are outlined above.  Orders Placed This Encounter  Procedures   EKG 12-Lead   No orders of the defined types were placed in this encounter.   Patient Instructions  Follow-Up: At [facility], you and your health needs are our priority.  As part of our continuing mission to provide you with exceptional heart care, our providers are all part of one team.  This team includes your primary Cardiologist (physician) and Advanced Practice Providers or APPs (Physician Assistants and Nurse Practitioners) who all work together to provide you with the care you need, when you need it.  Your next appointment:   1 year(s)  Provider:   [name], MD        Signed, [name], MD  07/27/2023 3:54 PM    [facility]

## 2023-07-27 NOTE — Assessment & Plan Note (Signed)
 Home readings within range, today's office reading is elevated.  With her fall risk and report of home readings in range, recommend continuation of amlodipine, hydrochlorothiazide, and lisinopril.  No changes are made today.

## 2023-08-04 DIAGNOSIS — M25561 Pain in right knee: Secondary | ICD-10-CM | POA: Diagnosis not present

## 2023-08-04 DIAGNOSIS — M7062 Trochanteric bursitis, left hip: Secondary | ICD-10-CM | POA: Diagnosis not present

## 2023-08-04 DIAGNOSIS — M25562 Pain in left knee: Secondary | ICD-10-CM | POA: Diagnosis not present

## 2023-08-06 ENCOUNTER — Ambulatory Visit (HOSPITAL_COMMUNITY)
Admission: RE | Admit: 2023-08-06 | Discharge: 2023-08-06 | Disposition: A | Discharge status: Home / Self Care | Source: Ambulatory Visit | Attending: Vascular Surgery | Admitting: Vascular Surgery

## 2023-08-06 ENCOUNTER — Other Ambulatory Visit (HOSPITAL_COMMUNITY): Payer: Self-pay | Admitting: Orthopedic Surgery

## 2023-08-06 DIAGNOSIS — R609 Edema, unspecified: Secondary | ICD-10-CM | POA: Diagnosis not present

## 2023-08-10 DIAGNOSIS — M7062 Trochanteric bursitis, left hip: Secondary | ICD-10-CM | POA: Diagnosis not present

## 2023-08-12 DIAGNOSIS — E119 Type 2 diabetes mellitus without complications: Secondary | ICD-10-CM | POA: Diagnosis not present

## 2023-08-12 DIAGNOSIS — H52203 Unspecified astigmatism, bilateral: Secondary | ICD-10-CM | POA: Diagnosis not present

## 2023-08-12 DIAGNOSIS — H04123 Dry eye syndrome of bilateral lacrimal glands: Secondary | ICD-10-CM | POA: Diagnosis not present

## 2023-08-12 DIAGNOSIS — H5203 Hypermetropia, bilateral: Secondary | ICD-10-CM | POA: Diagnosis not present

## 2023-08-12 DIAGNOSIS — H35372 Puckering of macula, left eye: Secondary | ICD-10-CM | POA: Diagnosis not present

## 2023-08-21 ENCOUNTER — Telehealth: Payer: Self-pay | Admitting: Cardiovascular Disease

## 2023-08-21 NOTE — Telephone Encounter (Signed)
 Incoming call to triage answered as pt is hospitalized in [place] and physician there states troponins are high and wanted to verify if pt has current diagnosis of CKD or A-fib. No documented A-fib on file, no specific diagnosis for CKD, but based on last labs with GFR of 28 and Cr near 2, she must. Pt has nephrologist and they will reach out to them. No further questions.

## 2023-08-21 NOTE — Telephone Encounter (Signed)
 Daughter Candice Chalet) states patient is in hospital in [place] and wants to confirm patient has an afib diagnosis.

## 2023-08-27 ENCOUNTER — Telehealth: Payer: Self-pay

## 2023-08-27 NOTE — Progress Notes (Signed)
   08/27/2023  Patient ID: Evelyn Morrison, female   DOB: 02-Aug-1940, 83 y.o.   MRN: [id number]  Contacted patient regarding medication adherence from a quality report for [facility]. The patient failed MAD in 2024.    Left patient a voicemail to return my call at their convenience.  Livia Riffle, PharmD Clinical Pharmacist  [phone]

## 2023-08-31 ENCOUNTER — Telehealth: Payer: Self-pay | Admitting: Cardiovascular Disease

## 2023-08-31 NOTE — Telephone Encounter (Signed)
 Pts daughter called tp report that the pt had a syncopal episode while in [place] and was admitted there from 08/21/23- 08/26/23.... she said they r/o MI via heart cath... [place] and has felt well since.... they advised he that she was dehydrated and she has been pushing fluids evener since... she has not had any palpitations, no chest pain.   Her daughter has her records and plans to bring them to her 09/14/23 OV and if anything changes prior she will call our office... pt will monitor her BP and bring log with her to the appt.

## 2023-08-31 NOTE — Telephone Encounter (Signed)
 Daughter Candice Chalet) stated patient was hospitalized in [facility] ED and they thinks patient may need a medication change.  Daughter stated she will have hospital visit information faxed to Dr. Katheryne Pane office prior to visit on 6/16.

## 2023-09-04 NOTE — Telephone Encounter (Signed)
Thx for letting me know

## 2023-09-04 NOTE — Progress Notes (Signed)
   09/04/2023  Patient ID: Evelyn Morrison, female   DOB: March 25, 1941, 83 y.o.   MRN: [id number]  Contacted patient regarding medication adherence from a quality report for [facility]. The patient failed MAD in 2024.    Left patient a voicemail to return my call at their convenience.  Livia Riffle, PharmD Clinical Pharmacist  [phone]

## 2023-09-10 NOTE — Progress Notes (Signed)
 Cardiology Office Note:    Date:  09/14/2023   ID:  Evelyn Morrison, DOB 06/01/1940, MRN [id number]  PCP:  Tretha Fu, MD   [facility] Providers Cardiologist:  [name], MD { Referring MD: Tretha Fu, MD   Chief Complaint  Patient presents with   Follow-up    Syncope    History of Present Illness:    Evelyn Morrison is a 83 y.o. female with a hx of esophageal stricture, DM2, HTN, CAD s/p remote MI with PCI in 2004. Multiple subsequent stress tests negative for ischemia (last nuc 2019).   She recently traveled to [place] and unfortunately had a syncopal spell and was admitted 08/21/2023 - 08/26/2023 and apparently had a heart catheterization without PCI.   She presents for cardiology follow up. She recounts that she passed out in an [place] in [place]. She was watching the scenery and she apparently LOC. Prior to that, she felt lightheaded descending stairs to get to the uber. She reports chest pain in the uber and she took NTG x 1. She also took lisinopril-hydrochlorothiazide and had not eaten breakfast that morning. She was admitted and underwent heart catheterization after troponin was elevated to 103 (unclear normal range). LCH with patent stent, no intervention.  She was discharged on lisinopril-hydrochlorothiazide (new) but she ran out of that. She also states she is not taking amlodipine.   She is currently not taking anything for her BP. She takes NTG for chest pain once very 2 months.    Past Medical History:  Diagnosis Date   Anemia    Anxiety    Arthritis    CAD (coronary artery disease)    Myoview  12/17: EF 66, apical and apical lateral defect - likely attenuation artifact, no ischemia; Low Risk // Myoview  3/19:  EF 74, no ischemia or scar; low risk    Cataract, diabetic (HCC)    Chest pain    Chronic kidney disease    stage 3   Constipation    Contusion of arm    Diabetes mellitus    Diabetic retinopathy    DM (diabetes mellitus)  (HCC)    Dyspnea    SOb on exertion   Esophageal stricture    Family history of ovarian cancer    Fatigue    Glaucoma    Heart burn    Hiatal hernia    History of echocardiogram    Echo 3/19:  Mild LVH, EF 60-65, no RWMA, Gr 1 DD, PASP 34   HLD (hyperlipidemia)    Hypertension    Joint stiffness of hand    Myocardial infarction (HCC)    2004, stent   Urinary incontinence    Vitamin D deficiency     Past Surgical History:  Procedure Laterality Date   adenosine cardillite  05/16/05   negative signif. myocardial ischemic   BREAST LUMPECTOMY WITH RADIOACTIVE SEED LOCALIZATION Right 07/29/2019   Procedure: RIGHT BREAST LUMPECTOMY WITH RADIOACTIVE SEED LOCALIZATION;  Surgeon: Lockie Rima, MD;  Location: [facility];  Service: General;  Laterality: Right;   CARDIAC CATHETERIZATION  10/04   s/p stent LV 45%   CATARACT EXTRACTION     bilateral   CESAREAN SECTION  84, 86   ectopic pregnancy   CHOLECYSTECTOMY  1998   CORONARY ANGIOPLASTY WITH STENT PLACEMENT  10/04   PARTIAL HYSTERECTOMY     RE-EXCISION OF BREAST LUMPECTOMY Right 08/31/2019   Procedure: RE-EXCISION OF RIGHT BREAST LUMPECTOMY;  Surgeon: Lockie Rima, MD;  Location: [facility];  Service: General;  Laterality: Right;    Current Medications: Current Meds  Medication Sig   Accu-Chek FastClix Lancets MISC 1 each by Other route 3 (three) times daily.   ACCU-CHEK GUIDE test strip 1 each by Other route as needed.   acetaminophen  (TYLENOL ) 500 MG tablet Take 500-1,000 mg by mouth every 6 (six) hours as needed for moderate pain.    albuterol  (PROVENTIL  HFA;VENTOLIN  HFA) 108 (90 Base) MCG/ACT inhaler Inhale 1-2 puffs into the lungs every 4 (four) hours as needed for wheezing or shortness of breath.   aspirin 81 MG EC tablet Take 81 mg by mouth every morning.  (Patient taking differently: Take 75 mg by mouth daily.)   atorvastatin (LIPITOR) 40 MG tablet Take 40 mg by mouth daily. (Patient taking differently: Take 40 mg  by mouth 2 (two) times daily.)   Biotin 09811 MCG TABS Take by mouth.   Bisoprolol Fumarate 2.5 MG TABS Take 2.5 mg by mouth daily.   Blood Glucose Monitoring Suppl (ACCU-CHEK GUIDE ME) w/Device KIT see administration instructions.   Cholecalciferol (D3 HIGH POTENCY) 50 MCG (2000 UT) CAPS Take 2,000 Units by mouth daily.    diclofenac Sodium (VOLTAREN) 1 % GEL as directed applied topically 4 times a day   ferrous sulfate 325 (65 FE) MG tablet Take 325 mg by mouth daily with breakfast.   gabapentin (NEURONTIN) 300 MG capsule Take 300 mg by mouth 2 (two) times daily.   glipiZIDE  (GLUCOTROL  XL) 2.5 MG 24 hr tablet Take 2.5 mg by mouth daily.   ibuprofen (ADVIL) 800 MG tablet Take 800 mg by mouth every 8 (eight) hours as needed. Per patient alternating between Advil and Tylenol    linagliptin (TRADJENTA) 5 MG TABS tablet Take 5 mg by mouth daily.   Multiple Vitamin (MULTIVITAMIN) capsule Take 2 capsules by mouth daily.   MYRBETRIQ 25 MG TB24 tablet Take 25 mg by mouth daily.   RANOLAZINE ER PO Take 375 mg by mouth 2 (two) times daily.   TURMERIC PO Take 1 capsule by mouth daily as needed (arthritis pain). (Patient taking differently: Take 1 capsule by mouth daily.)   [DISCONTINUED] amLODipine (NORVASC) 10 MG tablet Take 1 tablet by mouth daily.   [DISCONTINUED] lisinopril (ZESTRIL) 2.5 MG tablet Take 2.5 mg by mouth daily.   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  Tobacco Use   Smoking status: Former    Current packs/day: 0.00    Average packs/day: 0.3 packs/day for 15.0 years (3.8 ttl pk-yrs)    Types: Cigarettes    Start date: 04/01/1975    Quit date: 03/31/1990    Years since quitting: 33.4   Smokeless tobacco: Never  Substance and Sexual Activity   Alcohol use: No    Comment: ocassional   Drug use: No   Sexual activity: Not on file  Other Topics Concern   Not on file  Social History Narrative   2 caffeine drinks daily  Social Drivers of Corporate investment banker Strain: Not on file  Food Insecurity: Not on file  Transportation Needs: Not on file  Physical Activity: Not on file  Stress: Not on file  Social Connections: Unknown (09/28/2022)   Received from [employer]   Social Network    Social Network: Not on file     Family History: The patient's family history includes Crohn's disease in her brother, brother, and brother; Diabetes in an other family member; Heart attack in her father; Heart disease in her mother; Ovarian cancer (age of onset: 26) in her cousin. There is no history of Colon cancer.  ROS:   Please see the history of present illness.     All other systems reviewed and are negative.  EKGs/Labs/Other Studies Reviewed:    The following studies were reviewed today:       Recent Labs: No results found for requested labs within last 365 days.  Recent Lipid Panel    Component Value Date/Time   CHOL 190 07/17/2022 1100   TRIG 144 07/17/2022 1100   HDL 71 07/17/2022 1100   CHOLHDL 2.7 07/17/2022 1100   CHOLHDL 2.3 03/10/2016 1008   VLDL 25 03/10/2016 1008   LDLCALC 94 07/17/2022 1100     Risk Assessment/Calculations:                Physical Exam:    VS:  BP 112/80   Pulse 84     Wt Readings from Last 3 Encounters:  07/27/23 153 lb (69.4 kg)  07/17/22 149 lb 3.2 oz (67.7 kg)  05/15/22 152 lb (68.9 kg)     GEN:  Well nourished, well developed in no acute distress HEENT:  Normal NECK: No JVD; No carotid bruits LYMPHATICS: No lymphadenopathy CARDIAC: RRR, no murmurs, rubs, gallops RESPIRATORY:  Clear to auscultation without rales, wheezing or rhonchi  ABDOMEN: Soft, non-tender, non-distended MUSCULOSKELETAL:  No edema; No deformity  SKIN: Warm and dry NEUROLOGIC:  Alert and oriented x 3 PSYCHIATRIC:  Normal affect   ASSESSMENT:    1. Syncope, unspecified syncope type   2. Essential hypertension   3. Coronary artery disease involving native coronary artery of native heart without angina pectoris   4. Pure hypercholesterolemia    PLAN:    In order of problems listed above:  Syncope an collapse - passed out in the setting of dehydration and NTG likely resulting in hypotension - she was told she was significantly dehydrated - no further episodes - will hold off on heart monitor for now   CAD Prior remote PCI 2004 Last nuclear stress test nonischemic (2019) - risk factor modification - apparently heart cath in [place] without PCI - continue antiplatelet and statin - heart cath 08/2023 in [place] with patent stent, no PCI   Hypertension - previously taking hydrochlorothiazide and lisinopril - has not been taking anything for BP and BP today 112/80 - will keep BP log before restarting medications   Hyperlipidemia with LDL goal < 70 Continue statin   Follow up in 4 weeks.            Medication Adjustments/Labs and Tests Ordered: Current medicines are reviewed at length with the patient today.  Concerns regarding medicines are outlined above.  Orders Placed This Encounter  Procedures   EKG 12-Lead   No orders of the defined types were placed in this encounter.   Patient Instructions  Medication Instructions:  *If you need a refill on your cardiac medications before your next  appointment, please call your pharmacy*   Lab Work: No labs were ordered during today's visit.  If you have labs (blood work) drawn today and your tests  are completely normal, you will receive your results only by: MyChart Message (if you have MyChart) OR A paper copy in the mail If you have any lab test that is abnormal or we need to change your treatment, we will call you to review the results.   Testing/Procedures: No procedures were ordered during today's visit.    Follow-Up: At [facility], you and your health needs are our priority.  As part of our continuing mission to provide you with exceptional heart care, we have created designated Provider Care Teams.  These Care Teams include your primary Cardiologist (physician) and Advanced Practice Providers (APPs -  Physician Assistants and Nurse Practitioners) who all work together to provide you with the care you need, when you need it.  We recommend signing up for the patient portal called MyChart.  Sign up information is provided on this After Visit Summary.  MyChart is used to connect with patients for Virtual Visits (Telemedicine).  Patients are able to view lab/test results, encounter notes, upcoming appointments, etc.  Non-urgent messages can be sent to your provider as well.   To learn more about what you can do with MyChart, go to [email or web address].    Your next appointment:   1 month(s)  Provider:   Marcie Sever, PA-C          Other Instructions Take your blood pressure daily after 10am. Make sure to record all the readings on your blood pressure log. Bring the blood pressure log with you to your next office visit.    Signed, Lamond Pilot, [place]  09/14/2023 12:18 PM    [facility]

## 2023-09-14 ENCOUNTER — Ambulatory Visit: Attending: Physician Assistant | Admitting: Physician Assistant

## 2023-09-14 ENCOUNTER — Encounter: Payer: Self-pay | Admitting: Physician Assistant

## 2023-09-14 VITALS — BP 112/80 | HR 84

## 2023-09-14 DIAGNOSIS — I10 Essential (primary) hypertension: Secondary | ICD-10-CM

## 2023-09-14 DIAGNOSIS — I2510 Atherosclerotic heart disease of native coronary artery without angina pectoris: Secondary | ICD-10-CM

## 2023-09-14 DIAGNOSIS — R55 Syncope and collapse: Secondary | ICD-10-CM | POA: Diagnosis not present

## 2023-09-14 DIAGNOSIS — E7800 Pure hypercholesterolemia, unspecified: Secondary | ICD-10-CM

## 2023-09-14 NOTE — Patient Instructions (Signed)
 Medication Instructions:  *If you need a refill on your cardiac medications before your next appointment, please call your pharmacy*   Lab Work: No labs were ordered during today's visit.  If you have labs (blood work) drawn today and your tests are completely normal, you will receive your results only by: MyChart Message (if you have MyChart) OR A paper copy in the mail If you have any lab test that is abnormal or we need to change your treatment, we will call you to review the results.   Testing/Procedures: No procedures were ordered during today's visit.    Follow-Up: At [facility], you and your health needs are our priority.  As part of our continuing mission to provide you with exceptional heart care, we have created designated Provider Care Teams.  These Care Teams include your primary Cardiologist (physician) and Advanced Practice Providers (APPs -  Physician Assistants and Nurse Practitioners) who all work together to provide you with the care you need, when you need it.  We recommend signing up for the patient portal called MyChart.  Sign up information is provided on this After Visit Summary.  MyChart is used to connect with patients for Virtual Visits (Telemedicine).  Patients are able to view lab/test results, encounter notes, upcoming appointments, etc.  Non-urgent messages can be sent to your provider as well.   To learn more about what you can do with MyChart, go to [email or web address].    Your next appointment:   1 month(s)  Provider:   Marcie Sever, PA-C          Other Instructions Take your blood pressure daily after 10am. Make sure to record all the readings on your blood pressure log. Bring the blood pressure log with you to your next office visit.

## 2023-09-23 ENCOUNTER — Telehealth: Payer: Self-pay | Admitting: Cardiovascular Disease

## 2023-09-23 DIAGNOSIS — I10 Essential (primary) hypertension: Secondary | ICD-10-CM

## 2023-09-23 NOTE — Telephone Encounter (Signed)
 Patient's daughter Mickel provided patient's cellphone number: [phone].  Called and spoke with patient she reports her BP readings since last office visit:  6/17  - 120/87 6/18  - 140/93 6/19  - 138/90 6/20  - 137/91 6/21  - 120/76 6/22  - 138/91 6/23  - 141/86 6/24  - 147/104 6/25  - 146/103  Rechecked after eating this morning: 128/93.  She reports she does have headaches for the last 3 days, she lies down when they come on to alleviate them.  Patient currently not taking any BP medication.  From last office visit on 09/14/23: Hypertension - previously taking hydrochlorothiazide and lisinopril - has not been taking anything for BP and BP today 112/80 - will keep BP log before restarting medications  Will forward to Jon Hails, PA-C to review and advise.

## 2023-09-23 NOTE — Telephone Encounter (Signed)
 I called patient twice  and both times she answers says hello and then hangs up.   Will try again

## 2023-09-23 NOTE — Telephone Encounter (Signed)
 Patient is following up requesting to speak with nurse to discuss further.

## 2023-09-23 NOTE — Telephone Encounter (Signed)
 Pt c/o BP issue: STAT if pt c/o blurred vision, one-sided weakness or slurred speech.  STAT if BP is GREATER than 180/120 TODAY.  STAT if BP is LESS than 90/60 and SYMPTOMATIC TODAY  1. What is your BP concern?   Patient called to report high BP readings  2. Have you taken any BP medication today?  Not taken  3. What are your last 5 BP readings?  6/17  - 120/87 6/18  - 140/93 6/19  - 138/90 6/20  - 137/91 6/21  - 120/76 6/22  - 138/91 6/23  - 141/86 6/24  - 147/104 6/25  - 146/103  4. Are you having any other symptoms (ex. Dizziness, headache, blurred vision, passed out)?   Extreme headache for the last 3 days  Patient stated she was told not to take his BP medication and is calling to report as requested.   Patient stated she has had an extreme headache for the last 3 days and when she get one she lays down for a couple of hours.

## 2023-09-23 NOTE — Telephone Encounter (Signed)
 Spoke with patient and she states she was waiting for our phone call. She sent in her BP log.  Advised we have not received a response from provider but we did send her readings to be reviewed.   Once provider review we will give her a call back

## 2023-09-23 NOTE — Telephone Encounter (Signed)
 Please return call to [phone].

## 2023-09-25 MED ORDER — AMLODIPINE BESYLATE 2.5 MG PO TABS: 2.5000 mg | ORAL_TABLET | Freq: Every day | ORAL | 3 refills | Status: DC

## 2023-09-25 NOTE — Telephone Encounter (Signed)
 Attempted to call patient but when she answered the phone she said hello and then no response. Call ended and tried again, no answer.  Called patient's daughter Mandy and left a message informing her Rx for amlodipine has been sent to [facility] pharmacy. Monitor BP and give update in 1 week. Requested callback to discuss.

## 2023-09-25 NOTE — Telephone Encounter (Signed)
 Give recent AKI, I am hesitant to restart hydrochlorothiazide or lisinopril without labs. We can start 2.5 mg amlodipine and continue check BP.   Thanks Angie

## 2023-09-25 NOTE — Telephone Encounter (Signed)
 Pt's daughter Mandy is requesting a callback at [phone] regarding her wanting to f/u on recent calls, BP and a plan. She feels no one is moving fast enough and she's concerned due to her mothers health. Please advise

## 2023-10-04 NOTE — Progress Notes (Unsigned)
 Cardiology Office Note:    Date:  10/07/2023   ID:  Evelyn Morrison, DOB [birth year], MRN [id number]  PCP:  Catalina Bare, MD    [facility] Providers Cardiologist:  Ozell Fell, MD Cardiology APP:  Madie Jon Garre, PA     Referring MD: Catalina Bare, MD   Chief Complaint  Patient presents with   Follow-up    HTN    History of Present Illness:    Evelyn Morrison is a 83 y.o. female with a hx of esophageal stricture, DM2, HTN, CAD s/p remote MI with PCI in 2004. Multiple subsequent stress tests negative for ischemia (last nuc 2019).   She recently traveled to [place] and unfortunately had a syncopal spell and was admitted 08/21/2023 - 08/26/2023 and had a heart catheterization that did not result in PCI. She was in an uber and had chest pain prompting NTG x 1 along with ACEI-hydrochlorothiazide without good breakfast.   When I saw her in June she had ran out of lisinopril-HCTZ, was not taking amlodipine , was not taking any antihypertensives.  She takes nitroglycerin tablets approximately every 2 months.  I felt her syncope was due to dehydration and hypotension.  She has had no further episodes and opted to defer heart monitor.  sCr remains elevated on BMP.  She presents today with BP log that is generally well controlled - 116-146 systolic BP, primarily in the 869d. No further syncope. She is taking 800 mg advil daily. Started gabapentin twice daily but feeling very tired during the day. No chest pain, euvolemic.   Presents again with her daughter who helps with history.    Past Medical History:  Diagnosis Date   Anemia    Anxiety    Arthritis    CAD (coronary artery disease)    Myoview  12/17: EF 66, apical and apical lateral defect - likely attenuation artifact, no ischemia; Low Risk // Myoview  3/19:  EF 74, no ischemia or scar; low risk    Cataract, diabetic (HCC)    Chest pain    Chronic kidney disease    stage 3   Constipation    Contusion of  arm    Diabetes mellitus    Diabetic retinopathy    DM (diabetes mellitus) (HCC)    Dyspnea    SOb on exertion   Esophageal stricture    Family history of ovarian cancer    Fatigue    Glaucoma    Heart burn    Hiatal hernia    History of echocardiogram    Echo 3/19:  Mild LVH, EF 60-65, no RWMA, Gr 1 DD, PASP 34   HLD (hyperlipidemia)    Hypertension    Joint stiffness of hand    Myocardial infarction (HCC)    2004, stent   Urinary incontinence    Vitamin D deficiency     Past Surgical History:  Procedure Laterality Date   adenosine cardillite  05/16/05   negative signif. myocardial ischemic   BREAST LUMPECTOMY WITH RADIOACTIVE SEED LOCALIZATION Right 07/29/2019   Procedure: RIGHT BREAST LUMPECTOMY WITH RADIOACTIVE SEED LOCALIZATION;  Surgeon: Aron Shoulders, MD;  Location: [facility];  Service: General;  Laterality: Right;   CARDIAC CATHETERIZATION  10/04   s/p stent LV 45%   CATARACT EXTRACTION     bilateral   CESAREAN SECTION  84, 86   ectopic pregnancy   CHOLECYSTECTOMY  1998   CORONARY ANGIOPLASTY WITH STENT PLACEMENT  10/04   PARTIAL HYSTERECTOMY     RE-EXCISION OF  BREAST LUMPECTOMY Right 08/31/2019   Procedure: RE-EXCISION OF RIGHT BREAST LUMPECTOMY;  Surgeon: Aron Shoulders, MD;  Location: [facility];  Service: General;  Laterality: Right;    Current Medications: Current Meds  Medication Sig   Accu-Chek FastClix Lancets MISC 1 each by Other route 3 (three) times daily.   ACCU-CHEK GUIDE test strip 1 each by Other route as needed.   acetaminophen  (TYLENOL ) 500 MG tablet Take 500-1,000 mg by mouth every 6 (six) hours as needed for moderate pain.    albuterol  (PROVENTIL  HFA;VENTOLIN  HFA) 108 (90 Base) MCG/ACT inhaler Inhale 1-2 puffs into the lungs every 4 (four) hours as needed for wheezing or shortness of breath.   amLODipine  (NORVASC ) 5 MG tablet Take 1 tablet (5 mg total) by mouth daily.   aspirin 81 MG EC tablet Take 81 mg by mouth every morning.     atorvastatin (LIPITOR) 40 MG tablet Take 40 mg by mouth daily.   Biotin 89999 MCG TABS Take by mouth.   Blood Glucose Monitoring Suppl (ACCU-CHEK GUIDE ME) w/Device KIT see administration instructions.   Cholecalciferol (D3 HIGH POTENCY) 50 MCG (2000 UT) CAPS Take 2,000 Units by mouth daily.    diclofenac Sodium (VOLTAREN) 1 % GEL as directed applied topically 4 times a day   ferrous sulfate 325 (65 FE) MG tablet Take 325 mg by mouth daily with breakfast.   gabapentin (NEURONTIN) 300 MG capsule Take 300 mg by mouth 2 (two) times daily. (Patient taking differently: Take 300 mg by mouth at bedtime. Take 300 mg nightly)   glipiZIDE  (GLUCOTROL  XL) 2.5 MG 24 hr tablet Take 2.5 mg by mouth daily.   ibuprofen (ADVIL) 800 MG tablet Take 800 mg by mouth every 8 (eight) hours as needed. Per patient alternating between Advil and Tylenol    Multiple Vitamin (MULTIVITAMIN) capsule Take 2 capsules by mouth daily.   MYRBETRIQ 25 MG TB24 tablet Take 25 mg by mouth daily.   nitroGLYCERIN (NITROSTAT) 0.4 MG SL tablet Place 0.4 mg under the tongue every 5 (five) minutes as needed for chest pain (3 doses max).    TURMERIC PO Take 1 capsule by mouth daily as needed (arthritis pain).   [DISCONTINUED] amLODipine  (NORVASC ) 2.5 MG tablet Take 1 tablet (2.5 mg total) by mouth daily.   [DISCONTINUED] Bisoprolol Fumarate 2.5 MG TABS Take 2.5 mg by mouth daily.   Current Facility-Administered Medications for the 10/07/23 encounter (Office Visit) with Madie Jon Garre, PA  Medication   0.9 %  sodium chloride  infusion     Allergies:   Penicillins, Ibuprofen, Lactose intolerance (gi), Milk-related compounds, Nyquil multi-symptom [pseudoeph-doxylamine-dm-apap], Penicillin g, and Latex   Social History   Socioeconomic History   Marital status: Married    Spouse name: Not on file   Number of children: 5    Years of education: Not on file   Highest education level: Not on file  Occupational History   Occupation: Arts development officer    Comment: Retired  Tobacco Use   Smoking status: Former    Current packs/day: 0.00    Average packs/day: 0.3 packs/day for 15.0 years (3.8 ttl pk-yrs)    Types: Cigarettes    Start date: 04/01/1975    Quit date: 03/31/1990    Years since quitting: 33.5   Smokeless tobacco: Never  Substance and Sexual Activity   Alcohol use: No    Comment: ocassional   Drug use: No   Sexual activity: Not on file  Other Topics Concern   Not on file  Social History Narrative   2 caffeine drinks daily    Social Drivers of Corporate investment banker Strain: Not on file  Food Insecurity: Not on file  Transportation Needs: Not on file  Physical Activity: Not on file  Stress: Not on file  Social Connections: Unknown (09/28/2022)   Received from [employer]   Social Network    Social Network: Not on file     Family History: The patient's family history includes Crohn's disease in her brother, brother, and brother; Diabetes in an other family member; Heart attack in her father; Heart disease in her mother; Ovarian cancer (age of onset: 38) in her cousin. There is no history of Colon cancer.  ROS:   Please see the history of present illness.     All other systems reviewed and are negative.  EKGs/Labs/Other Studies Reviewed:    The following studies were reviewed today:       Recent Labs: No results found for requested labs within last 365 days.  Recent Lipid Panel    Component Value Date/Time   CHOL 190 07/17/2022 1100   TRIG 144 07/17/2022 1100   HDL 71 07/17/2022 1100   CHOLHDL 2.7 07/17/2022 1100   CHOLHDL 2.3 03/10/2016 1008   VLDL 25 03/10/2016 1008   LDLCALC 94 07/17/2022 1100     Risk Assessment/Calculations:                Physical Exam:    VS:  BP 110/80   Pulse 86   Wt 141 lb 6.4 oz (64.1 kg)   SpO2 97%   BMI 26.72 kg/m     Wt Readings from Last 3 Encounters:  10/07/23 141 lb 6.4 oz (64.1 kg)  07/27/23 153 lb (69.4 kg)  07/17/22 149 lb 3.2 oz  (67.7 kg)     GEN:  Well nourished, well developed in no acute distress HEENT: Normal NECK: No JVD; No carotid bruits LYMPHATICS: No lymphadenopathy CARDIAC: RRR, no murmurs, rubs, gallops RESPIRATORY:  Clear to auscultation without rales, wheezing or rhonchi  ABDOMEN: Soft, non-tender, non-distended MUSCULOSKELETAL:  No edema; No deformity  SKIN: Warm and dry NEUROLOGIC:  Alert and oriented x 3 PSYCHIATRIC:  Normal affect   ASSESSMENT:    1. Syncope, unspecified syncope type   2. Essential hypertension   3. Coronary artery disease involving native coronary artery of native heart without angina pectoris   4. AKI (acute kidney injury) (HCC)   5. Hyperlipidemia with target LDL less than 70    PLAN:    In order of problems listed above:  CAD Prior remote PCI 2004 Last nuclear stress test nonischemic (2019) - risk factor modification - heart cath in [place] without PCI, was told patent stent - continue antiplatelet and statin   Syncope and collapse - Felt due to dehydration and hypotension, initially deferred heart monitor - not falling anymore - no further syncope   Hypertension CKD - BP log shows generally controlled BP, but generally in the 130s on 2.5 mg amlodipine  - given renal function and DM, will attempt slightly lower BP to keep out of the 140s - given ibuprofen use and renal function, will increase amlodipine  to 5 mg and stop ACEI-hydrochlorothiazide, euvolemic today - stop ibuprofen and recheck BMP in 2-3 weeks before they leave on vacation - would like for her to be on low does ACEI/ARB, but will need to follow BMP - recheck in 3 weeks off of gabapentin   Hyperlipidemia with LDL goal < 70  Continue statin   OA - recommended to reduce gabapentin to only nightly since daytime dose is making her very lethargic, unable to get out of bed - her MSK pain is generally at night - I asked her to reduce advil to 1-2 times weekly when needed, continue 1g tylenol ,  and gabapentin at night - continue voltarin gel - will repeat BMP once she has reduced ibuprofen   Follow up with me in 6 months.         Medication Adjustments/Labs and Tests Ordered: Current medicines are reviewed at length with the patient today.  Concerns regarding medicines are outlined above.  Orders Placed This Encounter  Procedures   Basic metabolic panel with GFR   Meds ordered this encounter  Medications   amLODipine  (NORVASC ) 5 MG tablet    Sig: Take 1 tablet (5 mg total) by mouth daily.    Dispense:  90 tablet    Refill:  3    Patient Instructions  Medication Instructions:  Increase Amlodipine  5 mg daily Stop Lisinopril/hydrochlorothiazide 20-12.5 mg as directed Stop Bisoprolol 2.5 mg  Take 300 mg of Gabapentin nightly Take 1,000 mg of Tylenol  daily for Osteoarthritis   *If you need a refill on your cardiac medications before your next appointment, please call your pharmacy*  Lab Work: BMET at your convenience  Testing/Procedures: NONE ordered at this time of appointment   Follow-Up: At [facility], you and your health needs are our priority.  As part of our continuing mission to provide you with exceptional heart care, our providers are all part of one team.  This team includes your primary Cardiologist (physician) and Advanced Practice Providers or APPs (Physician Assistants and Nurse Practitioners) who all work together to provide you with the care you need, when you need it.  Your next appointment:   6 month(s)  Provider:   Jon Hails, PA-C          We recommend signing up for the patient portal called MyChart.  Sign up information is provided on this After Visit Summary.  MyChart is used to connect with patients for Virtual Visits (Telemedicine).  Patients are able to view lab/test results, encounter notes, upcoming appointments, etc.  Non-urgent messages can be sent to your provider as well.   To learn more about what you can do with  MyChart, go to [email or web address].           Signed, Jon Nat Hails, [place]  10/07/2023 10:13 AM    [facility]

## 2023-10-05 DIAGNOSIS — E119 Type 2 diabetes mellitus without complications: Secondary | ICD-10-CM | POA: Diagnosis not present

## 2023-10-05 DIAGNOSIS — I10 Essential (primary) hypertension: Secondary | ICD-10-CM | POA: Diagnosis not present

## 2023-10-05 DIAGNOSIS — N1831 Chronic kidney disease, stage 3a: Secondary | ICD-10-CM | POA: Diagnosis not present

## 2023-10-05 DIAGNOSIS — I25118 Atherosclerotic heart disease of native coronary artery with other forms of angina pectoris: Secondary | ICD-10-CM | POA: Diagnosis not present

## 2023-10-05 DIAGNOSIS — E782 Mixed hyperlipidemia: Secondary | ICD-10-CM | POA: Diagnosis not present

## 2023-10-05 DIAGNOSIS — M1611 Unilateral primary osteoarthritis, right hip: Secondary | ICD-10-CM | POA: Diagnosis not present

## 2023-10-07 ENCOUNTER — Ambulatory Visit: Attending: Physician Assistant | Admitting: Physician Assistant

## 2023-10-07 ENCOUNTER — Encounter: Payer: Self-pay | Admitting: Physician Assistant

## 2023-10-07 VITALS — BP 110/80 | HR 86 | Wt 141.4 lb

## 2023-10-07 DIAGNOSIS — R55 Syncope and collapse: Secondary | ICD-10-CM | POA: Diagnosis not present

## 2023-10-07 DIAGNOSIS — I10 Essential (primary) hypertension: Secondary | ICD-10-CM | POA: Diagnosis not present

## 2023-10-07 DIAGNOSIS — E785 Hyperlipidemia, unspecified: Secondary | ICD-10-CM

## 2023-10-07 DIAGNOSIS — N179 Acute kidney failure, unspecified: Secondary | ICD-10-CM

## 2023-10-07 DIAGNOSIS — I2510 Atherosclerotic heart disease of native coronary artery without angina pectoris: Secondary | ICD-10-CM | POA: Diagnosis not present

## 2023-10-07 MED ORDER — AMLODIPINE BESYLATE 5 MG PO TABS: 5.0000 mg | ORAL_TABLET | Freq: Every day | ORAL | 3 refills | Status: AC

## 2023-10-07 NOTE — Patient Instructions (Signed)
 Medication Instructions:  Increase Amlodipine  5 mg daily Stop Lisinopril/hydrochlorothiazide 20-12.5 mg as directed Stop Bisoprolol 2.5 mg  Take 300 mg of Gabapentin nightly Take 1,000 mg of Tylenol  daily for Osteoarthritis   *If you need a refill on your cardiac medications before your next appointment, please call your pharmacy*  Lab Work: BMET at your convenience  Testing/Procedures: NONE ordered at this time of appointment   Follow-Up: At [facility], you and your health needs are our priority.  As part of our continuing mission to provide you with exceptional heart care, our providers are all part of one team.  This team includes your primary Cardiologist (physician) and Advanced Practice Providers or APPs (Physician Assistants and Nurse Practitioners) who all work together to provide you with the care you need, when you need it.  Your next appointment:   6 month(s)  Provider:   Jon Hails, PA-C          We recommend signing up for the patient portal called MyChart.  Sign up information is provided on this After Visit Summary.  MyChart is used to connect with patients for Virtual Visits (Telemedicine).  Patients are able to view lab/test results, encounter notes, upcoming appointments, etc.  Non-urgent messages can be sent to your provider as well.   To learn more about what you can do with MyChart, go to [email or web address].

## 2023-10-29 DIAGNOSIS — R55 Syncope and collapse: Secondary | ICD-10-CM | POA: Diagnosis not present

## 2023-10-29 DIAGNOSIS — N179 Acute kidney failure, unspecified: Secondary | ICD-10-CM | POA: Diagnosis not present

## 2023-10-29 DIAGNOSIS — E785 Hyperlipidemia, unspecified: Secondary | ICD-10-CM | POA: Diagnosis not present

## 2023-10-29 DIAGNOSIS — I10 Essential (primary) hypertension: Secondary | ICD-10-CM | POA: Diagnosis not present

## 2023-10-29 DIAGNOSIS — I2510 Atherosclerotic heart disease of native coronary artery without angina pectoris: Secondary | ICD-10-CM | POA: Diagnosis not present

## 2023-10-30 ENCOUNTER — Ambulatory Visit: Payer: Self-pay | Admitting: Physician Assistant

## 2023-10-30 LAB — BASIC METABOLIC PANEL WITH GFR
BUN/Creatinine Ratio: 10 — ABNORMAL LOW (ref 12–28)
BUN: 12 mg/dL (ref 8–27)
CO2: 20 mmol/L (ref 20–29)
Calcium: 9.6 mg/dL (ref 8.7–10.3)
Chloride: 106 mmol/L (ref 96–106)
Creatinine, Ser: 1.19 mg/dL — ABNORMAL HIGH (ref 0.57–1.00)
Glucose: 91 mg/dL (ref 70–99)
Potassium: 4.2 mmol/L (ref 3.5–5.2)
Sodium: 144 mmol/L (ref 134–144)
eGFR: 46 mL/min/1.73 — ABNORMAL LOW (ref >59)

## 2023-12-02 DIAGNOSIS — E782 Mixed hyperlipidemia: Secondary | ICD-10-CM | POA: Diagnosis not present

## 2023-12-02 DIAGNOSIS — M1611 Unilateral primary osteoarthritis, right hip: Secondary | ICD-10-CM | POA: Diagnosis not present

## 2023-12-02 DIAGNOSIS — I10 Essential (primary) hypertension: Secondary | ICD-10-CM | POA: Diagnosis not present

## 2023-12-02 DIAGNOSIS — E119 Type 2 diabetes mellitus without complications: Secondary | ICD-10-CM | POA: Diagnosis not present

## 2023-12-02 DIAGNOSIS — K219 Gastro-esophageal reflux disease without esophagitis: Secondary | ICD-10-CM | POA: Diagnosis not present

## 2023-12-02 DIAGNOSIS — I25118 Atherosclerotic heart disease of native coronary artery with other forms of angina pectoris: Secondary | ICD-10-CM | POA: Diagnosis not present

## 2023-12-02 DIAGNOSIS — N1831 Chronic kidney disease, stage 3a: Secondary | ICD-10-CM | POA: Diagnosis not present

## 2023-12-23 DIAGNOSIS — I25118 Atherosclerotic heart disease of native coronary artery with other forms of angina pectoris: Secondary | ICD-10-CM | POA: Diagnosis not present

## 2023-12-23 DIAGNOSIS — K219 Gastro-esophageal reflux disease without esophagitis: Secondary | ICD-10-CM | POA: Diagnosis not present

## 2023-12-23 DIAGNOSIS — E119 Type 2 diabetes mellitus without complications: Secondary | ICD-10-CM | POA: Diagnosis not present

## 2023-12-23 DIAGNOSIS — Z0001 Encounter for general adult medical examination with abnormal findings: Secondary | ICD-10-CM | POA: Diagnosis not present

## 2023-12-23 DIAGNOSIS — E782 Mixed hyperlipidemia: Secondary | ICD-10-CM | POA: Diagnosis not present

## 2023-12-23 DIAGNOSIS — M1611 Unilateral primary osteoarthritis, right hip: Secondary | ICD-10-CM | POA: Diagnosis not present

## 2023-12-23 DIAGNOSIS — I10 Essential (primary) hypertension: Secondary | ICD-10-CM | POA: Diagnosis not present

## 2023-12-23 DIAGNOSIS — N1831 Chronic kidney disease, stage 3a: Secondary | ICD-10-CM | POA: Diagnosis not present

## 2024-01-22 DIAGNOSIS — L309 Dermatitis, unspecified: Secondary | ICD-10-CM | POA: Diagnosis not present

## 2024-02-05 DIAGNOSIS — L239 Allergic contact dermatitis, unspecified cause: Secondary | ICD-10-CM | POA: Diagnosis not present

## 2024-02-12 DIAGNOSIS — M1611 Unilateral primary osteoarthritis, right hip: Secondary | ICD-10-CM | POA: Diagnosis not present

## 2024-02-12 DIAGNOSIS — I10 Essential (primary) hypertension: Secondary | ICD-10-CM | POA: Diagnosis not present

## 2024-02-12 DIAGNOSIS — I25118 Atherosclerotic heart disease of native coronary artery with other forms of angina pectoris: Secondary | ICD-10-CM | POA: Diagnosis not present

## 2024-02-12 DIAGNOSIS — E782 Mixed hyperlipidemia: Secondary | ICD-10-CM | POA: Diagnosis not present

## 2024-02-12 DIAGNOSIS — N1831 Chronic kidney disease, stage 3a: Secondary | ICD-10-CM | POA: Diagnosis not present

## 2024-02-12 DIAGNOSIS — K219 Gastro-esophageal reflux disease without esophagitis: Secondary | ICD-10-CM | POA: Diagnosis not present

## 2024-02-12 DIAGNOSIS — E119 Type 2 diabetes mellitus without complications: Secondary | ICD-10-CM | POA: Diagnosis not present
